# Patient Record
Sex: Female | Born: 1960
Health system: Southern US, Community
[De-identification: ages and names within clinical notes are randomized; demographics above are authoritative.]

## PROBLEM LIST (undated history)

## (undated) DIAGNOSIS — IMO0001 Reserved for inherently not codable concepts without codable children: Secondary | ICD-10-CM

## (undated) DIAGNOSIS — J019 Acute sinusitis, unspecified: Secondary | ICD-10-CM

## (undated) DIAGNOSIS — J301 Allergic rhinitis due to pollen: Secondary | ICD-10-CM

## (undated) DIAGNOSIS — Z0000 Encounter for general adult medical examination without abnormal findings: Secondary | ICD-10-CM

## (undated) DIAGNOSIS — E559 Vitamin D deficiency, unspecified: Secondary | ICD-10-CM

## (undated) DIAGNOSIS — R1013 Epigastric pain: Secondary | ICD-10-CM

## (undated) DIAGNOSIS — R5383 Other fatigue: Secondary | ICD-10-CM

## (undated) DIAGNOSIS — M2550 Pain in unspecified joint: Secondary | ICD-10-CM

## (undated) DIAGNOSIS — Z86010 Personal history of colonic polyps: Secondary | ICD-10-CM

## (undated) DIAGNOSIS — E669 Obesity, unspecified: Secondary | ICD-10-CM

## (undated) DIAGNOSIS — T7840XA Allergy, unspecified, initial encounter: Secondary | ICD-10-CM

## (undated) DIAGNOSIS — F32A Depression, unspecified: Secondary | ICD-10-CM

## (undated) DIAGNOSIS — J4530 Mild persistent asthma, uncomplicated: Secondary | ICD-10-CM

## (undated) DIAGNOSIS — N951 Menopausal and female climacteric states: Secondary | ICD-10-CM

## (undated) DIAGNOSIS — E28319 Asymptomatic premature menopause: Secondary | ICD-10-CM

## (undated) DIAGNOSIS — M79609 Pain in unspecified limb: Secondary | ICD-10-CM

## (undated) DIAGNOSIS — F329 Major depressive disorder, single episode, unspecified: Secondary | ICD-10-CM

## (undated) DIAGNOSIS — J069 Acute upper respiratory infection, unspecified: Secondary | ICD-10-CM

## (undated) DIAGNOSIS — J309 Allergic rhinitis, unspecified: Secondary | ICD-10-CM

## (undated) DIAGNOSIS — T781XXD Other adverse food reactions, not elsewhere classified, subsequent encounter: Secondary | ICD-10-CM

## (undated) DIAGNOSIS — E785 Hyperlipidemia, unspecified: Secondary | ICD-10-CM

## (undated) DIAGNOSIS — F331 Major depressive disorder, recurrent, moderate: Secondary | ICD-10-CM

## (undated) DIAGNOSIS — H1010 Acute atopic conjunctivitis, unspecified eye: Secondary | ICD-10-CM

## (undated) DIAGNOSIS — J45909 Unspecified asthma, uncomplicated: Secondary | ICD-10-CM

## (undated) DIAGNOSIS — M1990 Unspecified osteoarthritis, unspecified site: Secondary | ICD-10-CM

## (undated) DIAGNOSIS — M722 Plantar fascial fibromatosis: Secondary | ICD-10-CM

## (undated) DIAGNOSIS — J310 Chronic rhinitis: Secondary | ICD-10-CM

## (undated) DIAGNOSIS — H9311 Tinnitus, right ear: Secondary | ICD-10-CM

## (undated) DIAGNOSIS — R011 Cardiac murmur, unspecified: Secondary | ICD-10-CM

## (undated) DIAGNOSIS — M810 Age-related osteoporosis without current pathological fracture: Secondary | ICD-10-CM

## (undated) DIAGNOSIS — J988 Other specified respiratory disorders: Secondary | ICD-10-CM

## (undated) DIAGNOSIS — R42 Dizziness and giddiness: Secondary | ICD-10-CM

## (undated) HISTORY — DX: Menopausal and female climacteric states: N95.1

## (undated) HISTORY — DX: Mild persistent asthma, uncomplicated: J45.30

## (undated) HISTORY — DX: Encounter for general adult medical examination without abnormal findings: Z00.00

## (undated) HISTORY — DX: Tinnitus, right ear: H93.11

## (undated) HISTORY — DX: Allergic rhinitis, unspecified: J30.9

## (undated) HISTORY — PX: KNEE ARTHROPLASTY: SHX992

## (undated) HISTORY — DX: Chronic rhinitis: J31.0

## (undated) HISTORY — DX: Unspecified osteoarthritis, unspecified site: M19.90

## (undated) HISTORY — DX: Obesity, unspecified: E66.9

## (undated) HISTORY — DX: Plantar fascial fibromatosis: M72.2

## (undated) HISTORY — DX: Epigastric pain: R10.13

## (undated) HISTORY — DX: Other adverse food reactions, not elsewhere classified, subsequent encounter: T78.1XXD

## (undated) HISTORY — DX: Asymptomatic premature menopause: E28.319

## (undated) HISTORY — DX: Pain in unspecified joint: M25.50

## (undated) HISTORY — DX: Other specified respiratory disorders: J98.8

## (undated) HISTORY — DX: Other fatigue: R53.83

## (undated) HISTORY — DX: Acute upper respiratory infection, unspecified: J06.9

## (undated) HISTORY — DX: Cardiac murmur, unspecified: R01.1

## (undated) HISTORY — DX: Unspecified asthma, uncomplicated: J45.909

## (undated) HISTORY — DX: Major depressive disorder, single episode, unspecified: F32.9

## (undated) HISTORY — DX: Allergy, unspecified, initial encounter: T78.40XA

## (undated) HISTORY — DX: Acute atopic conjunctivitis, unspecified eye: H10.10

## (undated) HISTORY — DX: Major depressive disorder, recurrent, moderate: F33.1

## (undated) HISTORY — DX: Pain in unspecified limb: M79.609

## (undated) HISTORY — DX: Dizziness and giddiness: R42

## (undated) HISTORY — DX: Personal history of colonic polyps: Z86.010

## (undated) HISTORY — DX: Acute sinusitis, unspecified: J01.90

## (undated) HISTORY — DX: Reserved for inherently not codable concepts without codable children: IMO0001

## (undated) HISTORY — DX: Hyperlipidemia, unspecified: E78.5

## (undated) HISTORY — DX: Depression, unspecified: F32.A

## (undated) HISTORY — DX: Vitamin D deficiency, unspecified: E55.9

## (undated) HISTORY — DX: Age-related osteoporosis without current pathological fracture: M81.0

## (undated) HISTORY — DX: Allergic rhinitis due to pollen: J30.1

---

## 1982-06-27 HISTORY — PX: WISDOM TOOTH EXTRACTION: SHX21

## 1996-06-27 HISTORY — PX: KNEE SURGERY: SHX244

## 2003-08-01 ENCOUNTER — Other Ambulatory Visit: Admission: RE | Admit: 2003-08-01 | Discharge: 2003-08-01 | Payer: Self-pay | Admitting: *Deleted

## 2004-08-31 ENCOUNTER — Other Ambulatory Visit: Admission: RE | Admit: 2004-08-31 | Discharge: 2004-08-31 | Payer: Self-pay | Admitting: Obstetrics and Gynecology

## 2004-10-18 ENCOUNTER — Ambulatory Visit (HOSPITAL_COMMUNITY): Admission: RE | Admit: 2004-10-18 | Discharge: 2004-10-18 | Payer: Self-pay | Admitting: Obstetrics and Gynecology

## 2004-11-23 ENCOUNTER — Encounter: Admission: RE | Admit: 2004-11-23 | Discharge: 2004-11-23 | Payer: Self-pay | Admitting: Obstetrics and Gynecology

## 2008-01-09 ENCOUNTER — Encounter: Payer: Self-pay | Admitting: Family Medicine

## 2008-01-25 ENCOUNTER — Encounter: Payer: Self-pay | Admitting: Family Medicine

## 2008-01-25 ENCOUNTER — Encounter (INDEPENDENT_AMBULATORY_CARE_PROVIDER_SITE_OTHER): Payer: Self-pay | Admitting: *Deleted

## 2008-01-25 LAB — CONVERTED CEMR LAB
Free T4: 7.8 ng/dL
Glucose, Bld: 83 mg/dL
LDL Cholesterol: 24 mg/dL
TSH: 1.623 microintl units/mL
Triglycerides: 120 mg/dL

## 2008-07-09 ENCOUNTER — Ambulatory Visit: Payer: Self-pay | Admitting: Family Medicine

## 2008-07-09 DIAGNOSIS — N951 Menopausal and female climacteric states: Secondary | ICD-10-CM | POA: Insufficient documentation

## 2008-07-09 DIAGNOSIS — E669 Obesity, unspecified: Secondary | ICD-10-CM

## 2008-07-09 HISTORY — DX: Menopausal and female climacteric states: N95.1

## 2008-07-09 HISTORY — DX: Obesity, unspecified: E66.9

## 2008-07-16 ENCOUNTER — Encounter (INDEPENDENT_AMBULATORY_CARE_PROVIDER_SITE_OTHER): Payer: Self-pay | Admitting: *Deleted

## 2008-07-18 ENCOUNTER — Ambulatory Visit: Payer: Self-pay | Admitting: Family Medicine

## 2008-08-15 ENCOUNTER — Ambulatory Visit: Payer: Self-pay | Admitting: Family Medicine

## 2008-08-15 DIAGNOSIS — R109 Unspecified abdominal pain: Secondary | ICD-10-CM

## 2008-10-09 ENCOUNTER — Ambulatory Visit: Payer: Self-pay | Admitting: Family Medicine

## 2008-10-09 DIAGNOSIS — J309 Allergic rhinitis, unspecified: Secondary | ICD-10-CM | POA: Insufficient documentation

## 2008-10-09 DIAGNOSIS — J019 Acute sinusitis, unspecified: Secondary | ICD-10-CM

## 2008-10-09 DIAGNOSIS — J45909 Unspecified asthma, uncomplicated: Secondary | ICD-10-CM | POA: Insufficient documentation

## 2008-10-09 HISTORY — DX: Unspecified asthma, uncomplicated: J45.909

## 2008-10-09 HISTORY — DX: Allergic rhinitis, unspecified: J30.9

## 2008-10-27 ENCOUNTER — Ambulatory Visit: Payer: Self-pay | Admitting: Family Medicine

## 2008-10-31 ENCOUNTER — Ambulatory Visit: Payer: Self-pay | Admitting: Internal Medicine

## 2008-11-26 ENCOUNTER — Ambulatory Visit: Payer: Self-pay | Admitting: Family Medicine

## 2009-01-23 ENCOUNTER — Ambulatory Visit: Payer: Self-pay | Admitting: Family Medicine

## 2009-02-11 ENCOUNTER — Ambulatory Visit: Payer: Self-pay | Admitting: Family Medicine

## 2009-10-28 ENCOUNTER — Ambulatory Visit: Payer: Self-pay | Admitting: Family Medicine

## 2009-10-28 DIAGNOSIS — M79609 Pain in unspecified limb: Secondary | ICD-10-CM

## 2009-10-28 HISTORY — DX: Pain in unspecified limb: M79.609

## 2009-10-29 ENCOUNTER — Telehealth (INDEPENDENT_AMBULATORY_CARE_PROVIDER_SITE_OTHER): Payer: Self-pay | Admitting: *Deleted

## 2009-10-29 ENCOUNTER — Encounter (INDEPENDENT_AMBULATORY_CARE_PROVIDER_SITE_OTHER): Payer: Self-pay | Admitting: *Deleted

## 2009-10-29 LAB — CONVERTED CEMR LAB
Alkaline Phosphatase: 87 units/L (ref 39–117)
Bilirubin, Direct: 0.1 mg/dL (ref 0.0–0.3)
CO2: 29 meq/L (ref 19–32)
Chloride: 107 meq/L (ref 96–112)
Cholesterol: 221 mg/dL — ABNORMAL HIGH (ref 0–200)
Eosinophils Relative: 4.8 % (ref 0.0–5.0)
GFR calc non Af Amer: 94.57 mL/min (ref >60)
Glucose, Bld: 78 mg/dL (ref 70–99)
HCT: 38.8 % (ref 36.0–46.0)
MCV: 82.4 fL (ref 78.0–100.0)
Monocytes Absolute: 0.4 10*3/uL (ref 0.1–1.0)
Platelets: 389 10*3/uL (ref 150.0–400.0)
Potassium: 4.6 meq/L (ref 3.5–5.1)
RBC: 4.7 M/uL (ref 3.87–5.11)
RDW: 15.7 % — ABNORMAL HIGH (ref 11.5–14.6)
Sodium: 142 meq/L (ref 135–145)
Total Protein: 6.8 g/dL (ref 6.0–8.3)
VLDL: 20.6 mg/dL (ref 0.0–40.0)
WBC: 6.1 10*3/uL (ref 4.5–10.5)

## 2009-11-10 ENCOUNTER — Encounter (INDEPENDENT_AMBULATORY_CARE_PROVIDER_SITE_OTHER): Payer: Self-pay | Admitting: *Deleted

## 2009-11-12 ENCOUNTER — Ambulatory Visit: Payer: Self-pay | Admitting: Internal Medicine

## 2009-11-24 ENCOUNTER — Ambulatory Visit: Payer: Self-pay | Admitting: Family Medicine

## 2009-11-24 DIAGNOSIS — IMO0001 Reserved for inherently not codable concepts without codable children: Secondary | ICD-10-CM | POA: Insufficient documentation

## 2009-11-24 HISTORY — DX: Reserved for inherently not codable concepts without codable children: IMO0001

## 2009-11-26 ENCOUNTER — Ambulatory Visit: Payer: Self-pay | Admitting: Internal Medicine

## 2009-11-26 DIAGNOSIS — Z860100 Personal history of colon polyps, unspecified: Secondary | ICD-10-CM | POA: Insufficient documentation

## 2009-11-26 HISTORY — DX: Personal history of colonic polyps: Z86.010

## 2009-11-26 HISTORY — DX: Personal history of colon polyps, unspecified: Z86.0100

## 2009-11-28 ENCOUNTER — Encounter: Payer: Self-pay | Admitting: Internal Medicine

## 2009-12-14 ENCOUNTER — Telehealth (INDEPENDENT_AMBULATORY_CARE_PROVIDER_SITE_OTHER): Payer: Self-pay | Admitting: *Deleted

## 2009-12-17 ENCOUNTER — Ambulatory Visit: Payer: Self-pay | Admitting: Sports Medicine

## 2009-12-20 ENCOUNTER — Encounter: Payer: Self-pay | Admitting: Family Medicine

## 2010-07-27 NOTE — Letter (Signed)
Summary: [facility] Instructions  [facility]  [address] [place], [place] [address]   Phone: [phone]  Fax: [phone]       Tina Williams    Jul 26, 1960    MRN: [id number]        Procedure Day Tina Williams:  Tina Williams  11/26/09     Arrival Time:  9:00aam     Procedure Time:  10:00am     Location of Procedure:                    _X _  [facility] (4th Floor)                        PREPARATION FOR COLONOSCOPY WITH MOVIPREP   Starting 5 days prior to your procedure  SATURDAY 05/28  do not eat nuts, seeds, popcorn, corn, beans, peas,  salads, or any raw vegetables.  Do not take any fiber supplements (e.g. Metamucil, Citrucel, and Benefiber).  THE DAY BEFORE YOUR PROCEDURE         DATE: [facility] 06/01  1.  Drink clear liquids the entire day-NO SOLID FOOD  2.  Do not drink anything colored red or purple.  Avoid juices with pulp.  No orange juice.  3.  Drink at least 64 oz. (8 glasses) of fluid/clear liquids during the day to prevent dehydration and help the prep work efficiently.  CLEAR LIQUIDS INCLUDE: Water Jello Ice Popsicles Tea (sugar ok, no milk/cream) Powdered fruit flavored drinks Coffee (sugar ok, no milk/cream) Gatorade Juice: apple, white grape, white cranberry  Lemonade Clear bullion, consomm, broth Carbonated beverages (any kind) Strained chicken noodle soup Hard Candy                             4.  In the morning, mix first dose of MoviPrep solution:    Empty 1 Pouch A and 1 Pouch B into the disposable container    Add lukewarm drinking water to the top line of the container. Mix to dissolve    Refrigerate (mixed solution should be used within 24 hrs)  5.  Begin drinking the prep at 5:00 p.m. The MoviPrep container is divided by 4 marks.   Every 15 minutes drink the solution down to the next mark (approximately 8 oz) until the full liter is complete.   6.  Follow completed prep with 16 oz of clear liquid of your choice  (Nothing red or purple).  Continue to drink clear liquids until bedtime.  7.  Before going to bed, mix second dose of MoviPrep solution:    Empty 1 Pouch A and 1 Pouch B into the disposable container    Add lukewarm drinking water to the top line of the container. Mix to dissolve    Refrigerate  THE DAY OF YOUR PROCEDURE      DATE: THURSDAY  06/02  Beginning at 5:00 a.m. (5 hours before procedure):         1. Every 15 minutes, drink the solution down to the next mark (approx 8 oz) until the full liter is complete.  2. Follow completed prep with 16 oz. of clear liquid of your choice.    3. You may drink clear liquids until 8:00am (2 HOURS BEFORE PROCEDURE).   MEDICATION INSTRUCTIONS  Unless otherwise instructed, you should take regular prescription medications with a small sip of water   as early as possible the  morning of your procedure.           OTHER INSTRUCTIONS  You will need a responsible adult at least 50 years of age to accompany you and drive you home.   This person must remain in the waiting room during your procedure.  Wear loose fitting clothing that is easily removed.  Leave jewelry and other valuables at home.  However, you may wish to bring a book to read or  an iPod/MP3 player to listen to music as you wait for your procedure to start.  Remove all body piercing jewelry and leave at home.  Total time from sign-in until discharge is approximately 2-3 hours.  You should go home directly after your procedure and rest.  You can resume normal activities the  day after your procedure.  The day of your procedure you should not:   Drive   Make legal decisions   Operate machinery   Drink alcohol   Return to work  You will receive specific instructions about eating, activities and medications before you leave.    The above instructions have been reviewed and explained to me by Tina Bales RN  Nov 12, 2009 8:45 AM      I fully understand  and can verbalize these instructions _____________________________ Date _________

## 2010-07-27 NOTE — Progress Notes (Signed)
Summary: labs-lmomx2  Phone Note Outgoing Call   Call placed by: Doristine Devoid,  Oct 29, 2009 11:02 AM Call placed to: Patient Summary of Call: total cholesterol and LDL are both elevated.  please have pt start Simvastatin 20mg  at bedtime and recheck LFTs in 6-8 weeks  remainder of labs look great!  Follow-up for Phone Call        left message on machine .........Marland KitchenDoristine Devoid  Oct 29, 2009 11:02 AM   patient returned call left message on machine ........Marland KitchenDoristine Devoid  Oct 29, 2009 2:27 PM   Additional Follow-up for Phone Call Additional follow up Details #1::        Spoke with pt informed of labs and [facility] [place], [place]. lab appt scheduled .Kandice Hams  Oct 30, 2009 8:50 AM  Additional Follow-up by: Kandice Hams,  Oct 30, 2009 8:50 AM    New/Updated Medications: SIMVASTATIN 20 MG TABS (SIMVASTATIN) 1 by mouth at bedtime Prescriptions: SIMVASTATIN 20 MG TABS (SIMVASTATIN) 1 by mouth at bedtime  #30 x 2   Entered by:   Kandice Hams   Authorized by:   Neena Rhymes MD   Signed by:   Kandice Hams on 10/30/2009   Method used:   [facility] to ...       [facility]  [address] [phone][facility]ail[facility]       [address]       [place], [place]  [address]       Ph: [phone] or [phone]       Fax: [phone]   RxID:   [id number]

## 2010-07-27 NOTE — Progress Notes (Signed)
Summary: HA and nausea from med  Phone Note Call from Patient Call back at Home Phone [phone]   Caller: Patient-voicemail Summary of Call: patient left msg stating that since started cholesterol med has been having HA and nausea wanted to know if this is usually a side effect if so how long should she expect.  Initial call taken by: Doristine Devoid,  December 14, 2009 11:18 AM  Follow-up for Phone Call        spoke w/ patient says she has been having some intermittent and nausea w/ medication since starting today she feels worse has taken medication w/ food but still no improvement w/ symptoms should she stop or will symptoms resolve....Marland KitchenMarland KitchenDoristine Devoid  December 14, 2009 1:16 PM   Additional Follow-up for Phone Call Additional follow up Details #1::        pt can hold meds for 1 week and see if sxs improve.  if so, we will come up w/ alternative tx.  if sxs continue there is something else going on and pt will need appt. Additional Follow-up by: Neena Rhymes MD,  December 14, 2009 1:25 PM    Additional Follow-up for Phone Call Additional follow up Details #2::    spoke w/ patient aware of recommendations and will call back to let us know how symptoms are improving .....Marland KitchenMarland KitchenDoristine Devoid  December 14, 2009 4:23 PM

## 2010-07-27 NOTE — Assessment & Plan Note (Signed)
Summary: CPX AND FASTING LABS///SPH   Vital Signs:  Patient profile:   50 year old female Height:      60 inches Weight:      160 pounds BMI:     31.36 Pulse rate:   58 / minute BP sitting:   112 / 68  (left arm)  Vitals Entered By: Doristine Devoid (Oct 28, 2009 8:28 AM) CC: CPX AND LAB   History of Present Illness: 50 yo woman here today for CPE.  GYN- [place].  concerns-  1) family hx of colon cancer- younger sister recently dx'd w/ colon cancer.  2) pain on bottom of feet- pt reports 'knots' on the bottom of her feet.  most noticeable after wearing flip-flops.  Preventive Screening-Counseling & Management  Alcohol-Tobacco     Alcohol drinks/day: 0     Smoking Status: quit  Caffeine-Diet-Exercise     Does Patient Exercise: yes     Type of exercise: walking      Sexual History:  currently monogamous.        Drug Use:  never.    Problems Prior to Update: 1)  Physical Examination  (ICD-V70.0) 2)  Foot Pain, Bilateral  (ICD-729.5) 3)  Neoplasm, Malignant, Colon, Family Hx, Sibling  (ICD-V16.0) 4)  Rhinitis  (ICD-477.9) 5)  Sinusitis - Acute-nos  (ICD-461.9) 6)  Asthma  (ICD-493.90) 7)  Abdominal Pain Other Specified Site  (ICD-789.09) 8)  Uri  (ICD-465.9) 9)  Obesity  (ICD-278.00) 10)  Menopause, Early  (ICD-627.2)  Current Medications (verified): 1)  Fluticasone Propionate 50 Mcg/act  Susp (Fluticasone Propionate) .... 2 Sprays Each Nostril Once Daily 2)  Ventolin Hfa 108 (90 Base) Mcg/act Aers (Albuterol Sulfate) .... Use 4 Times Daily As Needed 3)  Symbicort 160-4.5 Mcg/act  Aero (Budesonide-Formoterol Fumarate) .... 2 Puffs First Thing  in Am and 2 Puffs Again in Pm About 12 Hours Later 4)  Zyrtec Allergy 10 Mg  Tabs (Cetirizine Hcl) .Marland Kitchen.. 1 Once Daily Prn  Allergies (verified): 1)  ! Pcn 2)  ! * Aleve 3)  ! Biaxin  Past History:  Past Medical History: Last updated: 10/31/2008 RHINITIS (ICD-477.9) SINUSITIS - ACUTE-NOS (ICD-461.9) ASTHMA (ICD-493.90)    - Spirometry 10/31/2008 FEV1 2.08 (89%),   ratio 71 ABDOMINAL PAIN OTHER SPECIFIED SITE (ICD-789.09) URI (ICD-465.9) OBESITY (ICD-278.00) MENOPAUSE, EARLY (ICD-627.2)  Past Surgical History: Last updated: 07/09/2008 Caesarean section right knee repair   Family History: Last updated: 10/31/2008 CAD-no HTN-mother DM-mother,2 brothers', sister STROKE-no COLON CA-no BREAST CA-no SPINAL CA-father deseased LUNG CA-sister deceased-smoker Emphysema- Father (smoker)  Social History: Last updated: 10/31/2008 married, mother of 3 boys Former smoker.  Quit in 2001.  Smoked 1/2 ppd x 18 years. [employer]  Social History: Smoking Status:  quit Does Patient Exercise:  yes Sexual History:  currently monogamous Drug Use:  never  Review of Systems  The patient denies anorexia, fever, weight loss, weight gain, vision loss, decreased hearing, hoarseness, chest pain, syncope, dyspnea on exertion, peripheral edema, prolonged cough, headaches, abdominal pain, melena, hematochezia, severe indigestion/heartburn, hematuria, suspicious skin lesions, depression, abnormal bleeding, enlarged lymph nodes, and breast masses.    Physical Exam  General:  Well-developed,well-nourished,in no acute distress; alert,appropriate and cooperative throughout examination Head:  Normocephalic and atraumatic without obvious abnormalities. No apparent alopecia or balding. Eyes:  No corneal or conjunctival inflammation noted. EOMI. Perrla. Funduscopic exam benign, without hemorrhages, exudates or papilledema. Vision grossly normal. Ears:  External ear exam shows no significant lesions or deformities.  Otoscopic examination reveals  clear canals, tympanic membranes are intact bilaterally without bulging, retraction, inflammation or discharge. Hearing is grossly normal bilaterally. Nose:  External nasal examination shows no deformity or inflammation. Nasal mucosa are pink and moist without lesions or exudates. Mouth:   Oral mucosa and oropharynx without lesions or exudates.  Teeth in good repair. Neck:  No deformities, masses, or tenderness noted. Breasts:  deferred to gyn Lungs:  Normal respiratory effort, chest expands symmetrically. Lungs are clear to auscultation, no crackles or wheezes. Heart:  normal rate, regular rhythm, and no murmur.   Abdomen:  Bowel sounds positive,abdomen soft and non-tender without masses, organomegaly or hernias noted. Genitalia:  deferred to gyn Msk:  No deformity or scoliosis noted of thoracic or lumbar spine.   Pulses:  +2 carotid, radial, DP Extremities:  No clubbing, cyanosis, edema, or deformity noted with normal full range of motion of all joints.  + TTP over plantar fascia bilaterally, L>R, w/ ? spurring vs tendon sheath enlargement Neurologic:  No cranial nerve deficits noted. Station and gait are normal. Plantar reflexes are down-going bilaterally. DTRs are symmetrical throughout. Sensory, motor and coordinative functions appear intact. Skin:  Intact without suspicious lesions or rashes Cervical Nodes:  No lymphadenopathy noted Psych:  Cognition and judgment appear intact. Alert and cooperative with normal attention span and concentration. No apparent delusions, illusions, hallucinations   Impression & Recommendations:  Problem # 1:  PHYSICAL EXAMINATION (ICD-V70.0) Assessment New pt's PE WNL.  check labs as below.  needs referral to GI given sister's recent CA dx.  anticipatory guidance provided. Orders: Venipuncture (22979) T-Vitamin D (25-Hydroxy) (89211-94174) TLB-Lipid Panel (80061-LIPID) TLB-BMP (Basic Metabolic Panel-BMET) (80048-METABOL) TLB-CBC Platelet - w/Differential (85025-CBCD) TLB-Hepatic/Liver Function Pnl (80076-HEPATIC) TLB-TSH (Thyroid Stimulating Hormone) (84443-TSH)  Problem # 2:  NEOPLASM, MALIGNANT, COLON, FAMILY HX, SIBLING (ICD-V16.0) Assessment: New younger sister recently dx'd.  refer for colonoscopy. Orders: Gastroenterology  Referral (GI)  Problem # 3:  FOOT PAIN, BILATERAL (ICD-729.5) Assessment: New pt w/ pain along plantar fascia bilaterally w/ either spurring or swelling.  painful for pt to walk in anything other than sneakers.  pt reports she is able to take ibuprofen but not Aleve.  encouraged NSAID use and ice.  showed pt how to stretch.  will refer to Sports Med for possible orthotics.  Pt expresses understanding and is in agreement w/ this plan. Orders: Orthopedic Referral (Ortho)  Complete Medication List: 1)  Fluticasone Propionate 50 Mcg/act Susp (Fluticasone propionate) .... 2 sprays each nostril once daily 2)  Ventolin Hfa 108 (90 Base) Mcg/act Aers (Albuterol sulfate) .... Use 4 times daily as needed 3)  Symbicort 160-4.5 Mcg/act Aero (Budesonide-formoterol fumarate) .... 2 puffs first thing  in am and 2 puffs again in pm about 12 hours later 4)  Zyrtec Allergy 10 Mg Tabs (Cetirizine hcl) .Marland Kitchen.. 1 once daily prn  Patient Instructions: 1)  Please schedule a follow-up appointment in 1 year or as needed. 2)  We'll notify you of your lab results 3)  Call with any questions or concerns 4)  Keep up the good work on diet and exercise 5)  Someone will call you with your Sports Med Referral and Colonoscopy referral 6)  HAPPY MOTHER'S DAY!!!

## 2010-07-27 NOTE — Assessment & Plan Note (Signed)
Summary: sinus infection//lch   Vital Signs:  Patient profile:   50 year old female Weight:      163 pounds Temp:     97.9 degrees F oral BP sitting:   114 / 60  (left arm)  Vitals Entered By: Doristine Devoid (Nov 24, 2009 11:40 AM) CC: sinus infection having some pressure and HA   History of Present Illness: 50 yo woman here today for ? sinus infxn.  sxs started as a cold last week, + low grade temp.  now w/ lots of PND, required albuterol over the weekend.  + body aches, HAs.  + facial pain/pressure, R>L maxillary.  + nausea from drainage.  no known sick contacts  myalgias- started last week.  stopped taking statin on Friday night.  muscle aches improved after stopping med.    Allergies (verified): 1)  ! Pcn 2)  ! * Aleve 3)  ! Biaxin  Review of Systems      See HPI  Physical Exam  General:  Well-developed,well-nourished,in no acute distress; alert,appropriate and cooperative throughout examination Head:  Normocephalic and atraumatic without obvious abnormalities. No apparent alopecia or balding.  + TTP over R maxillary sinus Eyes:  no injxn or inflammation Ears:  External ear exam shows no significant lesions or deformities.  Otoscopic examination reveals clear canals, tympanic membranes are intact bilaterally without bulging, retraction, inflammation or discharge. Hearing is grossly normal bilaterally. Nose:  External nasal examination shows no deformity or inflammation. Nasal mucosa are pink and moist without lesions or exudates. Mouth:  Oral mucosa and oropharynx without lesions or exudates.  Teeth in good repair.  + PND Lungs:  Normal respiratory effort, chest expands symmetrically. Lungs are clear to auscultation, no crackles or wheezes. Heart:  normal rate, regular rhythm, and no murmur.     Impression & Recommendations:  Problem # 1:  SINUSITIS - ACUTE-NOS (ICD-461.9) Assessment Unchanged pt w/ R maxillary sinusitis.  start Avelox.  reviewed supportive care and  red flags that should prompt return.  Pt expresses understanding and is in agreement w/ this plan. Her updated medication list for this problem includes:    Fluticasone Propionate 50 Mcg/act Susp (Fluticasone propionate) .Marland Kitchen... 2 sprays each nostril once daily    Avelox 400 Mg Tabs (Moxifloxacin hcl) .Marland Kitchen... 1 tablet by mouth daily  Problem # 2:  MYALGIA (ICD-729.1) Assessment: New ? statin related.  pt to start samples of Crestor 5mg  after completing abx.  if she again develops sxs she is to call for lab appt to check CK.  will follow.  Complete Medication List: 1)  Fluticasone Propionate 50 Mcg/act Susp (Fluticasone propionate) .... 2 sprays each nostril once daily 2)  Ventolin Hfa 108 (90 Base) Mcg/act Aers (Albuterol sulfate) .... Use 4 times daily as needed 3)  Symbicort 160-4.5 Mcg/act Aero (Budesonide-formoterol fumarate) .... 2 puffs first thing  in am and 2 puffs again in pm about 12 hours later 4)  Zyrtec Allergy 10 Mg Tabs (Cetirizine hcl) .Marland Kitchen.. 1 once daily prn 5)  Simvastatin 20 Mg Tabs (Simvastatin) .Marland Kitchen.. 1 by mouth at bedtime 6)  Moviprep 100 Gm Solr (Peg-kcl-nacl-nasulf-na asc-c) .... As per prep instructions. 7)  Avelox 400 Mg Tabs (Moxifloxacin hcl) .Marland Kitchen.. 1 tablet by mouth daily  Patient Instructions: 1)  STOP the simvastatin 2)  Take the Avelox daily as directed 3)  Tyleno/Ibuprofen for pain or fever 4)  Mucinex as needed to thin your congestion (the plain not the D form) 5)  Once you finish your  antibiotics start the Crestor samples- 1 daily 6)  If you develop muscle aches on the Crestor- call and get a lab appt 7)  Hang in there!! Prescriptions: AVELOX 400 MG  TABS (MOXIFLOXACIN HCL) 1 tablet by mouth daily  #10 x 0   Entered and Authorized by:   Neena Rhymes MD   Signed by:   Neena Rhymes MD on 11/24/2009   Method used:   [facility]  [address] [phone]       [address]       [place], [place]  [address]       Ph:  [phone] or [phone]       Fax: [phone]   RxID:   [id number]

## 2010-07-27 NOTE — Letter (Signed)
Summary: Previsit letter  [facility]  [address] [place], [place] [address]   Phone: [phone]  Fax: [phone]       10/29/2009 MRN: [id number]  [facility] [place] [address] [place], [place]  [address]  Dear Ms. Bettenhausen,  Welcome to the Gastroenterology Division at [facility].    You are scheduled to see a nurse for your pre-procedure visit on 11-12-09 at 8:30a.m. on the 3rd floor at [facility], [address]  We ask that you try to arrive at our office 15 minutes prior to your appointment time to allow for check-in.  Your nurse visit will consist of discussing your medical and surgical history, your immediate family medical history, and your medications.    Please bring a complete list of all your medications or, if you prefer, bring the medication bottles and we will list them.  We will need to be aware of both prescribed and over the counter drugs.  We will need to know exact dosage information as well.  If you are on blood thinners (Coumadin, Plavix, Aggrenox, Ticlid, etc.) please call our office today/prior to your appointment, as we need to consult with your physician about holding your medication.   Please be prepared to read and sign documents such as consent forms, a financial agreement, and acknowledgement forms.  If necessary, and with your consent, a friend or relative is welcome to sit-in on the nurse visit with you.  Please bring your insurance card so that we may make a copy of it.  If your insurance requires a referral to see a specialist, please bring your referral form from your primary care physician.  No co-pay is required for this nurse visit.     If you cannot keep your appointment, please call [phone] to cancel or reschedule prior to your appointment date.  This allows [place] the opportunity to schedule an appointment for another patient in need of care.    Thank you for choosing [facility] for your medical needs.   We appreciate the opportunity to care for you.  Please visit [place] at our website  to learn more about our practice.                     Sincerely.                                                                                                                   The Gastroenterology Division

## 2010-07-27 NOTE — Procedures (Signed)
Summary: Colonoscopy  Patient: Tina Williams Note: All result statuses are Final unless otherwise noted.  Tests: (1) Colonoscopy (COL)   COL Colonoscopy           DONE     [facility]     [address]     [place], [place]  [address]           COLONOSCOPY PROCEDURE REPORT           PATIENT:  Tina Williams, Tina Williams  MR#:  [id number]     BIRTHDATE:  05-16-1961, 48 yrs. old  GENDER:  female     ENDOSCOPIST:  Iva Boop, MD, [facility]     REF. BY:  Helane Rima. Beverely Low, M.D.     PROCEDURE DATE:  11/26/2009     PROCEDURE:  Colonoscopy with snare polypectomy     ASA CLASS:  Class II     INDICATIONS:  Elevated Risk Screening, family history of colon     cancer sister diagnosed (recently) at 49 years     MEDICATIONS:   Fentanyl 50 mcg IV, Versed 7 mg IV           DESCRIPTION OF PROCEDURE:   After the risks benefits and     alternatives of the procedure were thoroughly explained, informed     consent was obtained.  Digital rectal exam was performed and     revealed no abnormalities.   The LB [id number]-[id number] endoscope     was introduced through the anus and advanced to the cecum, which     was identified by both the appendix and ileocecal valve, without     limitations.  The quality of the prep was excellent, using     MoviPrep.  The instrument was then slowly withdrawn as the colon     was fully examined. Insertion: 3:32 minutes Withdrawal: 9:21     minutes     <<PROCEDUREIMAGES>>           FINDINGS:  A sessile polyp was found in the descending colon. It     was 8 mm in size. Polyp was snared without cautery. Retrieval was     successful. snare polyp  Melanosis coli was found throughout the     colon.  This was otherwise a normal examination of the colon.     including right colon retroflexion   Retroflexed views in the     rectum revealed internal hemorrhoids.    The scope was then     withdrawn from the patient and the procedure completed.           COMPLICATIONS:  None  ENDOSCOPIC IMPRESSION:     1) 8 mm sessile polyp in the descending colon     2) Melanosis throughout the colon     3) Otherwise normal examination, excellent prep     4) Internal hemorrhoids in the rectum     RECOMMENDATIONS:     Siblings 32 and older should have a colonoscopy     Parents also if that is applicable           REPEAT EXAM:  In for Colonoscopy, pending biopsy results. consider     closer follow-up given family history           Iva Boop, MD, [facility]           CC:  Sheliah Hatch, MD and The Patient           n.  eSIGNED:   Iva Boop at 11/26/2009 11:04 AM           Josph Macho, [id number]  Note: An exclamation mark (!) indicates a result that was not dispersed into the flowsheet. Document Creation Date: 11/26/2009 11:05 AM _______________________________________________________________________  (1) Order result status: Final Collection or observation date-time: 11/26/2009 10:50 Requested date-time:  Receipt date-time:  Reported date-time:  Referring Physician:   Ordering Physician: Stan Head [phone]) Specimen Source:  Source: Launa Grill Order Number: [id number] Lab site:

## 2010-07-27 NOTE — Letter (Signed)
Summary: *Consult Note  [facility]  [address]   [place], [place] [address]   Phone: [phone]  Fax: [phone]    Re:    Tina Williams DOB:    [birth year]   Dear Dr. Beverely Low:    Thank you for requesting that we see the above patient for consultation.  A copy of the detailed office note will be sent under separate cover, for your review.  Evaluation today is consistent with:  Plantar fibromatosis/fasciitis  Our recommendation is for:  Exercises/stretches Icing Comforthotics sanded down on plantar surface to allow fibroma in left arch to drop down into the area - felt comfortable/less painful in these. Tylenol as needed Follow up in 6 weeks - discussed possible injection of fibroma if not improving.   After today's visit, the patients current medications include: 1)  FLUTICASONE PROPIONATE 50 MCG/ACT  SUSP (FLUTICASONE PROPIONATE) 2 sprays each nostril once daily 2)  VENTOLIN HFA 108 (90 BASE) MCG/ACT AERS (ALBUTEROL SULFATE) use 4 times daily as needed 3)  SYMBICORT 160-4.5 MCG/ACT  AERO (BUDESONIDE-FORMOTEROL FUMARATE) 2 puffs first thing  in am and 2 puffs again in pm about 12 hours later 4)  ZYRTEC ALLERGY 10 MG  TABS (CETIRIZINE HCL) 1 once daily prn 5)  SIMVASTATIN 20 MG TABS (SIMVASTATIN) 1 by mouth at bedtime   Thank you for this consultation.  If you have any further questions regarding the care of this patient, please do not hesitate to contact me @ [phone].  Thank you for this opportunity to look after your patient.  Sincerely,   Norton Blizzard MD

## 2010-07-27 NOTE — Letter (Signed)
Summary: Patient Notice- Polyp Results  [facility]  [address] [place], [place] [address]   Phone: [phone]  Fax: [phone]        November 28, 2009 MRN: [id number]    [facility] [place] [address] [place], [place]  [address]    Dear Tina Williams,  The polyp removed from your colon was adenomatous. This means that it was pre-cancerous or that  it had the potential to change into cancer over time.   I recommend that you have a repeat colonoscopy in 3 years to determine if you have developed any new polyps over time. If you develop any new rectal bleeding, abdominal pain or significant bowel habit changes, please contact us before then.   if you are able, please tell your siblings that they should have a colonoscopy at age 75 or older, based upon your sister's history of colon cancer (found at age 18). If I can help with that please let me know.  It would also make sense for your sister (and possibly you, your siblings) to have genetic analysis of her colon cancer and blood testing to see if there are abnormal genes present. This could provide important information about her, you and your family's future testing to prevent colon cancer. The best method to accomplish this would be for her to discuss with her oncologist and to see a [place].  Please call us if you are having persistent problems or have questions about your condition that have not been fully answered at this time.   Sincerely,  Iva Boop MD, [facility]  This letter has been electronically signed by your physician.  Appended Document: Patient Notice- Polyp Results letter mailed.

## 2010-07-27 NOTE — Miscellaneous (Signed)
Summary: [facility] previsit  Clinical Lists Changes  Medications: Added new medication of MOVIPREP 100 GM  SOLR (PEG-KCL-NACL-NASULF-NA ASC-C) As per prep instructions. - Signed Rx of MOVIPREP 100 GM  SOLR (PEG-KCL-NACL-NASULF-NA ASC-C) As per prep instructions.;  #1 x 0;  Signed;  Entered by: [name];  Authorized by: Iva Boop MD, FACG;  Method used: Electronically to [facility]  [address] [phone]*, [address] [place], [place], [place]  [address], Ph: [phone] or [phone], Fax: [phone]    Prescriptions: MOVIPREP 100 GM  SOLR (PEG-KCL-NACL-NASULF-NA ASC-C) As per prep instructions.  #1 x 0   Entered by:   [name]   Authorized by:   Iva Boop MD, [facility]   Signed by:   [name] on 11/12/2009   Method used:   Electronically to        [facility]  [address] [phone]* (retail)       [address]       [place], [place]  [address]       Ph: [phone] or [phone]       Fax: [phone]   RxID:   [id number]

## 2010-07-27 NOTE — Assessment & Plan Note (Signed)
Summary: NP,BILATERAL FOOT PAIN,MC   Vital Signs:  Patient profile:   50 year old female BP sitting:   120 / 86  Vitals Entered By: Lillia Pauls CMA (December 17, 2009 10:23 AM)  Primary Care Provider:  Dr. Beverely Low   History of Present Illness: 50 yo F here for bilateral arch pain  Patient reports about 8 months of bilateral L > R plantar foot pain No known injury Back in the wintertime she noticed a painful nodule within arch of left foot. Hurts more with flat shoes, sandals, walking barefoot. Has tried frozen water bottle and feels better when doing this Also better in her tennis shoes Has not tried any inserts to help. No other prior treatment, exercises, stretches.  Allergies (verified): 1)  ! Pcn 2)  ! * Aleve 3)  ! Biaxin  Physical Exam  General:  Well-developed,well-nourished,in no acute distress; alert,appropriate and cooperative throughout examination Msk:  Normal inspection with no visible or palpable fat pad atrophy and no visible swelling/erythema. Patient is tender at mid-portion of plantar fascia.  Noted to have a palpable painful nodule here on left but not on right. Great toe motion: full Arch shape: well preserved Other foot breakdown: No significant callus/transverse arch breakdown. Heel cords tight.    Impression & Recommendations:  Problem # 1:  FOOT PAIN, BILATERAL (ICD-729.5) Assessment Deteriorated  2/2 plantar fibromatosis and fasciitis.  Discussed options: conservative treatment with standard plantar fasciitis protocol vs injection of nodule on left.  Patient would like to try conservative route first - will trial for 6 weeks.  Icing, eccentrics/stretches, comforthotics with heel lifts, tylenol (allergic to aleve) as needed.  Avoid arch straps given fibromatosis and nodule center of arch esp on left.  F/u 6 weeks for recheck.  Orders: [facility] [phone])  Complete Medication List: 1)  Fluticasone Propionate 50 Mcg/act Susp (Fluticasone  propionate) .... 2 sprays each nostril once daily 2)  Ventolin Hfa 108 (90 Base) Mcg/act Aers (Albuterol sulfate) .... Use 4 times daily as needed 3)  Symbicort 160-4.5 Mcg/act Aero (Budesonide-formoterol fumarate) .... 2 puffs first thing  in am and 2 puffs again in pm about 12 hours later 4)  Zyrtec Allergy 10 Mg Tabs (Cetirizine hcl) .Marland Kitchen.. 1 once daily prn 5)  Simvastatin 20 Mg Tabs (Simvastatin) .Marland Kitchen.. 1 by mouth at bedtime  Patient Instructions: 1)  You have plantar fasciitis with plantar fibromatosis (nodule in middle of left foot) 2)  Tylenol as needed for pain 3)  Plantar fascia stretch for 20-30 seconds in morning 4)  Lowering/raise on a step exercises 3 x 15 once or twice a day 5)  Can add heel walks, toe walks forward and backward as well 6)  Ice bucket 10-15 minutes at end of day 7)  Avoid flat shoes/barefoot walking as much as possible. 8)  Arch straps will probably push on these areas more for you so I wouldn't recommend them at this time. 9)  Heel lifts will help - I added some lift to your inserts 10)  Follow up with [place] in 6 weeks.  If you are not improving, I would strongly consider injecting the nodule in your left foot.  Appended Document: NP,BILATERAL FOOT PAIN,MC Also note that patient's comforthotics were altered - marked area of fibroma on left side and ground down on shoe side of comforthotic in this area only to allow fibroma to fall into divot - felt comfortable in the office.

## 2010-07-27 NOTE — Letter (Signed)
Summary: Primary Care Consult Scheduled Letter  [facility]  [address] [place], [place] [address]   Phone: [phone]  Fax: [phone]      10/29/2009 MRN: [id number]  [facility] [address] [place], [place]  [address]    Dear Ms. Heidler,    We have scheduled an appointment for you.  At the recommendation of Dr. Neena Rhymes, we have scheduled you for a Screening Colonoscopy with Dr. Stan Head of [facility].  Your initial Pre-Visit with a Nurse is on 11-12-2009 at 8:30am.  Your Colonoscopy is on 11-26-2009 arrive by 9:00am.  Their address is [address], [place] [address]. The office phone number is [phone].  If this appointment day and time is not convenient for you, please feel free to call the office of the doctor you are being referred to at the number listed above and reschedule the appointment.    It is important for you to keep your scheduled appointments. We are here to make sure you are given good patient care.   Thank you,    Renee, Patient Care Coordinator [place] [facility]

## 2010-09-24 ENCOUNTER — Encounter: Payer: Self-pay | Admitting: Internal Medicine

## 2010-09-27 ENCOUNTER — Encounter: Payer: Self-pay | Admitting: Internal Medicine

## 2010-09-27 ENCOUNTER — Ambulatory Visit (INDEPENDENT_AMBULATORY_CARE_PROVIDER_SITE_OTHER): Payer: BC Managed Care – PPO | Admitting: Internal Medicine

## 2010-09-27 DIAGNOSIS — J45909 Unspecified asthma, uncomplicated: Secondary | ICD-10-CM

## 2010-09-27 MED ORDER — BUDESONIDE-FORMOTEROL FUMARATE 160-4.5 MCG/ACT IN AERO: 2.0000 | INHALATION_SPRAY | Freq: Two times a day (BID) | RESPIRATORY_TRACT | Status: DC

## 2010-09-27 NOTE — Patient Instructions (Signed)
Work on inhaler technique:  relax and gently blow all the way out then take a nice smooth deep breath back in, triggering the inhaler at same time you start breathing in.  Hold for up to 5 seconds if you can.  Rinse and gargle with water when done   If your mouth or throat starts to bother you,   I suggest you time the inhaler to your dental care and after using the inhaler(s) brush teeth and tongue with a baking soda containing toothpaste and when you rinse this out, gargle with it first to see if this helps your mouth and throat.    Ok to use symbicort 80  2 puffs every 12 hours as needed   If your breathing worsens or you need to use your rescue inhaler more than twice weekly or wake up more than twice a month with any respiratory symptoms or require more than two rescue inhalers per year, we need to see you right away because this means we're not controlling the underlying problem (inflammation) adequately.  Rescue inhalers do not control inflammation and overuse can lead to unnecessary and costly consequences.  They can make you feel better temporarily but eventually they will quit working effectively much as sleep aids lead to more insomnia if used regularly.

## 2010-09-27 NOTE — Progress Notes (Signed)
  Subjective:    Patient ID: Tina Williams, female    DOB: [birth year], 50 y.o.   MRN: [id number]  HPI  Primary Provider/Referring Provider: Dr. Beverely Low  CC: Asthma.   89 yowm with asthma since age 52 worse with smoking stopped in 2001 and some better since, she describes as seasonal only.   Oct 31, 2008 ov cc: worst spring ever. when better all she takes is occ zyrtec, but for last month restarted singular and pulmicort with prn saba and now feels she has it back under control.   Previously rx by [employer] with advair and tried shots > no help.   09/27/2010  Ov much better as long as on symbicort, worse since ran out and now needing saba again. No purulent sputum, no noct events.   Pt denies any significant sore throat, dysphagia, itching, sneezing,  nasal congestion or excess/ purulent secretions,  fever, chills, sweats, unintended wt loss, pleuritic or exertional cp, hempoptysis, orthopnea pnd or leg swelling.    Also denies any obvious fluctuation of symptoms with weather or environmental changes or other aggravating or alleviating factors.     Allergies:  1) ! Pcn  2) ! * Aleve  3) ! Biaxin   Past Medical History:  RHINITIS (ICD-477.9)  SINUSITIS - ACUTE-NOS (ICD-461.9)  ASTHMA (ICD-493.90)  - Spirometry 10/31/2008 FEV1 2.08 (89%), ratio 71  - HFA 75%  09/27/10 ABDOMINAL PAIN OTHER SPECIFIED SITE (ICD-789.09)  OBESITY (ICD-278.00)  MENOPAUSE, EARLY (ICD-627.2)    Family History:  CAD-no  HTN-mother  DM-mother,2 brothers', sister  STROKE-no  COLON CA-no  BREAST CA-no  SPINAL CA-father deseased  LUNG CA-sister deceased-smoker  Emphysema- Father (smoker)   Social History:  married, mother of 3 boys  Former smoker. Quit in 2001. Smoked 1/2 ppd x 18 years.  [employer]      2) Ventolin Hfa 108 (90 Base) Mcg/act Aers (Albuterol sulfate) .... Use 4 times daily as needed  3) Singulair 10 Mg Tabs (Montelukast sodium) .Marland Kitchen.. 1 by mouth daily  4) Pulmicort Flexhaler 90 Mcg/act  Aepb (Budesonide) .... 2 puffs two times a day  5) Symbicort 160-4.5 Mcg/act Aero (Budesonide-formoterol fumarate) .... 2 puffs first thing in am and 2 puffs again in pm about 12 hours later  Other Orders:     Review of Systems     Objective:   Physical Exam   wt 167 > 168 Nov 01, 2008 >   174 09/27/10 amb wf nad  HEENT: eyes slt injected conjunctiva, mod nonspecific swelling of turbinates, and nl dentition and orophanx. Nl external ear canals without cough reflex  NECK : without JVD/Nodes/TM/ nl carotid upstrokes bilaterally  LUNGS: no acc muscle use, clear to A and P bilaterally without cough on insp or exp maneuvers  CV: RRR no s3 or murmur or increase in P2, no edema  ABD: soft and nontender with nl excursion in the supine position. No bruits or organomegaly, bowel sounds nl  MS: warm without deformities, calf tenderness, cyanosis or clubbing  SKIN: warm and dry without lesions     Assessment & Plan:

## 2010-09-28 ENCOUNTER — Encounter: Payer: Self-pay | Admitting: Internal Medicine

## 2010-09-28 NOTE — Assessment & Plan Note (Signed)
All goals of chronic asthma control met including optimal function and elimination of symptoms with minimal need for rescue therapy as long as has access to symbicort  Contingencies discussed in full including contacting this office immediately if not controlling the symptoms using the rule of two's.   The proper method of use, as well as anticipated side effects, of this metered-dose inhaler are discussed and demonstrated to the patient.  She approached 75% effectiveness p extensive coaching

## 2011-03-23 ENCOUNTER — Ambulatory Visit (INDEPENDENT_AMBULATORY_CARE_PROVIDER_SITE_OTHER): Payer: BC Managed Care – PPO | Admitting: Family Medicine

## 2011-03-23 ENCOUNTER — Encounter: Payer: Self-pay | Admitting: Family Medicine

## 2011-03-23 DIAGNOSIS — E785 Hyperlipidemia, unspecified: Secondary | ICD-10-CM | POA: Insufficient documentation

## 2011-03-23 DIAGNOSIS — Z0000 Encounter for general adult medical examination without abnormal findings: Secondary | ICD-10-CM

## 2011-03-23 HISTORY — DX: Encounter for general adult medical examination without abnormal findings: Z00.00

## 2011-03-23 NOTE — Assessment & Plan Note (Signed)
Pt's PE WNL.  UTD on GYN screening, colonoscopy.  Check labs.  Anticipatory guidance provided.

## 2011-03-23 NOTE — Assessment & Plan Note (Signed)
Pt was unable to tolerate Simvastatin or Crestor.  Trying to work on diet and exercise.  Check labs and start meds prn.

## 2011-03-23 NOTE — Progress Notes (Signed)
  Subjective:    Patient ID: Tina Williams, female    DOB: September 11, 1960, 50 y.o.   MRN: [id number]  HPI CPE- GYN- [place] (UTD- on pap and mammo).  UTD on colonoscopy.   Review of Systems Patient reports no vision/ hearing  changes, adenopathy,fever, weight change,  persistant / recurrent hoarseness , swallowing issues, chest pain,palpitations,edema,persistant /recurrent cough, hemoptysis, dyspnea( rest/ exertional/paroxysmal nocturnal), gastrointestinal bleeding(melena, rectal bleeding), abdominal pain, significant heartburn bowel changes,GU symptoms(dysuria, hematuria,pyuria, incontinence) ), Gyn symptoms(abnormal  bleeding , pain),  syncope, focal weakness, memory loss,numbness & tingling, skin/hair /nail changes,abnormal bruising or bleeding, anxiety,or depression.     Objective:   Physical Exam  General Appearance:    Alert, cooperative, no distress, appears stated age  Head:    Normocephalic, without obvious abnormality, atraumatic  Eyes:    PERRL, conjunctiva/corneas clear, EOM's intact, fundi    benign, both eyes  Ears:    Normal TM's and external ear canals, both ears  Nose:   Nares normal, septum midline, mucosa normal, no drainage    or sinus tenderness  Throat:   Lips, mucosa, and tongue normal; teeth and gums normal  Neck:   Supple, symmetrical, trachea midline, no adenopathy;    Thyroid: no enlargement/tenderness/nodules  Back:     Symmetric, no curvature, ROM normal, no CVA tenderness  Lungs:     Clear to auscultation bilaterally, respirations unlabored  Chest Wall:    No tenderness or deformity   Heart:    Regular rate and rhythm, S1 and S2 normal, no murmur, rub   or gallop  Breast Exam:    Deferred to GYN  Abdomen:     Soft, non-tender, bowel sounds active all four quadrants,    no masses, no organomegaly  Genitalia:    Deferred to GYN  Rectal:    Extremities:   Extremities normal, atraumatic, no cyanosis or edema  Pulses:   2+ and symmetric all extremities  Skin:    Skin color, texture, turgor normal, no rashes or lesions  Lymph nodes:   Cervical, supraclavicular, and axillary nodes normal  Neurologic:   CNII-XII intact, normal strength, sensation and reflexes    throughout          Assessment & Plan:

## 2011-03-23 NOTE — Patient Instructions (Signed)
We'll notify you of your lab results Keep up the good work on diet and exercise! Call with any questions or concerns Happy Fall!!!

## 2011-03-24 LAB — CBC WITH DIFFERENTIAL/PLATELET
Basophils Absolute: 0 10*3/uL (ref 0.0–0.1)
Eosinophils Relative: 4.1 % (ref 0.0–5.0)
Lymphocytes Relative: 32.5 % (ref 12.0–46.0)
Lymphs Abs: 2 10*3/uL (ref 0.7–4.0)
MCHC: 31.6 g/dL (ref 30.0–36.0)
MCV: 84.6 fl (ref 78.0–100.0)
Monocytes Relative: 9.5 % (ref 3.0–12.0)
Neutro Abs: 3.3 10*3/uL (ref 1.4–7.7)
Neutrophils Relative %: 53.6 % (ref 43.0–77.0)
Platelets: 353 10*3/uL (ref 150.0–400.0)
RDW: 15.5 % — ABNORMAL HIGH (ref 11.5–14.6)

## 2011-03-25 LAB — LDL CHOLESTEROL, DIRECT: Direct LDL: 161.6 mg/dL

## 2011-03-25 LAB — HEPATIC FUNCTION PANEL
ALT: 18 U/L (ref 0–35)
Albumin: 4.5 g/dL (ref 3.5–5.2)
Alkaline Phosphatase: 79 U/L (ref 39–117)
Bilirubin, Direct: 0.1 mg/dL (ref 0.0–0.3)
Total Protein: 7.4 g/dL (ref 6.0–8.3)

## 2011-03-25 LAB — BASIC METABOLIC PANEL
Calcium: 9.1 mg/dL (ref 8.4–10.5)
GFR: 100.64 mL/min (ref >60.00)
Potassium: 4.8 mEq/L (ref 3.5–5.1)
Sodium: 142 mEq/L (ref 135–145)

## 2011-03-25 LAB — TSH: TSH: 1.01 u[IU]/mL (ref 0.35–5.50)

## 2011-03-26 LAB — VITAMIN D 1,25 DIHYDROXY: Vitamin D 1, 25 (OH)2 Total: 55 pg/mL (ref 18–72)

## 2011-03-28 NOTE — Progress Notes (Signed)
Quick Note:  Pt aware of lab results. ______ 

## 2011-03-28 NOTE — Progress Notes (Signed)
Quick Note:  Pt aware ______ 

## 2011-11-17 ENCOUNTER — Other Ambulatory Visit: Payer: Self-pay | Admitting: Internal Medicine

## 2011-11-24 ENCOUNTER — Ambulatory Visit (INDEPENDENT_AMBULATORY_CARE_PROVIDER_SITE_OTHER): Payer: BC Managed Care – PPO | Admitting: Family Medicine

## 2011-11-24 ENCOUNTER — Encounter: Payer: Self-pay | Admitting: Family Medicine

## 2011-11-24 VITALS — BP 126/78 | HR 67 | Temp 97.8°F | Wt 173.0 lb

## 2011-11-24 DIAGNOSIS — J019 Acute sinusitis, unspecified: Secondary | ICD-10-CM

## 2011-11-24 MED ORDER — DOXYCYCLINE HYCLATE 100 MG PO TABS: 100.0000 mg | ORAL_TABLET | Freq: Two times a day (BID) | ORAL | Status: DC

## 2011-11-24 NOTE — Assessment & Plan Note (Signed)
Pt's sxs and PE consistent w/ infxn.  Start doxy due to multiple allergies.  Reviewed supportive care and red flags that should prompt return.  Pt expressed understanding and is in agreement w/ plan.

## 2011-11-24 NOTE — Progress Notes (Signed)
  Subjective:    Patient ID: Tina Williams, female    DOB: [birth year], 51 y.o.   MRN: [id number]  HPI URI- sxs started 6 weeks ago w/ allergy sxs but no relief w/ OTC antihistamine.  Developed facial pain in the last 2-3 days.  No fevers.  + R ear pain.  + tooth pain.  + PND, nasal congestion, sore throat.  Mild cough.  Mild dizziness, some ringing in the ears.  No known sick contacts.  Multiple drug allergies.   Review of Systems For ROS see HPI     Objective:   Physical Exam  Vitals reviewed. Constitutional: She appears well-developed and well-nourished. No distress.  HENT:  Head: Normocephalic and atraumatic.  Right Ear: Tympanic membrane normal.  Left Ear: Tympanic membrane normal.  Nose: Mucosal edema and rhinorrhea present. Right sinus exhibits maxillary sinus tenderness and frontal sinus tenderness. Left sinus exhibits maxillary sinus tenderness and frontal sinus tenderness.  Mouth/Throat: Uvula is midline and mucous membranes are normal. Posterior oropharyngeal erythema present. No oropharyngeal exudate.  Eyes: Conjunctivae and EOM are normal. Pupils are equal, round, and reactive to light.  Neck: Normal range of motion. Neck supple.  Cardiovascular: Normal rate, regular rhythm and normal heart sounds.   Pulmonary/Chest: Effort normal and breath sounds normal. No respiratory distress. She has no wheezes.  Lymphadenopathy:    She has no cervical adenopathy.          Assessment & Plan:

## 2011-11-24 NOTE — Patient Instructions (Signed)
This appears to be a sinus infection Start the Doxy twice daily- take w/ food Drink plenty of fluids REST! Call with any questions or concerns Happy Early Birthday!!!

## 2011-12-02 ENCOUNTER — Other Ambulatory Visit: Payer: Self-pay | Admitting: *Deleted

## 2011-12-02 MED ORDER — BUDESONIDE-FORMOTEROL FUMARATE 160-4.5 MCG/ACT IN AERO: 2.0000 | INHALATION_SPRAY | Freq: Two times a day (BID) | RESPIRATORY_TRACT | Status: DC

## 2011-12-02 NOTE — Telephone Encounter (Signed)
Pt left message advising that she needs refill on symbicort and pulmonologist no longer taking care of her at this point thus needs MD Tabori to fill inhaler rx, sent RX via escribe

## 2012-10-25 ENCOUNTER — Encounter: Payer: Self-pay | Admitting: *Deleted

## 2012-10-25 ENCOUNTER — Encounter: Payer: Self-pay | Admitting: Family Medicine

## 2012-10-25 ENCOUNTER — Ambulatory Visit (INDEPENDENT_AMBULATORY_CARE_PROVIDER_SITE_OTHER): Payer: BC Managed Care – PPO | Admitting: Family Medicine

## 2012-10-25 VITALS — BP 100/80 | HR 74 | Temp 98.9°F | Ht 61.0 in | Wt 168.8 lb

## 2012-10-25 DIAGNOSIS — M2550 Pain in unspecified joint: Secondary | ICD-10-CM

## 2012-10-25 DIAGNOSIS — M25539 Pain in unspecified wrist: Secondary | ICD-10-CM

## 2012-10-25 DIAGNOSIS — Z0000 Encounter for general adult medical examination without abnormal findings: Secondary | ICD-10-CM

## 2012-10-25 DIAGNOSIS — M25531 Pain in right wrist: Secondary | ICD-10-CM

## 2012-10-25 DIAGNOSIS — J019 Acute sinusitis, unspecified: Secondary | ICD-10-CM

## 2012-10-25 HISTORY — DX: Pain in unspecified joint: M25.50

## 2012-10-25 LAB — CBC WITH DIFFERENTIAL/PLATELET
Basophils Absolute: 0 10*3/uL (ref 0.0–0.1)
Basophils Relative: 0.4 % (ref 0.0–3.0)
Eosinophils Relative: 3.2 % (ref 0.0–5.0)
HCT: 39.9 % (ref 36.0–46.0)
MCHC: 33.3 g/dL (ref 30.0–36.0)
Monocytes Absolute: 0.5 10*3/uL (ref 0.1–1.0)
Neutrophils Relative %: 54.4 % (ref 43.0–77.0)
Platelets: 407 10*3/uL — ABNORMAL HIGH (ref 150.0–400.0)
RDW: 15.6 % — ABNORMAL HIGH (ref 11.5–14.6)
WBC: 5.6 10*3/uL (ref 4.5–10.5)

## 2012-10-25 LAB — BASIC METABOLIC PANEL
Calcium: 9 mg/dL (ref 8.4–10.5)
Chloride: 105 mEq/L (ref 96–112)
Creatinine, Ser: 0.7 mg/dL (ref 0.4–1.2)
GFR: 96.62 mL/min (ref >60.00)

## 2012-10-25 LAB — HEPATIC FUNCTION PANEL
AST: 19 U/L (ref 0–37)
Albumin: 4.2 g/dL (ref 3.5–5.2)
Alkaline Phosphatase: 75 U/L (ref 39–117)
Bilirubin, Direct: 0 mg/dL (ref 0.0–0.3)

## 2012-10-25 LAB — LIPID PANEL
HDL: 44.1 mg/dL (ref >39.00)
LDL Cholesterol: 109 mg/dL — ABNORMAL HIGH (ref 0–99)
Triglycerides: 123 mg/dL (ref 0.0–149.0)

## 2012-10-25 MED ORDER — BUDESONIDE-FORMOTEROL FUMARATE 160-4.5 MCG/ACT IN AERO: 2.0000 | INHALATION_SPRAY | Freq: Two times a day (BID) | RESPIRATORY_TRACT | Status: DC

## 2012-10-25 MED ORDER — FLUTICASONE PROPIONATE 50 MCG/ACT NA SUSP: 2.0000 | Freq: Every day | NASAL | Status: DC

## 2012-10-25 MED ORDER — MELOXICAM 15 MG PO TABS: 15.0000 mg | ORAL_TABLET | Freq: Every day | ORAL | Status: DC

## 2012-10-25 MED ORDER — DOXYCYCLINE HYCLATE 100 MG PO TABS: 100.0000 mg | ORAL_TABLET | Freq: Two times a day (BID) | ORAL | Status: AC

## 2012-10-25 NOTE — Patient Instructions (Addendum)
Follow up in 1 year or as needed We'll notify you of your lab results and make any changes if needed Start the Mobic daily for inflammation Add Tylenol Arthritis as needed Start the Doxy twice daily w/ food for the sinus infection Call with any questions or concerns Happy Spring!

## 2012-10-25 NOTE — Assessment & Plan Note (Signed)
Pt UTD on pap, mammo, colonoscopy.  PE WNL w/ exception of maxillary sinus pain.  Check labs.  Anticipatory guidance provided.

## 2012-10-25 NOTE — Assessment & Plan Note (Signed)
Pt's sxs and PE consistent w/ infxn.  Start abx.  Reviewed supportive care and red flags that should prompt return.  Pt expressed understanding and is in agreement w/ plan. r

## 2012-10-25 NOTE — Progress Notes (Signed)
  Subjective:    Patient ID: Tina Williams, female    DOB: 11/23/1960, 52 y.o.   MRN: [id number]  HPI CPE- UTD on GYN (pap and mammo), UTD on colonoscopy.  Sinus pressure- + maxillary pain.  sxs started when she developed 'bad cold' 2 weeks ago.  Cold sxs improved but sinus pain remains.  No ear pain.  Taking Zyrtec, flonase daily.  No fevers.  Joint pain- broke L wrist in 2012 and has since developed arthritis.  In last few weeks has developed R wrist pain.  L hand dominant but does frequently use R hand.  No swelling.  No particularly motion is more painful than another.  No known injury.  Has tried both tylenol and ibuprofen in the evening w/out relief.  Admits weather has made it more painful.   Review of Systems Patient reports no vision/ hearing changes, adenopathy,fever, weight change,  persistant/recurrent hoarseness , swallowing issues, chest pain, palpitations, edema, persistant/recurrent cough, hemoptysis, dyspnea (rest/exertional/paroxysmal nocturnal), gastrointestinal bleeding (melena, rectal bleeding), abdominal pain, significant heartburn, GU symptoms (dysuria, hematuria, incontinence), Gyn symptoms (abnormal  bleeding, pain), syncope, focal weakness, memory loss, numbness & tingling, skin/hair/nail changes, abnormal bruising or bleeding, anxiety, or depression.   + constipation    Objective:   Physical Exam General Appearance:    Alert, cooperative, no distress, appears stated age  Head:    Normocephalic, without obvious abnormality, atraumatic  Eyes:    PERRL, conjunctiva/corneas clear, EOM's intact, fundi    benign, both eyes  Ears:    Normal TM's and external ear canals, both ears  Nose:   Nares normal, septum midline, mucosa normal, no drainage.  No TTP over frontal sinuses, + TTP over maxillary sinuses  Throat:   Lips, mucosa, and tongue normal; teeth and gums normal  Neck:   Supple, symmetrical, trachea midline, no adenopathy;    Thyroid: no  enlargement/tenderness/nodules  Back:     Symmetric, no curvature, ROM normal, no CVA tenderness  Lungs:     Clear to auscultation bilaterally, respirations unlabored  Chest Wall:    No tenderness or deformity   Heart:    Regular rate and rhythm, S1 and S2 normal, no murmur, rub   or gallop  Breast Exam:    Deferred to GYN  Abdomen:     Soft, non-tender, bowel sounds active all four quadrants,    no masses, no organomegaly  Genitalia:    Deferred to GYN  Rectal:    Extremities:   Extremities normal, atraumatic, no cyanosis or edema  Pulses:   2+ and symmetric all extremities  Skin:   Skin color, texture, turgor normal, no rashes or lesions  Lymph nodes:   Cervical, supraclavicular, and axillary nodes normal  Neurologic:   CNII-XII intact, normal strength, sensation and reflexes    throughout          Assessment & Plan:

## 2012-10-25 NOTE — Assessment & Plan Note (Signed)
New.  Suspect low grade arthritic change since pt's pain worsens w/ activity and weather change.  Start daily NSAID, add tylenol arthritis prn.  Will follow.

## 2012-12-19 ENCOUNTER — Encounter: Payer: Self-pay | Admitting: Internal Medicine

## 2012-12-20 ENCOUNTER — Encounter: Payer: Self-pay | Admitting: Internal Medicine

## 2013-01-29 LAB — HM MAMMOGRAPHY: HM Mammogram: NORMAL

## 2013-03-19 ENCOUNTER — Encounter: Payer: Self-pay | Admitting: *Deleted

## 2013-04-23 ENCOUNTER — Ambulatory Visit (INDEPENDENT_AMBULATORY_CARE_PROVIDER_SITE_OTHER): Payer: BC Managed Care – PPO | Admitting: General Practice

## 2013-04-23 DIAGNOSIS — Z23 Encounter for immunization: Secondary | ICD-10-CM

## 2013-07-24 ENCOUNTER — Encounter: Payer: Self-pay | Admitting: Internal Medicine

## 2013-08-14 ENCOUNTER — Ambulatory Visit (INDEPENDENT_AMBULATORY_CARE_PROVIDER_SITE_OTHER): Payer: BC Managed Care – PPO | Admitting: Nurse Practitioner

## 2013-08-14 ENCOUNTER — Encounter: Payer: Self-pay | Admitting: Nurse Practitioner

## 2013-08-14 ENCOUNTER — Encounter: Payer: Self-pay | Admitting: *Deleted

## 2013-08-14 VITALS — BP 116/76 | HR 107 | Temp 98.4°F | Ht 61.0 in | Wt 173.8 lb

## 2013-08-14 DIAGNOSIS — J45901 Unspecified asthma with (acute) exacerbation: Secondary | ICD-10-CM

## 2013-08-14 DIAGNOSIS — J189 Pneumonia, unspecified organism: Secondary | ICD-10-CM

## 2013-08-14 MED ORDER — BUDESONIDE-FORMOTEROL FUMARATE 160-4.5 MCG/ACT IN AERO: 2.0000 | INHALATION_SPRAY | Freq: Two times a day (BID) | RESPIRATORY_TRACT | Status: DC

## 2013-08-14 MED ORDER — DOXYCYCLINE HYCLATE 100 MG PO TABS: 100.0000 mg | ORAL_TABLET | Freq: Two times a day (BID) | ORAL | Status: DC

## 2013-08-14 MED ORDER — ALBUTEROL SULFATE (2.5 MG/3ML) 0.083% IN NEBU
2.5000 mg | INHALATION_SOLUTION | Freq: Once | RESPIRATORY_TRACT | Status: AC
  Administered 2013-08-14: 2.5 mg via RESPIRATORY_TRACT

## 2013-08-14 MED ORDER — GUAIFENESIN 400 MG PO TABS
ORAL_TABLET | ORAL | Status: DC
Start: 2013-08-14 — End: 2014-07-03

## 2013-08-14 NOTE — Patient Instructions (Addendum)
Without a chest xray, I am not certain you have pneumonia, but given you feel worse while on antibiotics and have developed chest pain, I will treat for pneumonia. Please start antibiotic. Eat yogurt daily, 2 hours after or before you take antibiotic, to help prevent diarrhea. Take humibid daily to help break up congestion. Continue to use inhalers as prescribed. Perform breathing/cough exercises 3-4 times daily-blow up balloon, then cough. Sip fluids every hour. Rest. Let us know if you have not improved within 72h.   Pneumonia, Adult Pneumonia is an infection of the lungs.  CAUSES Pneumonia may be caused by bacteria or a virus. Usually, these infections are caused by breathing infectious particles into the lungs (respiratory tract). SYMPTOMS   Cough.  Fever.  Chest pain.  Increased rate of breathing.  Wheezing.  Mucus production. DIAGNOSIS  If you have the common symptoms of pneumonia, your caregiver will typically confirm the diagnosis with a chest X-ray. The X-ray will show an abnormality in the lung (pulmonary infiltrate) if you have pneumonia. Other tests of your blood, urine, or sputum may be done to find the specific cause of your pneumonia. Your caregiver may also do tests (blood gases or pulse oximetry) to see how well your lungs are working. TREATMENT  Some forms of pneumonia may be spread to other people when you cough or sneeze. You may be asked to wear a mask before and during your exam. Pneumonia that is caused by bacteria is treated with antibiotic medicine. Pneumonia that is caused by the influenza virus may be treated with an antiviral medicine. Most other viral infections must run their course. These infections will not respond to antibiotics.  PREVENTION A pneumococcal shot (vaccine) is available to prevent a common bacterial cause of pneumonia. This is usually suggested for:  People over 79 years old.  Patients on chemotherapy.  People with chronic lung problems,  such as bronchitis or emphysema.  People with immune system problems. If you are over 65 or have a high risk condition, you may receive the pneumococcal vaccine if you have not received it before. In some countries, a routine influenza vaccine is also recommended. This vaccine can help prevent some cases of pneumonia.You may be offered the influenza vaccine as part of your care. If you smoke, it is time to quit. You may receive instructions on how to stop smoking. Your caregiver can provide medicines and counseling to help you quit. HOME CARE INSTRUCTIONS   Cough suppressants may be used if you are losing too much rest. However, coughing protects you by clearing your lungs. You should avoid using cough suppressants if you can.  Your caregiver may have prescribed medicine if he or she thinks your pneumonia is caused by a bacteria or influenza. Finish your medicine even if you start to feel better.  Your caregiver may also prescribe an expectorant. This loosens the mucus to be coughed up.  Only take over-the-counter or prescription medicines for pain, discomfort, or fever as directed by your caregiver.  Do not smoke. Smoking is a common cause of bronchitis and can contribute to pneumonia. If you are a smoker and continue to smoke, your cough may last several weeks after your pneumonia has cleared.  A cold steam vaporizer or humidifier in your room or home may help loosen mucus.  Coughing is often worse at night. Sleeping in a semi-upright position in a recliner or using a couple pillows under your head will help with this.  Get rest as you feel  it is needed. Your body will usually let you know when you need to rest. SEEK IMMEDIATE MEDICAL CARE IF:   Your illness becomes worse. This is especially true if you are elderly or weakened from any other disease.  You cannot control your cough with suppressants and are losing sleep.  You begin coughing up blood.  You develop pain which is getting  worse or is uncontrolled with medicines.  You have a fever.  Any of the symptoms which initially brought you in for treatment are getting worse rather than better.  You develop shortness of breath or chest pain. MAKE SURE YOU:   Understand these instructions.  Will watch your condition.  Will get help right away if you are not doing well or get worse. Document Released: 06/13/2005 Document Revised: 09/05/2011 Document Reviewed: 09/02/2010 [facility] Patient Information 2014 [place], [place].

## 2013-08-14 NOTE — Progress Notes (Signed)
Pre-visit discussion using our clinic review tool. No additional management support is needed unless otherwise documented below in the visit note.  

## 2013-08-14 NOTE — Progress Notes (Signed)
   Subjective:    Patient ID: Tina Williams, female    DOB: 24-May-1961, 53 y.o.   MRN: [id number]  Cough This is a chronic problem. The current episode started 1 to 4 weeks ago (3w). The problem has been gradually worsening. The problem occurs hourly. The cough is productive of sputum. Associated symptoms include chest pain (sternal & posterior pain when coughs), chills, headaches, nasal congestion and wheezing. Pertinent negatives include no ear pain, fever or shortness of breath. She has tried a beta-agonist inhaler (started azithromycin 5da, no improvement) for the symptoms. Her past medical history is significant for asthma.      Review of Systems  Constitutional: Positive for chills. Negative for fever.  HENT: Positive for congestion. Negative for ear pain.   Respiratory: Positive for cough, chest tightness and wheezing. Negative for shortness of breath.   Cardiovascular: Positive for chest pain (sternal & posterior pain when coughs).  Gastrointestinal: Negative for abdominal pain and diarrhea.  Musculoskeletal: Positive for back pain.  Neurological: Positive for headaches.       Objective:   Physical Exam  Vitals reviewed. Constitutional: She is oriented to person, place, and time. She appears well-nourished. No distress (looks tired).  HENT:  Head: Normocephalic and atraumatic.  Right Ear: External ear normal.  Left Ear: External ear normal.  Mouth/Throat: Oropharynx is clear and moist. No oropharyngeal exudate.  Eyes: Conjunctivae are normal. Right eye exhibits no discharge. Left eye exhibits no discharge.  Neck: Normal range of motion. Neck supple. No thyromegaly present.  Cardiovascular: Normal rate and regular rhythm.   No murmur heard. Pulmonary/Chest: Effort normal. No respiratory distress. She has wheezes. She has no rales. She exhibits no tenderness.  Wheezes LUL, coarse breath sounds.  Lymphadenopathy:    She has no cervical adenopathy.  Neurological: She is alert  and oriented to person, place, and time.  Skin: Skin is warm and dry.  Psychiatric: She has a normal mood and affect. Her behavior is normal. Thought content normal.          Assessment & Plan:  1. CAP (community acquired pneumonia) Wheeze LUL, coarse BS bilat. No improvement on azithromycin. - doxycycline (VIBRA-TABS) 100 MG tablet; Take 1 tablet (100 mg total) by mouth 2 (two) times daily.  Dispense: 20 tablet; Refill: 0 - guaifenesin (HUMIBID E) 400 MG TABS tablet; Take 1t po q8h for 5 days then q8h PRN chest congestion  Dispense: 56 tablet; Refill: 0 - albuterol (PROVENTIL) (2.5 MG/3ML) 0.083% nebulizer solution 2.5 mg; Take 3 mLs (2.5 mg total) by nebulization once.  2. Asthma with acute exacerbation  - budesonide-formoterol (SYMBICORT) 160-4.5 MCG/ACT inhaler; Inhale 2 puffs into the lungs 2 (two) times daily.  Dispense: 1 Inhaler; Refill: 11 - albuterol (PROVENTIL) (2.5 MG/3ML) 0.083% nebulizer solution 2.5 mg; Take 3 mLs (2.5 mg total) by nebulization once.

## 2013-10-22 LAB — HM PAP SMEAR: HM PAP: NORMAL

## 2014-01-06 ENCOUNTER — Encounter: Payer: Self-pay | Admitting: Family Medicine

## 2014-01-06 ENCOUNTER — Ambulatory Visit (INDEPENDENT_AMBULATORY_CARE_PROVIDER_SITE_OTHER): Payer: BC Managed Care – PPO | Admitting: Family Medicine

## 2014-01-06 VITALS — BP 128/78 | HR 79 | Temp 98.2°F | Resp 16 | Wt 174.2 lb

## 2014-01-06 DIAGNOSIS — F331 Major depressive disorder, recurrent, moderate: Secondary | ICD-10-CM

## 2014-01-06 DIAGNOSIS — J4521 Mild intermittent asthma with (acute) exacerbation: Secondary | ICD-10-CM

## 2014-01-06 DIAGNOSIS — J45901 Unspecified asthma with (acute) exacerbation: Secondary | ICD-10-CM

## 2014-01-06 HISTORY — DX: Major depressive disorder, recurrent, moderate: F33.1

## 2014-01-06 MED ORDER — BUDESONIDE-FORMOTEROL FUMARATE 160-4.5 MCG/ACT IN AERO
2.0000 | INHALATION_SPRAY | Freq: Two times a day (BID) | RESPIRATORY_TRACT | Status: DC
Start: 2014-01-06 — End: 2015-10-23

## 2014-01-06 MED ORDER — DULOXETINE HCL 30 MG PO CPEP: 30.0000 mg | ORAL_CAPSULE | Freq: Every day | ORAL | Status: DC

## 2014-01-06 NOTE — Patient Instructions (Signed)
Follow up in 4-6 weeks to recheck mood Start the Cymbalta daily for both mood and body pain Call with any questions or concerns I'm proud of you for taking this step!

## 2014-01-06 NOTE — Progress Notes (Signed)
   Subjective:    Patient ID: Tina Williams, female    DOB: Sep 08, 1960, 53 y.o.   MRN: [id number]  HPI Depression- 'i'm depressed.  i just can't get it together.  i've been like this before'.  sxs started 9-10 months ago.  'i just can't snap out of it'.  Now impacting concentration at work.  Increased fatigue.  Eating more than previous.  Pt admits to increased sadness and tearfulness.  Decreased motivation.  Increased aches and pain, physically not feeling well.  No SI/HI.  Has been on meds previously- Dr Matthew Saras had her on Cymbalta for 1 yr, 'it worked'.  Son recently dx'd w/ Bipolar and is getting ready to go to college.   Review of Systems For ROS see HPI     Objective:   Physical Exam  Vitals reviewed. Constitutional: She is oriented to person, place, and time. She appears well-developed and well-nourished. No distress.  Neurological: She is alert and oriented to person, place, and time.  Skin: Skin is warm and dry.  Psychiatric: Her behavior is normal. Judgment and thought content normal.  Flat          Assessment & Plan:

## 2014-01-06 NOTE — Progress Notes (Signed)
Pre visit review using our clinic review tool, if applicable. No additional management support is needed unless otherwise documented below in the visit note. 

## 2014-01-06 NOTE — Assessment & Plan Note (Signed)
New to pt, ongoing issue for pt.  Has been on meds previously but weaned herself off when she felt better.  Has recently had stressful few years w/ son, culminating in him being dx'd as Bipolar.  Due to pt's lack of energy, motivation, and body aches will start Cymbalta to hopefully address all of these issues.  Reviewed supportive care and red flags that should prompt return.  Pt expressed understanding and is in agreement w/ plan.

## 2014-02-13 ENCOUNTER — Ambulatory Visit (INDEPENDENT_AMBULATORY_CARE_PROVIDER_SITE_OTHER): Payer: BC Managed Care – PPO | Admitting: Family Medicine

## 2014-02-13 ENCOUNTER — Encounter: Payer: Self-pay | Admitting: Family Medicine

## 2014-02-13 VITALS — BP 124/84 | HR 76 | Temp 98.5°F | Resp 16 | Wt 170.4 lb

## 2014-02-13 DIAGNOSIS — F331 Major depressive disorder, recurrent, moderate: Secondary | ICD-10-CM

## 2014-02-13 MED ORDER — DULOXETINE HCL 60 MG PO CPEP: 60.0000 mg | ORAL_CAPSULE | Freq: Every day | ORAL | Status: DC

## 2014-02-13 NOTE — Assessment & Plan Note (Signed)
Improved since starting Cymbalta.  Will increase dose to 60mg  and monitor for improvement.  Pt expressed understanding and is in agreement w/ plan.

## 2014-02-13 NOTE — Progress Notes (Signed)
Pre visit review using our clinic review tool, if applicable. No additional management support is needed unless otherwise documented below in the visit note. 

## 2014-02-13 NOTE — Patient Instructions (Signed)
Schedule your complete physical in 6 months Increase the Cymbalta to 60 mg daily- 2 of what you have at home and 1 of the new prescription Call with any questions or concerns I'm so glad we're on the right track! You can do this!!!

## 2014-02-13 NOTE — Progress Notes (Signed)
   Subjective:    Patient ID: Tina Williams, female    DOB: 1961-03-31, 53 y.o.   MRN: [id number]  HPI Depression- chronic problem, was started on Cymbalta at last visit.  Concentration has improved, body aches have improved but still occur on some days.  Motivation has improved.  Pt is interested in increasing dose for added benefit.   Review of Systems For ROS see HPI     Objective:   Physical Exam  Vitals reviewed. Constitutional: She is oriented to person, place, and time. She appears well-developed and well-nourished. No distress.  Neurological: She is alert and oriented to person, place, and time. No cranial nerve deficit.  Psychiatric: She has a normal mood and affect. Her behavior is normal. Thought content normal.          Assessment & Plan:

## 2014-03-14 ENCOUNTER — Encounter: Payer: Self-pay | Admitting: Internal Medicine

## 2014-05-13 ENCOUNTER — Ambulatory Visit (INDEPENDENT_AMBULATORY_CARE_PROVIDER_SITE_OTHER): Payer: BC Managed Care – PPO | Admitting: Family Medicine

## 2014-05-13 ENCOUNTER — Encounter: Payer: Self-pay | Admitting: Family Medicine

## 2014-05-13 VITALS — BP 130/80 | HR 75 | Temp 98.4°F | Resp 16 | Wt 171.2 lb

## 2014-05-13 DIAGNOSIS — R1013 Epigastric pain: Secondary | ICD-10-CM | POA: Insufficient documentation

## 2014-05-13 DIAGNOSIS — J0100 Acute maxillary sinusitis, unspecified: Secondary | ICD-10-CM

## 2014-05-13 HISTORY — DX: Epigastric pain: R10.13

## 2014-05-13 LAB — CBC WITH DIFFERENTIAL/PLATELET
Basophils Absolute: 0 10*3/uL (ref 0.0–0.1)
Basophils Relative: 0.4 % (ref 0.0–3.0)
Eosinophils Absolute: 0.1 10*3/uL (ref 0.0–0.7)
Eosinophils Relative: 1.5 % (ref 0.0–5.0)
HCT: 40.2 % (ref 36.0–46.0)
HEMOGLOBIN: 13 g/dL (ref 12.0–15.0)
Lymphocytes Relative: 25.5 % (ref 12.0–46.0)
Lymphs Abs: 2.1 10*3/uL (ref 0.7–4.0)
MCHC: 32.3 g/dL (ref 30.0–36.0)
MCV: 82.3 fl (ref 78.0–100.0)
MONO ABS: 0.6 10*3/uL (ref 0.1–1.0)
Monocytes Relative: 7 % (ref 3.0–12.0)
NEUTROS ABS: 5.4 10*3/uL (ref 1.4–7.7)
Neutrophils Relative %: 65.6 % (ref 43.0–77.0)
Platelets: 431 10*3/uL — ABNORMAL HIGH (ref 150.0–400.0)
RBC: 4.89 Mil/uL (ref 3.87–5.11)
RDW: 14.9 % (ref 11.5–15.5)
WBC: 8.2 10*3/uL (ref 4.0–10.5)

## 2014-05-13 LAB — BASIC METABOLIC PANEL
BUN: 16 mg/dL (ref 6–23)
CO2: 29 mEq/L (ref 19–32)
CREATININE: 0.7 mg/dL (ref 0.4–1.2)
Calcium: 9.1 mg/dL (ref 8.4–10.5)
Chloride: 104 mEq/L (ref 96–112)
GFR: 94.44 mL/min (ref >60.00)
Glucose, Bld: 87 mg/dL (ref 70–99)
Potassium: 3.8 mEq/L (ref 3.5–5.1)
Sodium: 137 mEq/L (ref 135–145)

## 2014-05-13 LAB — HEPATIC FUNCTION PANEL
ALT: 12 U/L (ref 0–35)
AST: 17 U/L (ref 0–37)
Albumin: 4.4 g/dL (ref 3.5–5.2)
Alkaline Phosphatase: 78 U/L (ref 39–117)
BILIRUBIN TOTAL: 0.8 mg/dL (ref 0.2–1.2)
Bilirubin, Direct: 0 mg/dL (ref 0.0–0.3)
Total Protein: 7.3 g/dL (ref 6.0–8.3)

## 2014-05-13 LAB — LIPASE: LIPASE: 33 U/L (ref 11.0–59.0)

## 2014-05-13 LAB — H. PYLORI ANTIBODY, IGG: H PYLORI IGG: NEGATIVE

## 2014-05-13 MED ORDER — OMEPRAZOLE 40 MG PO CPDR: 40.0000 mg | DELAYED_RELEASE_CAPSULE | Freq: Every day | ORAL | Status: DC

## 2014-05-13 MED ORDER — DOXYCYCLINE HYCLATE 100 MG PO TABS: 100.0000 mg | ORAL_TABLET | Freq: Two times a day (BID) | ORAL | Status: DC

## 2014-05-13 NOTE — Assessment & Plan Note (Signed)
Pt's sxs and PE consistent w/ infxn.  Start abx.  Reviewed supportive care and red flags that should prompt return.  Pt expressed understanding and is in agreement w/ plan.  

## 2014-05-13 NOTE — Assessment & Plan Note (Signed)
New.  Suspect this is GERD induced gastritis.  Start PPI.  Get labs to r/o underlying biliary issue, H pylori, infxn.  Treat abnormalities if they arise.  Reviewed supportive care and red flags that should prompt return.  Pt expressed understanding and is in agreement w/ plan.

## 2014-05-13 NOTE — Progress Notes (Signed)
   Subjective:    Patient ID: Tina Williams, female    DOB: 1960/12/14, 53 y.o.   MRN: [id number]  HPI URI- 'i believe i got a sinus infxn'.  sxs started 5-6 weeks ago w/ chest cold.  Went to UC and was treated w/ Zpack, nasal spray w/ improvement in sxs.  2-3 weeks ago, sxs returned.  + maxillary pressure, L>R.  + tooth pain.  Bilateral ear fullness.  Intermittent cough.  + PND- 'it's real thick'.  + sick contacts.  'my stomach is burning'- intermittent diaphragm pain.  + GERD, increased belching.  Mild nausea.  No vomiting or diarrhea.  + constipation.  No fevers.     Review of Systems For ROS see HPI     Objective:   Physical Exam  Constitutional: She appears well-developed and well-nourished. No distress.  HENT:  Head: Normocephalic and atraumatic.  Right Ear: Tympanic membrane normal.  Left Ear: Tympanic membrane normal.  Nose: Mucosal edema and rhinorrhea present. Right sinus exhibits maxillary sinus tenderness and frontal sinus tenderness. Left sinus exhibits maxillary sinus tenderness and frontal sinus tenderness.  Mouth/Throat: Uvula is midline and mucous membranes are normal. Posterior oropharyngeal erythema present. No oropharyngeal exudate.  Eyes: Conjunctivae and EOM are normal. Pupils are equal, round, and reactive to light.  Neck: Normal range of motion. Neck supple.  Cardiovascular: Normal rate, regular rhythm and normal heart sounds.   Pulmonary/Chest: Effort normal and breath sounds normal. No respiratory distress. She has no wheezes.  Abdominal: Soft. Bowel sounds are normal. She exhibits no distension. There is no tenderness. There is no rebound and no guarding.  Lymphadenopathy:    She has no cervical adenopathy.  Vitals reviewed.         Assessment & Plan:

## 2014-05-13 NOTE — Patient Instructions (Addendum)
Follow up as needed Start the Doxycycline twice daily (w/ food) for the sinus infection Start the Omeprazole daily to decrease acid production We'll notify you of your lab results and make any changes if needed Call with any questions or concerns Hang in there!!!

## 2014-05-13 NOTE — Progress Notes (Signed)
Pre visit review using our clinic review tool, if applicable. No additional management support is needed unless otherwise documented below in the visit note. 

## 2014-06-06 ENCOUNTER — Other Ambulatory Visit: Payer: Self-pay | Admitting: General Practice

## 2014-06-06 MED ORDER — DULOXETINE HCL 60 MG PO CPEP: 60.0000 mg | ORAL_CAPSULE | Freq: Every day | ORAL | Status: DC

## 2014-07-03 ENCOUNTER — Ambulatory Visit (INDEPENDENT_AMBULATORY_CARE_PROVIDER_SITE_OTHER): Payer: BLUE CROSS/BLUE SHIELD | Admitting: Family Medicine

## 2014-07-03 ENCOUNTER — Encounter: Payer: Self-pay | Admitting: Family Medicine

## 2014-07-03 VITALS — BP 110/78 | HR 79 | Temp 98.1°F | Resp 16 | Wt 174.5 lb

## 2014-07-03 DIAGNOSIS — J0101 Acute recurrent maxillary sinusitis: Secondary | ICD-10-CM

## 2014-07-03 MED ORDER — DOXYCYCLINE HYCLATE 100 MG PO TABS: 100.0000 mg | ORAL_TABLET | Freq: Two times a day (BID) | ORAL | Status: DC

## 2014-07-03 NOTE — Progress Notes (Signed)
Pre visit review using our clinic review tool, if applicable. No additional management support is needed unless otherwise documented below in the visit note. 

## 2014-07-03 NOTE — Progress Notes (Signed)
   Subjective:    Patient ID: Tina Williams, female    DOB: [birth year], 54 y.o.   MRN: [id number]  HPI URI- pt reports sxs returned 'a few weeks' ago.  Hx of similar.  Last tx'd 11/17 w/ Doxy.  + facial pain/pressure, 'lungs aching'.  Minimal cough.  Bilateral ear fullness.  + tooth pain.  No fevers.  + sick contacts.  Some SOB.  + PND.   Review of Systems For ROS see HPI     Objective:   Physical Exam  Constitutional: She appears well-developed and well-nourished. No distress.  HENT:  Head: Normocephalic and atraumatic.  Right Ear: Tympanic membrane normal.  Left Ear: Tympanic membrane normal.  Nose: Mucosal edema and rhinorrhea present. Right sinus exhibits maxillary sinus tenderness. Right sinus exhibits no frontal sinus tenderness. Left sinus exhibits maxillary sinus tenderness. Left sinus exhibits no frontal sinus tenderness.  Mouth/Throat: Uvula is midline and mucous membranes are normal. Posterior oropharyngeal erythema present. No oropharyngeal exudate.  Eyes: Conjunctivae and EOM are normal. Pupils are equal, round, and reactive to light.  Neck: Normal range of motion. Neck supple.  Cardiovascular: Normal rate, regular rhythm and normal heart sounds.   Pulmonary/Chest: Effort normal and breath sounds normal. No respiratory distress. She has no wheezes.  Lymphadenopathy:    She has no cervical adenopathy.  Vitals reviewed.         Assessment & Plan:

## 2014-07-03 NOTE — Assessment & Plan Note (Signed)
Pt's sxs and PE again consistent w/ infxn.  This is recurrent problem for pt.  Will start Doxy due to multiple medication allergies.  Reviewed supportive care and red flags that should prompt return.  Pt expressed understanding and is in agreement w/ plan.

## 2014-07-03 NOTE — Patient Instructions (Signed)
Follow up as needed Start the Doxycycline twice daily- take w/ food Drink plenty of fluids Mucinex to thin your congestion/drainage REST! Call with any questions or concerns Hang in there! Happy New Year!

## 2014-08-18 ENCOUNTER — Telehealth: Payer: Self-pay

## 2014-08-18 NOTE — Telephone Encounter (Signed)
Unable to reach/LVM   90 day supply/mail order:  Local pharmacy:    Immunizations due:  Tdap  A/P:   No changes to FH, PSH or Personal Hx Flu vaccine--due Tdap--due PNA--n/a Shingles--n/a Pap--?? [facility]--03/2013--neg bi rads Bone Density--none on file [facility]--01/29/2013--[facility]--[facility]--q 3 years--polyps  To Discuss with Provider:

## 2014-08-19 ENCOUNTER — Ambulatory Visit (INDEPENDENT_AMBULATORY_CARE_PROVIDER_SITE_OTHER): Payer: BLUE CROSS/BLUE SHIELD | Admitting: Family Medicine

## 2014-08-19 ENCOUNTER — Encounter: Payer: Self-pay | Admitting: Family Medicine

## 2014-08-19 VITALS — BP 112/80 | HR 65 | Temp 97.9°F | Resp 16 | Ht 61.75 in | Wt 167.4 lb

## 2014-08-19 DIAGNOSIS — Z0000 Encounter for general adult medical examination without abnormal findings: Secondary | ICD-10-CM

## 2014-08-19 DIAGNOSIS — F331 Major depressive disorder, recurrent, moderate: Secondary | ICD-10-CM

## 2014-08-19 LAB — CBC WITH DIFFERENTIAL/PLATELET
BASOS ABS: 0 10*3/uL (ref 0.0–0.1)
BASOS PCT: 0.7 % (ref 0.0–3.0)
Eosinophils Absolute: 0.2 10*3/uL (ref 0.0–0.7)
Eosinophils Relative: 3.6 % (ref 0.0–5.0)
HEMATOCRIT: 40.2 % (ref 36.0–46.0)
Hemoglobin: 13.4 g/dL (ref 12.0–15.0)
LYMPHS ABS: 1.4 10*3/uL (ref 0.7–4.0)
Lymphocytes Relative: 29 % (ref 12.0–46.0)
MCHC: 33.2 g/dL (ref 30.0–36.0)
MCV: 81.1 fl (ref 78.0–100.0)
Monocytes Absolute: 0.4 10*3/uL (ref 0.1–1.0)
Monocytes Relative: 8.1 % (ref 3.0–12.0)
NEUTROS ABS: 2.8 10*3/uL (ref 1.4–7.7)
Neutrophils Relative %: 58.6 % (ref 43.0–77.0)
Platelets: 393 10*3/uL (ref 150.0–400.0)
RBC: 4.96 Mil/uL (ref 3.87–5.11)
RDW: 14.7 % (ref 11.5–15.5)
WBC: 4.9 10*3/uL (ref 4.0–10.5)

## 2014-08-19 LAB — BASIC METABOLIC PANEL
BUN: 12 mg/dL (ref 6–23)
CHLORIDE: 106 meq/L (ref 96–112)
CO2: 31 mEq/L (ref 19–32)
CREATININE: 0.72 mg/dL (ref 0.40–1.20)
Calcium: 9.4 mg/dL (ref 8.4–10.5)
GFR: 89.82 mL/min (ref >60.00)
Glucose, Bld: 100 mg/dL — ABNORMAL HIGH (ref 70–99)
POTASSIUM: 4.2 meq/L (ref 3.5–5.1)
SODIUM: 140 meq/L (ref 135–145)

## 2014-08-19 LAB — VITAMIN D 25 HYDROXY (VIT D DEFICIENCY, FRACTURES): VITD: 33.53 ng/mL (ref 30.00–100.00)

## 2014-08-19 LAB — LIPID PANEL
Cholesterol: 204 mg/dL — ABNORMAL HIGH (ref 0–200)
HDL: 51.1 mg/dL (ref >39.00)
LDL Cholesterol: 136 mg/dL — ABNORMAL HIGH (ref 0–99)
NONHDL: 152.9
Total CHOL/HDL Ratio: 4
Triglycerides: 87 mg/dL (ref 0.0–149.0)
VLDL: 17.4 mg/dL (ref 0.0–40.0)

## 2014-08-19 LAB — TSH: TSH: 0.95 u[IU]/mL (ref 0.35–4.50)

## 2014-08-19 LAB — HEPATIC FUNCTION PANEL
ALT: 12 U/L (ref 0–35)
AST: 15 U/L (ref 0–37)
Albumin: 4.2 g/dL (ref 3.5–5.2)
Alkaline Phosphatase: 86 U/L (ref 39–117)
Bilirubin, Direct: 0.1 mg/dL (ref 0.0–0.3)
TOTAL PROTEIN: 6.8 g/dL (ref 6.0–8.3)
Total Bilirubin: 0.7 mg/dL (ref 0.2–1.2)

## 2014-08-19 MED ORDER — DULOXETINE HCL 30 MG PO CPEP
30.0000 mg | ORAL_CAPSULE | Freq: Every day | ORAL | Status: DC
Start: 2014-08-19 — End: 2014-09-29

## 2014-08-19 NOTE — Assessment & Plan Note (Signed)
Pt's PE WNL.  UTD on colonoscopy but due for GYN.  Pt to schedule w/ Dr Matthew Saras for pap and mammo.  Check labs.  Anticipatory guidance provided.

## 2014-08-19 NOTE — Patient Instructions (Signed)
Follow up in 1 year or as needed. We'll notify you of your lab results and make any changes if needed Keep up the good work on health diet and regular exercise Increase the Cymbalta to 90mg - 60mg  + 30mg  daily Call and schedule your GYN exam w/ Dr Matthew Saras Call with any questions or concerns Happy Spring!!!

## 2014-08-19 NOTE — Assessment & Plan Note (Signed)
Improved since starting Cymbalta but pt feels there is room for improvement.  Will increase to 90mg  daily (30+60mg ) and monitor for improvement.  Pt expressed understanding and is in agreement w/ plan.

## 2014-08-19 NOTE — Progress Notes (Signed)
   Subjective:    Patient ID: Tina Williams, female    DOB: [birth year], 54 y.o.   MRN: [id number]  HPI CPE- Pt has GYN Matthew Saras), overdue on mammo.  UTD on colonoscopy.   Review of Systems Patient reports no vision/ hearing changes, adenopathy,fever, weight change,  persistant/recurrent hoarseness , swallowing issues, chest pain, palpitations, edema, persistant/recurrent cough, hemoptysis, dyspnea (rest/exertional/paroxysmal nocturnal), gastrointestinal bleeding (melena, rectal bleeding), abdominal pain, significant heartburn, bowel changes, GU symptoms (dysuria, hematuria, incontinence), Gyn symptoms (abnormal  bleeding, pain),  syncope, focal weakness, memory loss, numbness & tingling, skin/hair/nail changes, abnormal bruising or bleeding.  + anxiety/depression- chronic problem, doing better on Cymbalta but would like to go up on dose   Reviewed meds, allergies, problem list, and PMH in chart     Objective:   Physical Exam General Appearance:    Alert, cooperative, no distress, appears stated age  Head:    Normocephalic, without obvious abnormality, atraumatic  Eyes:    PERRL, conjunctiva/corneas clear, EOM's intact, fundi    benign, both eyes  Ears:    Normal TM's and external ear canals, both ears  Nose:   Nares normal, septum midline, mucosa normal, no drainage    or sinus tenderness  Throat:   Lips, mucosa, and tongue normal; teeth and gums normal  Neck:   Supple, symmetrical, trachea midline, no adenopathy;    Thyroid: no enlargement/tenderness/nodules  Back:     Symmetric, no curvature, ROM normal, no CVA tenderness  Lungs:     Clear to auscultation bilaterally, respirations unlabored  Chest Wall:    No tenderness or deformity   Heart:    Regular rate and rhythm, S1 and S2 normal, no murmur, rub   or gallop  Breast Exam:    Deferred to GYN  Abdomen:     Soft, non-tender, bowel sounds active all four quadrants,    no masses, no organomegaly  Genitalia:    Deferred to GYN    Rectal:    Extremities:   Extremities normal, atraumatic, no cyanosis or edema  Pulses:   2+ and symmetric all extremities  Skin:   Skin color, texture, turgor normal, no rashes or lesions  Lymph nodes:   Cervical, supraclavicular, and axillary nodes normal  Neurologic:   CNII-XII intact, normal strength, sensation and reflexes    throughout          Assessment & Plan:

## 2014-08-19 NOTE — Progress Notes (Signed)
Pre visit review using our clinic review tool, if applicable. No additional management support is needed unless otherwise documented below in the visit note. 

## 2014-09-10 ENCOUNTER — Other Ambulatory Visit: Payer: Self-pay | Admitting: Family Medicine

## 2014-09-27 ENCOUNTER — Other Ambulatory Visit: Payer: Self-pay | Admitting: Family Medicine

## 2014-09-29 NOTE — Telephone Encounter (Signed)
Med filled.  

## 2014-12-12 ENCOUNTER — Other Ambulatory Visit: Payer: Self-pay | Admitting: Family Medicine

## 2014-12-12 NOTE — Telephone Encounter (Signed)
Med filled.  

## 2015-01-30 ENCOUNTER — Telehealth: Payer: Self-pay | Admitting: Family Medicine

## 2015-02-06 MED ORDER — DULOXETINE HCL 60 MG PO CPEP: 60.0000 mg | ORAL_CAPSULE | Freq: Every day | ORAL | Status: DC

## 2015-02-06 NOTE — Addendum Note (Signed)
Addended by: Kris Hartmann on: 02/06/2015 02:44 PM   Modules accepted: Orders

## 2015-02-06 NOTE — Telephone Encounter (Signed)
Pt states that pharmacy told her we declined refill on 60mg  Cymbalta telling her last week that she had plenty of refills. Pt is only 60mg  Cymbalta & 30mg  Cymbalta. There are refills left on the 30mg  but she is out of refills on the 60mg  (she is to take a total of 90mg ). Please send in the RX for 60mg  Cymbalta to [facility] in [place].

## 2015-02-06 NOTE — Telephone Encounter (Signed)
Not sure who told pt that? I have not received a refill request on this medication since April. Medication filled to pharmacy as requested.

## 2015-03-04 DIAGNOSIS — J988 Other specified respiratory disorders: Secondary | ICD-10-CM | POA: Insufficient documentation

## 2015-03-04 DIAGNOSIS — T781XXD Other adverse food reactions, not elsewhere classified, subsequent encounter: Secondary | ICD-10-CM

## 2015-03-04 DIAGNOSIS — J301 Allergic rhinitis due to pollen: Secondary | ICD-10-CM | POA: Insufficient documentation

## 2015-03-04 DIAGNOSIS — Z91018 Allergy to other foods: Secondary | ICD-10-CM

## 2015-03-04 HISTORY — DX: Allergic rhinitis due to pollen: J30.1

## 2015-03-04 HISTORY — DX: Other adverse food reactions, not elsewhere classified, subsequent encounter: T78.1XXD

## 2015-03-04 HISTORY — DX: Other specified respiratory disorders: J98.8

## 2015-03-27 ENCOUNTER — Ambulatory Visit (INDEPENDENT_AMBULATORY_CARE_PROVIDER_SITE_OTHER): Payer: BLUE CROSS/BLUE SHIELD

## 2015-03-27 DIAGNOSIS — J3089 Other allergic rhinitis: Secondary | ICD-10-CM | POA: Diagnosis not present

## 2015-03-30 ENCOUNTER — Ambulatory Visit (INDEPENDENT_AMBULATORY_CARE_PROVIDER_SITE_OTHER): Payer: BLUE CROSS/BLUE SHIELD | Admitting: *Deleted

## 2015-03-30 DIAGNOSIS — J309 Allergic rhinitis, unspecified: Secondary | ICD-10-CM

## 2015-04-02 ENCOUNTER — Ambulatory Visit (INDEPENDENT_AMBULATORY_CARE_PROVIDER_SITE_OTHER): Payer: BLUE CROSS/BLUE SHIELD | Admitting: *Deleted

## 2015-04-02 DIAGNOSIS — J309 Allergic rhinitis, unspecified: Secondary | ICD-10-CM

## 2015-04-06 ENCOUNTER — Ambulatory Visit (INDEPENDENT_AMBULATORY_CARE_PROVIDER_SITE_OTHER): Payer: BLUE CROSS/BLUE SHIELD | Admitting: *Deleted

## 2015-04-06 DIAGNOSIS — J309 Allergic rhinitis, unspecified: Secondary | ICD-10-CM

## 2015-04-09 ENCOUNTER — Ambulatory Visit (INDEPENDENT_AMBULATORY_CARE_PROVIDER_SITE_OTHER): Payer: BLUE CROSS/BLUE SHIELD | Admitting: *Deleted

## 2015-04-09 DIAGNOSIS — J309 Allergic rhinitis, unspecified: Secondary | ICD-10-CM | POA: Diagnosis not present

## 2015-04-13 ENCOUNTER — Other Ambulatory Visit: Payer: Self-pay | Admitting: Family Medicine

## 2015-04-13 NOTE — Telephone Encounter (Signed)
Medication filled to pharmacy as requested.   

## 2015-04-17 ENCOUNTER — Ambulatory Visit (INDEPENDENT_AMBULATORY_CARE_PROVIDER_SITE_OTHER): Payer: BLUE CROSS/BLUE SHIELD

## 2015-04-17 DIAGNOSIS — J309 Allergic rhinitis, unspecified: Secondary | ICD-10-CM | POA: Diagnosis not present

## 2015-04-20 ENCOUNTER — Ambulatory Visit (INDEPENDENT_AMBULATORY_CARE_PROVIDER_SITE_OTHER): Payer: BLUE CROSS/BLUE SHIELD | Admitting: *Deleted

## 2015-04-20 DIAGNOSIS — J309 Allergic rhinitis, unspecified: Secondary | ICD-10-CM | POA: Diagnosis not present

## 2015-04-22 ENCOUNTER — Ambulatory Visit (INDEPENDENT_AMBULATORY_CARE_PROVIDER_SITE_OTHER): Payer: BLUE CROSS/BLUE SHIELD | Admitting: Allergy and Immunology

## 2015-04-22 ENCOUNTER — Encounter: Payer: Self-pay | Admitting: Allergy and Immunology

## 2015-04-22 VITALS — BP 132/84 | HR 80 | Resp 16 | Ht 59.45 in | Wt 167.5 lb

## 2015-04-22 DIAGNOSIS — J4541 Moderate persistent asthma with (acute) exacerbation: Secondary | ICD-10-CM

## 2015-04-22 MED ORDER — ALBUTEROL SULFATE (2.5 MG/3ML) 0.083% IN NEBU
2.5000 mg | INHALATION_SOLUTION | Freq: Once | RESPIRATORY_TRACT | Status: AC
  Administered 2015-04-22: 2.5 mg via RESPIRATORY_TRACT

## 2015-04-22 MED ORDER — METHYLPREDNISOLONE ACETATE 80 MG/ML IJ SUSP
80.0000 mg | Freq: Once | INTRAMUSCULAR | Status: AC
Start: 2015-04-22 — End: 2015-04-22
  Administered 2015-04-22: 80 mg via INTRAMUSCULAR

## 2015-04-22 MED ORDER — IPRATROPIUM BROMIDE 0.02 % IN SOLN
0.5000 mg | Freq: Once | RESPIRATORY_TRACT | Status: AC
  Administered 2015-04-22: 0.5 mg via RESPIRATORY_TRACT

## 2015-04-22 NOTE — Patient Instructions (Signed)
  1. Nebulizer with Duoneb now  2. Depomedrol 80 IM now  3. Continue Asmanex 200 HFA two inhalations two times per day.   4. Continue flonase, claritin, ProairHFA  5. When better, get a flu vaccine  6. Return in 4 months or earlier if problem

## 2015-04-22 NOTE — Progress Notes (Signed)
[facility]  Follow-up Note  Refering Provider: Midge Minium, MD Primary Provider: Annye Asa, MD  Subjective:   Tina Williams is a 54 y.o. female who returns to the [facility] in re-evaluation of the following:  HPI Comments:  Tina Williams develop problems with wheezing and some coughing over the course of the past 3 days. She had to use her bronchodilator multiple times per day. This may been triggered off by fumes from hand lotion although there is really no obvious trigger. She's not had any new environmental exposures. She does not have any new nasal symptoms or reflux issues. When she uses her bronchodilator and last for about 5 hours.   Outpatient Prescriptions Prior to Visit  Medication Sig  . albuterol (VENTOLIN HFA) 108 (90 BASE) MCG/ACT inhaler Inhale 2 puffs into the lungs every 6 (six) hours as needed.    . DULoxetine (CYMBALTA) 30 MG capsule TAKE 1 CAPSULE (30 MG TOTAL) BY MOUTH DAILY IN ADDITION TO 60MG  FOR TOTAL OF 90MG   . Ibuprofen (ADVIL PO) Take by mouth.  . loratadine (CLARITIN) 10 MG tablet Take 10 mg by mouth daily as needed for allergies.  . Mometasone Furoate (ASMANEX HFA) 200 MCG/ACT AERO Inhale 2 puffs into the lungs 2 (two) times daily. WITH SPACER  . omeprazole (PRILOSEC) 40 MG capsule TAKE 1 CAPSULE (40 MG TOTAL) BY MOUTH DAILY.  Marland Kitchen azithromycin (ZITHROMAX) 500 MG tablet Take 500 mg by mouth daily. FOR 3 DAYS  . budesonide-formoterol (SYMBICORT) 160-4.5 MCG/ACT inhaler Inhale 2 puffs into the lungs 2 (two) times daily. (Patient not taking: Reported on 04/22/2015)  . dextromethorphan-guaiFENesin (MUCINEX DM) 30-600 MG per 12 hr tablet Take 1 tablet by mouth 2 (two) times daily as needed for cough.  . Multiple Vitamins-Minerals (MULTIVITAMIN PO) Take 1 tablet by mouth daily.  . predniSONE (DELTASONE) 10 MG tablet Take 20 mg by mouth daily with breakfast. FOR 3 DAYS  . Triamcinolone  Acetonide (NASACORT ALLERGY 24HR NA) Place into the nose.   No facility-administered medications prior to visit.    Meds ordered this encounter  Medications  . albuterol (PROVENTIL) (2.5 MG/3ML) 0.083% nebulizer solution 2.5 mg    Sig:   . ipratropium (ATROVENT) nebulizer solution 0.5 mg    Sig:   . methylPREDNISolone acetate (DEPO-MEDROL) injection 80 mg    Sig:     Past Medical History  Diagnosis Date  . Non-allergic rhinitis   . Sinusitis acute   . Asthma   . URI (upper respiratory infection)   . Obesity   . Early menopause   . Hyperlipidemia   . Personal history of colonic adenoma  11/26/2009    Past Surgical History  Procedure Laterality Date  . Cesarean section    . Knee surgery      right    Allergies  Allergen Reactions  . Amoxicillin Hives  . Clarithromycin     REACTION: hives  . Naproxen Sodium     REACTION: HIVES  . Penicillins     REACTION: hives  . Sulfa Antibiotics     Review of Systems  Constitutional: Negative for fever, chills and malaise/fatigue.  HENT: Negative.   Eyes: Negative.   Respiratory: Positive for cough and wheezing.   Cardiovascular: Negative.   Gastrointestinal: Negative.   Musculoskeletal: Negative for myalgias and joint pain.  Skin: Negative.      Objective:   Filed Vitals:   04/22/15 1628  BP: 132/84  Pulse: 80  Resp: 16   Height: 4' 11.45" (151 cm)  Weight: 167 lb 8.8 oz (76 kg)   Physical Exam  Constitutional: She is well-developed, well-nourished, and in no distress. No distress.  HENT:  Head: Normocephalic and atraumatic. Head is without right periorbital erythema and without left periorbital erythema.  Right Ear: Tympanic membrane, external ear and ear canal normal. No drainage. No foreign bodies. Tympanic membrane is not injected, not scarred, not perforated, not erythematous, not retracted and not bulging. No middle ear effusion.  Left Ear: Tympanic membrane, external ear and ear canal normal. No  drainage. No foreign bodies. Tympanic membrane is not injected, not scarred, not perforated, not erythematous, not retracted and not bulging.  No middle ear effusion.  Nose: Nose normal. No mucosal edema, rhinorrhea, nose lacerations, sinus tenderness, nasal deformity, septal deviation or nasal septal hematoma. No epistaxis.  Mouth/Throat: Oropharynx is clear and moist and mucous membranes are normal. No oropharyngeal exudate, posterior oropharyngeal edema, posterior oropharyngeal erythema or tonsillar abscesses.  Eyes: Conjunctivae and lids are normal. Pupils are equal, round, and reactive to light. Right eye exhibits no discharge and no exudate. No foreign body present in the right eye. Left eye exhibits no discharge and no exudate. No foreign body present in the left eye. Right conjunctiva is not injected. Right conjunctiva has no hemorrhage. Left conjunctiva is not injected. Left conjunctiva has no hemorrhage. No scleral icterus.  Neck: No tracheal tenderness present. No tracheal deviation present. No thyromegaly present.  Cardiovascular: Normal rate, regular rhythm, S1 normal, S2 normal and normal heart sounds.  Exam reveals no gallop and no friction rub.   No murmur heard. Pulmonary/Chest: Effort normal. No stridor. No respiratory distress. She has wheezes. She has no rhonchi. She has no rales. She exhibits no tenderness.  And expiratory wheezes heard bilaterally in all lung fields on forced expiration  Musculoskeletal: She exhibits no edema or tenderness.  Lymphadenopathy:    She has no cervical adenopathy.  Skin: No purpura and no rash noted. Rash is not macular, not maculopapular, not nodular, not pustular, not vesicular and not urticarial. She is not diaphoretic. No cyanosis or erythema. No pallor. Nails show no clubbing.  Psychiatric: Mood, affect and judgment normal.    Diagnostics:    Spirometry was performed and demonstrated an FEV1 of 2.04 at 89 % of predicted.  The patient had an  Asthma Control Test with the following results: ACT Total Score: 13.    Assessment and Plan:   1. Moderate persistent asthma, with acute exacerbation      1. Nebulizer with Duoneb now  2. Depomedrol 80 IM now  3. Continue Asmanex 200 HFA two inhalations two times per day.   4. Continue flonase, claritin, ProairHFA  5. When better, get a flu vaccine  6. Return in 4 months or earlier if problem  Hopefully Tina Williams will do well with a forementioned therapy and she will contact me should she not resolve her most recent flareup.      Allena Katz, MD [place]

## 2015-04-24 ENCOUNTER — Ambulatory Visit (INDEPENDENT_AMBULATORY_CARE_PROVIDER_SITE_OTHER): Payer: BLUE CROSS/BLUE SHIELD | Admitting: *Deleted

## 2015-04-24 DIAGNOSIS — J309 Allergic rhinitis, unspecified: Secondary | ICD-10-CM | POA: Diagnosis not present

## 2015-04-27 ENCOUNTER — Ambulatory Visit (INDEPENDENT_AMBULATORY_CARE_PROVIDER_SITE_OTHER): Payer: BLUE CROSS/BLUE SHIELD

## 2015-04-27 DIAGNOSIS — J309 Allergic rhinitis, unspecified: Secondary | ICD-10-CM

## 2015-05-01 ENCOUNTER — Ambulatory Visit (INDEPENDENT_AMBULATORY_CARE_PROVIDER_SITE_OTHER): Payer: BLUE CROSS/BLUE SHIELD | Admitting: *Deleted

## 2015-05-01 DIAGNOSIS — J309 Allergic rhinitis, unspecified: Secondary | ICD-10-CM

## 2015-05-04 ENCOUNTER — Ambulatory Visit (INDEPENDENT_AMBULATORY_CARE_PROVIDER_SITE_OTHER): Payer: BLUE CROSS/BLUE SHIELD

## 2015-05-04 DIAGNOSIS — J309 Allergic rhinitis, unspecified: Secondary | ICD-10-CM

## 2015-05-07 ENCOUNTER — Ambulatory Visit (INDEPENDENT_AMBULATORY_CARE_PROVIDER_SITE_OTHER): Payer: BLUE CROSS/BLUE SHIELD

## 2015-05-07 DIAGNOSIS — J309 Allergic rhinitis, unspecified: Secondary | ICD-10-CM | POA: Diagnosis not present

## 2015-05-11 ENCOUNTER — Ambulatory Visit (INDEPENDENT_AMBULATORY_CARE_PROVIDER_SITE_OTHER): Payer: BLUE CROSS/BLUE SHIELD

## 2015-05-11 DIAGNOSIS — J309 Allergic rhinitis, unspecified: Secondary | ICD-10-CM | POA: Diagnosis not present

## 2015-05-14 ENCOUNTER — Ambulatory Visit (INDEPENDENT_AMBULATORY_CARE_PROVIDER_SITE_OTHER): Payer: BLUE CROSS/BLUE SHIELD | Admitting: *Deleted

## 2015-05-14 DIAGNOSIS — J309 Allergic rhinitis, unspecified: Secondary | ICD-10-CM

## 2015-05-18 ENCOUNTER — Ambulatory Visit (INDEPENDENT_AMBULATORY_CARE_PROVIDER_SITE_OTHER): Payer: BLUE CROSS/BLUE SHIELD | Admitting: *Deleted

## 2015-05-18 DIAGNOSIS — J309 Allergic rhinitis, unspecified: Secondary | ICD-10-CM

## 2015-05-25 ENCOUNTER — Ambulatory Visit (INDEPENDENT_AMBULATORY_CARE_PROVIDER_SITE_OTHER): Payer: BLUE CROSS/BLUE SHIELD

## 2015-05-25 DIAGNOSIS — J309 Allergic rhinitis, unspecified: Secondary | ICD-10-CM

## 2015-05-28 ENCOUNTER — Ambulatory Visit (INDEPENDENT_AMBULATORY_CARE_PROVIDER_SITE_OTHER): Payer: BLUE CROSS/BLUE SHIELD

## 2015-05-28 DIAGNOSIS — J309 Allergic rhinitis, unspecified: Secondary | ICD-10-CM

## 2015-05-30 ENCOUNTER — Other Ambulatory Visit: Payer: Self-pay | Admitting: Family Medicine

## 2015-05-30 NOTE — Telephone Encounter (Signed)
Medication filled to pharmacy as requested.   

## 2015-06-01 ENCOUNTER — Ambulatory Visit (INDEPENDENT_AMBULATORY_CARE_PROVIDER_SITE_OTHER): Payer: BLUE CROSS/BLUE SHIELD | Admitting: *Deleted

## 2015-06-01 DIAGNOSIS — J309 Allergic rhinitis, unspecified: Secondary | ICD-10-CM | POA: Diagnosis not present

## 2015-06-05 ENCOUNTER — Ambulatory Visit (INDEPENDENT_AMBULATORY_CARE_PROVIDER_SITE_OTHER): Payer: BLUE CROSS/BLUE SHIELD | Admitting: *Deleted

## 2015-06-05 DIAGNOSIS — J309 Allergic rhinitis, unspecified: Secondary | ICD-10-CM

## 2015-06-08 ENCOUNTER — Ambulatory Visit (INDEPENDENT_AMBULATORY_CARE_PROVIDER_SITE_OTHER): Payer: BLUE CROSS/BLUE SHIELD | Admitting: *Deleted

## 2015-06-08 DIAGNOSIS — J309 Allergic rhinitis, unspecified: Secondary | ICD-10-CM | POA: Diagnosis not present

## 2015-06-25 ENCOUNTER — Ambulatory Visit (INDEPENDENT_AMBULATORY_CARE_PROVIDER_SITE_OTHER): Payer: BLUE CROSS/BLUE SHIELD

## 2015-06-25 DIAGNOSIS — J309 Allergic rhinitis, unspecified: Secondary | ICD-10-CM

## 2015-06-28 HISTORY — PX: COLONOSCOPY: SHX174

## 2015-07-02 ENCOUNTER — Ambulatory Visit (INDEPENDENT_AMBULATORY_CARE_PROVIDER_SITE_OTHER): Payer: BLUE CROSS/BLUE SHIELD

## 2015-07-02 DIAGNOSIS — J309 Allergic rhinitis, unspecified: Secondary | ICD-10-CM

## 2015-07-09 ENCOUNTER — Ambulatory Visit (INDEPENDENT_AMBULATORY_CARE_PROVIDER_SITE_OTHER): Payer: BLUE CROSS/BLUE SHIELD | Admitting: *Deleted

## 2015-07-09 DIAGNOSIS — J309 Allergic rhinitis, unspecified: Secondary | ICD-10-CM | POA: Diagnosis not present

## 2015-07-17 ENCOUNTER — Ambulatory Visit (INDEPENDENT_AMBULATORY_CARE_PROVIDER_SITE_OTHER): Payer: BLUE CROSS/BLUE SHIELD | Admitting: *Deleted

## 2015-07-17 DIAGNOSIS — J309 Allergic rhinitis, unspecified: Secondary | ICD-10-CM

## 2015-07-23 ENCOUNTER — Ambulatory Visit (INDEPENDENT_AMBULATORY_CARE_PROVIDER_SITE_OTHER): Payer: BLUE CROSS/BLUE SHIELD

## 2015-07-23 DIAGNOSIS — J309 Allergic rhinitis, unspecified: Secondary | ICD-10-CM

## 2015-07-30 ENCOUNTER — Ambulatory Visit (INDEPENDENT_AMBULATORY_CARE_PROVIDER_SITE_OTHER): Payer: BLUE CROSS/BLUE SHIELD

## 2015-07-30 DIAGNOSIS — J309 Allergic rhinitis, unspecified: Secondary | ICD-10-CM | POA: Diagnosis not present

## 2015-08-07 ENCOUNTER — Ambulatory Visit (INDEPENDENT_AMBULATORY_CARE_PROVIDER_SITE_OTHER): Payer: BLUE CROSS/BLUE SHIELD

## 2015-08-07 DIAGNOSIS — J309 Allergic rhinitis, unspecified: Secondary | ICD-10-CM | POA: Diagnosis not present

## 2015-08-10 ENCOUNTER — Other Ambulatory Visit: Payer: Self-pay | Admitting: Family Medicine

## 2015-08-10 NOTE — Telephone Encounter (Signed)
Medication filled to pharmacy as requested.   

## 2015-08-13 ENCOUNTER — Ambulatory Visit (INDEPENDENT_AMBULATORY_CARE_PROVIDER_SITE_OTHER): Payer: BLUE CROSS/BLUE SHIELD

## 2015-08-13 DIAGNOSIS — J309 Allergic rhinitis, unspecified: Secondary | ICD-10-CM

## 2015-08-14 ENCOUNTER — Telehealth: Payer: Self-pay | Admitting: Allergy and Immunology

## 2015-08-14 NOTE — Telephone Encounter (Signed)
Went over bill with pt - she understands

## 2015-08-14 NOTE — Telephone Encounter (Signed)
Patient received a bill for a payment she may have already made. Please call her back before close today. She has to work Monday and will be unavailable during business hours.

## 2015-08-21 ENCOUNTER — Ambulatory Visit (INDEPENDENT_AMBULATORY_CARE_PROVIDER_SITE_OTHER): Payer: BLUE CROSS/BLUE SHIELD | Admitting: Allergy and Immunology

## 2015-08-21 ENCOUNTER — Encounter: Payer: Self-pay | Admitting: Allergy and Immunology

## 2015-08-21 ENCOUNTER — Ambulatory Visit (INDEPENDENT_AMBULATORY_CARE_PROVIDER_SITE_OTHER): Payer: BLUE CROSS/BLUE SHIELD | Admitting: *Deleted

## 2015-08-21 VITALS — BP 130/92 | HR 72 | Resp 16 | Ht 61.0 in

## 2015-08-21 DIAGNOSIS — H1010 Acute atopic conjunctivitis, unspecified eye: Secondary | ICD-10-CM

## 2015-08-21 DIAGNOSIS — J309 Allergic rhinitis, unspecified: Secondary | ICD-10-CM | POA: Diagnosis not present

## 2015-08-21 DIAGNOSIS — J4540 Moderate persistent asthma, uncomplicated: Secondary | ICD-10-CM

## 2015-08-21 NOTE — Progress Notes (Signed)
Follow-up Note  Referring Provider: Midge Minium, MD Primary Provider: Annye Asa, MD Date of Office Visit: 08/21/2015  Subjective:   Tina Williams (DOB: [birth year]) is a 55 y.o. female who returns to the [facility] on 08/21/2015 in re-evaluation of the following:  HPI Comments: Tina Williams returns to this clinic in reevaluation of her asthma and allergic rhinoconjunctivitis treated with immunotherapy. She started immunotherapy approximately 6 weeks after last saw her in his clinic for her acute exacerbation. Since that point in time, while consistently using Flonase and Asmanex she is done relatively well without any exacerbations of her asthma requiring a systemic steroid and no need to use a short acting bronchodilator greater than twice a week. However, since the pollen has arrived she's noticed a little bit more problem with her head being congested and her years being a little full and feeling a little dizzy without any obvious vertigo which is her usual symptoms when the springtime comes. She wonders if she can have another systemic steroid shot.   Outpatient Prescriptions Prior to Visit  Medication Sig Dispense Refill  . albuterol (VENTOLIN HFA) 108 (90 BASE) MCG/ACT inhaler Inhale 2 puffs into the lungs every 6 (six) hours as needed.      . DULoxetine (CYMBALTA) 30 MG capsule TAKE 1 CAPSULE (30 MG TOTAL) BY MOUTH DAILY IN ADDITION TO 60MG  FOR TOTAL OF 90MG  30 capsule 3  . DULoxetine (CYMBALTA) 60 MG capsule TAKE 1 CAPSULE (60 MG TOTAL) BY MOUTH DAILY. 30 capsule 3  . fluticasone (FLONASE) 50 MCG/ACT nasal spray Place 2 sprays into both nostrils daily.    . Ibuprofen (ADVIL PO) Take by mouth.    . loratadine (CLARITIN) 10 MG tablet Take 10 mg by mouth daily as needed for allergies.    . Mometasone Furoate (ASMANEX HFA) 200 MCG/ACT AERO Inhale 2 puffs into the lungs 2 (two) times daily. WITH SPACER    . omeprazole (PRILOSEC) 40 MG capsule TAKE 1 CAPSULE (40  MG TOTAL) BY MOUTH DAILY. 30 capsule 6  . azithromycin (ZITHROMAX) 500 MG tablet Take 500 mg by mouth daily. Reported on 08/21/2015    . budesonide-formoterol (SYMBICORT) 160-4.5 MCG/ACT inhaler Inhale 2 puffs into the lungs 2 (two) times daily. (Patient not taking: Reported on 08/21/2015) 1 Inhaler 11  . dextromethorphan-guaiFENesin (MUCINEX DM) 30-600 MG per 12 hr tablet Take 1 tablet by mouth 2 (two) times daily as needed for cough. Reported on 08/21/2015    . Multiple Vitamins-Minerals (MULTIVITAMIN PO) Take 1 tablet by mouth daily. Reported on 08/21/2015    . predniSONE (DELTASONE) 10 MG tablet Take 20 mg by mouth daily with breakfast. Reported on 08/21/2015    . Triamcinolone Acetonide (NASACORT ALLERGY 24HR NA) Place into the nose. Reported on 08/21/2015     No facility-administered medications prior to visit.    Past Medical History  Diagnosis Date  . Non-allergic rhinitis   . Sinusitis acute   . Asthma   . URI (upper respiratory infection)   . Obesity   . Early menopause   . Hyperlipidemia   . Personal history of colonic adenoma  11/26/2009    Past Surgical History  Procedure Laterality Date  . Cesarean section    . Knee surgery      right    Allergies  Allergen Reactions  . Amoxicillin Hives  . Clarithromycin     REACTION: hives  . Naproxen Sodium     REACTION: HIVES  . Penicillins  REACTION: hives  . Sulfa Antibiotics     Review of systems negative except as noted in HPI / PMHx or noted below:  Review of Systems  Constitutional: Negative.   HENT: Negative.   Eyes: Negative.   Respiratory: Negative.   Cardiovascular: Negative.   Gastrointestinal: Negative.   Genitourinary: Negative.   Musculoskeletal: Negative.   Skin: Negative.   Neurological: Negative.   Endo/Heme/Allergies: Negative.   Psychiatric/Behavioral: Negative.      Objective:   Filed Vitals:   08/21/15 1044  BP: 130/92  Pulse: 72  Resp: 16   Height: 5\' 1"  (154.9 cm)      Physical  Exam  Constitutional: She is well-developed, well-nourished, and in no distress.  HENT:  Head: Normocephalic.  Right Ear: Tympanic membrane, external ear and ear canal normal.  Left Ear: Tympanic membrane, external ear and ear canal normal.  Nose: Nose normal. No mucosal edema or rhinorrhea.  Mouth/Throat: Uvula is midline, oropharynx is clear and moist and mucous membranes are normal. No oropharyngeal exudate.  Eyes: Conjunctivae are normal.  Neck: Trachea normal. No tracheal tenderness present. No tracheal deviation present. No thyromegaly present.  Cardiovascular: Normal rate, regular rhythm, S1 normal, S2 normal and normal heart sounds.   No murmur heard. Pulmonary/Chest: Breath sounds normal. No stridor. No respiratory distress. She has no wheezes. She has no rales.  Musculoskeletal: She exhibits no edema.  Lymphadenopathy:       Head (right side): No tonsillar adenopathy present.       Head (left side): No tonsillar adenopathy present.    She has no cervical adenopathy.    She has no axillary adenopathy.  Neurological: She is alert. Gait normal.  Skin: No rash noted. She is not diaphoretic. No erythema. Nails show no clubbing.  Psychiatric: Mood and affect normal.    Diagnostics:    Spirometry was performed and demonstrated an FEV1 of 2.12 at 87 % of predicted.  The patient had an Asthma Control Test with the following results: ACT Total Score: 20.    Assessment and Plan:   1. Asthma, moderate persistent, well-controlled   2. Allergic rhinoconjunctivitis     1. Continue Immunotherapy and Epi-Pen  2. Continue Flonase 1 spray each nostril two times per day   3. Continue Asmanex 200 HFA two inhalations two times per day.   4. Continue claritin / zyrtec / allegra if needed  5. Continue ProairHFA if needed  6. Return in 4 months or earlier if problem - taper?  7. Contact clinic if spring gets too active  [name]'s doing relatively well and hopefully she will continue to  do this well as we go through this upcoming springtime season.I had a talk with her today about the use of systemic steroids and I counseled her against using these medications very commonly and only reserve them for when she develop significant problems with her allergic disease in the face of using  Her medical therapy and immunotherapy. I'm not sure she's going to have much protection this spring with her immunotherapy but certainly she should do much better with this form of therapy. I will see her back in this clinic after the springtime resolves and if she is doing okay we will see if we can consolidate both her Asmanex and Flonase.  Allena Katz, MD [place]

## 2015-08-21 NOTE — Patient Instructions (Signed)
  1. Continue Immunotherapy and Epi-Pen  2. Continue Flonase 1 spray each nostril two times per day   3. Continue Asmanex 200 HFA two inhalations two times per day.   4. Continue claritin / zyrtec / allegra if needed  5. Continue ProairHFA if needed  6. Return in 4 months or earlier if problem - taper?  7. Contact clinic if spring gets too active

## 2015-08-24 ENCOUNTER — Ambulatory Visit: Payer: BLUE CROSS/BLUE SHIELD | Admitting: Allergy and Immunology

## 2015-09-04 ENCOUNTER — Ambulatory Visit (INDEPENDENT_AMBULATORY_CARE_PROVIDER_SITE_OTHER): Payer: BLUE CROSS/BLUE SHIELD | Admitting: *Deleted

## 2015-09-04 ENCOUNTER — Other Ambulatory Visit: Payer: Self-pay | Admitting: *Deleted

## 2015-09-04 DIAGNOSIS — J309 Allergic rhinitis, unspecified: Secondary | ICD-10-CM

## 2015-09-04 MED ORDER — FLUTICASONE PROPIONATE 50 MCG/ACT NA SUSP: NASAL | Status: DC

## 2015-09-04 MED ORDER — CETIRIZINE HCL 10 MG PO TABS: ORAL_TABLET | ORAL | Status: DC

## 2015-09-11 ENCOUNTER — Ambulatory Visit (INDEPENDENT_AMBULATORY_CARE_PROVIDER_SITE_OTHER): Payer: BLUE CROSS/BLUE SHIELD | Admitting: *Deleted

## 2015-09-11 DIAGNOSIS — J309 Allergic rhinitis, unspecified: Secondary | ICD-10-CM

## 2015-09-24 ENCOUNTER — Ambulatory Visit (INDEPENDENT_AMBULATORY_CARE_PROVIDER_SITE_OTHER): Payer: BLUE CROSS/BLUE SHIELD | Admitting: *Deleted

## 2015-09-24 DIAGNOSIS — J309 Allergic rhinitis, unspecified: Secondary | ICD-10-CM | POA: Diagnosis not present

## 2015-09-28 ENCOUNTER — Ambulatory Visit (INDEPENDENT_AMBULATORY_CARE_PROVIDER_SITE_OTHER): Payer: BLUE CROSS/BLUE SHIELD | Admitting: *Deleted

## 2015-09-28 DIAGNOSIS — J309 Allergic rhinitis, unspecified: Secondary | ICD-10-CM | POA: Diagnosis not present

## 2015-10-05 ENCOUNTER — Ambulatory Visit (INDEPENDENT_AMBULATORY_CARE_PROVIDER_SITE_OTHER): Payer: BLUE CROSS/BLUE SHIELD | Admitting: *Deleted

## 2015-10-05 DIAGNOSIS — J309 Allergic rhinitis, unspecified: Secondary | ICD-10-CM | POA: Diagnosis not present

## 2015-10-14 ENCOUNTER — Other Ambulatory Visit: Payer: Self-pay | Admitting: General Practice

## 2015-10-14 MED ORDER — DULOXETINE HCL 30 MG PO CPEP: ORAL_CAPSULE | ORAL | Status: DC

## 2015-10-15 ENCOUNTER — Telehealth: Payer: Self-pay | Admitting: Allergy and Immunology

## 2015-10-15 NOTE — Telephone Encounter (Signed)
She spoke with someone previously about this. She had received a large bill but was told not to worry about it because of her HRA. She has recently received a bill for $400 on Saturday. She says that there is still money on her HRA and doesn't understand why she is getting a bill. If possible please call her today 10/15/15. She is only available this afternoon as she works till 5 and got off early today.

## 2015-10-15 NOTE — Telephone Encounter (Signed)
SAYS HRA HAS PAID - I TOLD HER I WOULD NEED TO LOOK FOR PAYMENTS

## 2015-10-16 ENCOUNTER — Ambulatory Visit (INDEPENDENT_AMBULATORY_CARE_PROVIDER_SITE_OTHER): Payer: BLUE CROSS/BLUE SHIELD | Admitting: *Deleted

## 2015-10-16 DIAGNOSIS — J309 Allergic rhinitis, unspecified: Secondary | ICD-10-CM | POA: Diagnosis not present

## 2015-10-21 ENCOUNTER — Other Ambulatory Visit: Payer: Self-pay | Admitting: Allergy and Immunology

## 2015-10-23 ENCOUNTER — Encounter: Payer: Self-pay | Admitting: Family Medicine

## 2015-10-23 ENCOUNTER — Ambulatory Visit (INDEPENDENT_AMBULATORY_CARE_PROVIDER_SITE_OTHER): Payer: BLUE CROSS/BLUE SHIELD | Admitting: Family Medicine

## 2015-10-23 ENCOUNTER — Ambulatory Visit (INDEPENDENT_AMBULATORY_CARE_PROVIDER_SITE_OTHER): Payer: BLUE CROSS/BLUE SHIELD | Admitting: *Deleted

## 2015-10-23 VITALS — BP 122/80 | HR 78 | Temp 98.0°F | Resp 16 | Ht 61.0 in | Wt 175.0 lb

## 2015-10-23 DIAGNOSIS — Z23 Encounter for immunization: Secondary | ICD-10-CM | POA: Diagnosis not present

## 2015-10-23 DIAGNOSIS — J309 Allergic rhinitis, unspecified: Secondary | ICD-10-CM | POA: Diagnosis not present

## 2015-10-23 DIAGNOSIS — Z0000 Encounter for general adult medical examination without abnormal findings: Secondary | ICD-10-CM | POA: Diagnosis not present

## 2015-10-23 DIAGNOSIS — Z1211 Encounter for screening for malignant neoplasm of colon: Secondary | ICD-10-CM | POA: Diagnosis not present

## 2015-10-23 LAB — BASIC METABOLIC PANEL
BUN: 11 mg/dL (ref 6–23)
CO2: 29 mEq/L (ref 19–32)
Calcium: 9.2 mg/dL (ref 8.4–10.5)
Chloride: 104 mEq/L (ref 96–112)
Creatinine, Ser: 0.58 mg/dL (ref 0.40–1.20)
GFR: 114.77 mL/min (ref >60.00)
GLUCOSE: 92 mg/dL (ref 70–99)
POTASSIUM: 3.6 meq/L (ref 3.5–5.1)
SODIUM: 139 meq/L (ref 135–145)

## 2015-10-23 LAB — LIPID PANEL
CHOL/HDL RATIO: 4
CHOLESTEROL: 252 mg/dL — AB (ref 0–200)
HDL: 64.4 mg/dL (ref >39.00)
LDL CALC: 174 mg/dL — AB (ref 0–99)
NONHDL: 187.84
Triglycerides: 68 mg/dL (ref 0.0–149.0)
VLDL: 13.6 mg/dL (ref 0.0–40.0)

## 2015-10-23 LAB — CBC WITH DIFFERENTIAL/PLATELET
BASOS PCT: 0.5 % (ref 0.0–3.0)
Basophils Absolute: 0 10*3/uL (ref 0.0–0.1)
EOS PCT: 1.9 % (ref 0.0–5.0)
Eosinophils Absolute: 0.1 10*3/uL (ref 0.0–0.7)
HEMATOCRIT: 40.2 % (ref 36.0–46.0)
Hemoglobin: 13.3 g/dL (ref 12.0–15.0)
LYMPHS ABS: 1.8 10*3/uL (ref 0.7–4.0)
LYMPHS PCT: 24.1 % (ref 12.0–46.0)
MCHC: 33 g/dL (ref 30.0–36.0)
MCV: 83.5 fl (ref 78.0–100.0)
MONOS PCT: 8.8 % (ref 3.0–12.0)
Monocytes Absolute: 0.7 10*3/uL (ref 0.1–1.0)
NEUTROS ABS: 4.9 10*3/uL (ref 1.4–7.7)
NEUTROS PCT: 64.7 % (ref 43.0–77.0)
PLATELETS: 414 10*3/uL — AB (ref 150.0–400.0)
RBC: 4.81 Mil/uL (ref 3.87–5.11)
RDW: 15.3 % (ref 11.5–15.5)
WBC: 7.5 10*3/uL (ref 4.0–10.5)

## 2015-10-23 LAB — HEPATIC FUNCTION PANEL
ALK PHOS: 70 U/L (ref 39–117)
ALT: 13 U/L (ref 0–35)
AST: 14 U/L (ref 0–37)
Albumin: 4.3 g/dL (ref 3.5–5.2)
BILIRUBIN DIRECT: 0.1 mg/dL (ref 0.0–0.3)
BILIRUBIN TOTAL: 0.7 mg/dL (ref 0.2–1.2)
Total Protein: 7.1 g/dL (ref 6.0–8.3)

## 2015-10-23 LAB — TSH: TSH: 0.93 u[IU]/mL (ref 0.35–4.50)

## 2015-10-23 LAB — VITAMIN D 25 HYDROXY (VIT D DEFICIENCY, FRACTURES): VITD: 27.6 ng/mL — ABNORMAL LOW (ref 30.00–100.00)

## 2015-10-23 NOTE — Addendum Note (Signed)
Addended by: Davis Gourd on: 10/23/2015 02:19 PM   Modules accepted: Orders

## 2015-10-23 NOTE — Patient Instructions (Signed)
Follow up in 1 year or as needed We'll notify you of your lab results and make any changes if needed Continue to work on healthy diet and regular exercise- you can do it! We'll call you with your GI appt for the colonoscopy Call with any questions or concerns Happy Spring!!!

## 2015-10-23 NOTE — Progress Notes (Signed)
Pre visit review using our clinic review tool, if applicable. No additional management support is needed unless otherwise documented below in the visit note. 

## 2015-10-23 NOTE — Progress Notes (Signed)
   Subjective:    Patient ID: Tina Williams, female    DOB: 06-18-1961, 55 y.o.   MRN: [id number]  HPI CPE- overdue for recall colonoscopy.  Has appt for pap and mammo next month.  Due for Tdap.   Review of Systems Patient reports no vision/ hearing changes, adenopathy,fever, weight change,  persistant/recurrent hoarseness , swallowing issues, chest pain, palpitations, edema, persistant/recurrent cough, hemoptysis, dyspnea (rest/exertional/paroxysmal nocturnal), gastrointestinal bleeding (melena, rectal bleeding), abdominal pain, significant heartburn, bowel changes, GU symptoms (dysuria, hematuria, incontinence), Gyn symptoms (abnormal  bleeding, pain),  syncope, focal weakness, memory loss, numbness & tingling, skin/hair/nail changes, abnormal bruising or bleeding, anxiety, or depression.     Objective:   Physical Exam General Appearance:    Alert, cooperative, no distress, appears stated age, obese  Head:    Normocephalic, without obvious abnormality, atraumatic  Eyes:    PERRL, conjunctiva/corneas clear, EOM's intact, fundi    benign, both eyes  Ears:    Normal TM's and external ear canals, both ears  Nose:   Nares normal, septum midline, mucosa normal, no drainage    or sinus tenderness  Throat:   Lips, mucosa, and tongue normal; teeth and gums normal  Neck:   Supple, symmetrical, trachea midline, no adenopathy;    Thyroid: no enlargement/tenderness/nodules  Back:     Symmetric, no curvature, ROM normal, no CVA tenderness  Lungs:     Clear to auscultation bilaterally, respirations unlabored  Chest Wall:    No tenderness or deformity   Heart:    Regular rate and rhythm, S1 and S2 normal, no murmur, rub   or gallop  Breast Exam:    Deferred to GYN  Abdomen:     Soft, non-tender, bowel sounds active all four quadrants,    no masses, no organomegaly  Genitalia:    Deferred to GYN  Rectal:    Extremities:   Extremities normal, atraumatic, no cyanosis or edema  Pulses:   2+ and  symmetric all extremities  Skin:   Skin color, texture, turgor normal, no rashes or lesions  Lymph nodes:   Cervical, supraclavicular, and axillary nodes normal  Neurologic:   CNII-XII intact, normal strength, sensation and reflexes    throughout          Assessment & Plan:

## 2015-10-23 NOTE — Assessment & Plan Note (Addendum)
Pt's PE WNL w/ exception of obesity.  UTD on GYN (appt next month).  Due for repeat colonoscopy- order entered.  Tdap updated.  Check labs.  Anticipatory guidance provided.

## 2015-10-26 ENCOUNTER — Ambulatory Visit (INDEPENDENT_AMBULATORY_CARE_PROVIDER_SITE_OTHER): Payer: BLUE CROSS/BLUE SHIELD | Admitting: Allergy and Immunology

## 2015-10-26 ENCOUNTER — Encounter: Payer: Self-pay | Admitting: Internal Medicine

## 2015-10-26 ENCOUNTER — Encounter: Payer: Self-pay | Admitting: Allergy and Immunology

## 2015-10-26 ENCOUNTER — Telehealth: Payer: Self-pay | Admitting: Family Medicine

## 2015-10-26 VITALS — BP 120/78 | HR 100 | Resp 18

## 2015-10-26 DIAGNOSIS — H1010 Acute atopic conjunctivitis, unspecified eye: Secondary | ICD-10-CM

## 2015-10-26 DIAGNOSIS — J4541 Moderate persistent asthma with (acute) exacerbation: Secondary | ICD-10-CM | POA: Diagnosis not present

## 2015-10-26 DIAGNOSIS — J309 Allergic rhinitis, unspecified: Secondary | ICD-10-CM | POA: Diagnosis not present

## 2015-10-26 MED ORDER — DULOXETINE HCL 30 MG PO CPEP: ORAL_CAPSULE | ORAL | Status: DC

## 2015-10-26 MED ORDER — DULOXETINE HCL 60 MG PO CPEP: 60.0000 mg | ORAL_CAPSULE | Freq: Every day | ORAL | Status: DC

## 2015-10-26 MED ORDER — METHYLPREDNISOLONE ACETATE 80 MG/ML IJ SUSP
80.0000 mg | Freq: Once | INTRAMUSCULAR | Status: AC
  Administered 2015-10-26: 80 mg via INTRAMUSCULAR

## 2015-10-26 NOTE — Telephone Encounter (Signed)
Pt needs refill on duloxetine 60mg  and 30mg , [facility] in [place]

## 2015-10-26 NOTE — Progress Notes (Signed)
Follow-up Note  Referring Provider: Midge Minium, MD Primary Provider: Annye Asa, MD Date of Office Visit: 10/26/2015  Subjective:   Tina Williams (DOB: 1961-01-13) is a 55 y.o. female who returns to the [facility] on 10/26/2015 in re-evaluation of the following:  HPI: Tina Williams presents to this clinic noting that over the course the past week or so she's developed problems with head fullness and frontal pain and coughing and chest tightness and increased use of her bronchodilator without any ugly nasal discharge or fever. She has had some slight sneezing. She was doing relatively well using a nasal steroid and Asmanex on a consistent basis up until this event.    Medication List           ADVIL PO  Take by mouth.     albuterol 108 (90 Base) MCG/ACT inhaler  Commonly known as:  PROVENTIL HFA;VENTOLIN HFA  Inhale 2 puffs into the lungs every 4 (four) hours as needed for wheezing or shortness of breath.     ASMANEX HFA 200 MCG/ACT Aero  Generic drug:  Mometasone Furoate  USE 2 PUFFS 2 TIMES A DAY WITH SPACER...RINSE, GARGLE, & SPIT AFTER USE     cetirizine 10 MG tablet  Commonly known as:  ZYRTEC  TAKE ONE TABLET ONCE DAILY IF NEEDED     DULoxetine 30 MG capsule  Commonly known as:  CYMBALTA  TAKE 1 CAPSULE (30 MG TOTAL) BY MOUTH DAILY IN ADDITION TO 60MG  FOR TOTAL OF 90MG      DULoxetine 60 MG capsule  Commonly known as:  CYMBALTA  Take 1 capsule (60 mg total) by mouth daily.     fluticasone 50 MCG/ACT nasal spray  Commonly known as:  FLONASE  USE ONE SPRAY IN EACH NOSTRIL TWICE DAILY     loratadine 10 MG tablet  Commonly known as:  CLARITIN  Take 10 mg by mouth daily as needed for allergies. Reported on 10/26/2015     MULTIVITAMIN PO  Take 1 tablet by mouth daily. Reported on 10/23/2015     omeprazole 40 MG capsule  Commonly known as:  PRILOSEC  TAKE 1 CAPSULE (40 MG TOTAL) BY MOUTH DAILY.        Past Medical History  Diagnosis  Date  . Non-allergic rhinitis   . Sinusitis acute   . Asthma   . URI (upper respiratory infection)   . Obesity   . Early menopause   . Hyperlipidemia   . Personal history of colonic adenoma  11/26/2009    Past Surgical History  Procedure Laterality Date  . Cesarean section    . Knee surgery      right    Allergies  Allergen Reactions  . Amoxicillin Hives  . Clarithromycin     REACTION: hives  . Naproxen Sodium     REACTION: HIVES  . Penicillins     REACTION: hives  . Sulfa Antibiotics Hives    Review of systems negative except as noted in HPI / PMHx or noted below:  Review of Systems  Constitutional: Negative.   HENT: Negative.   Eyes: Negative.   Respiratory: Negative.   Cardiovascular: Negative.   Gastrointestinal: Negative.   Genitourinary: Negative.   Musculoskeletal: Negative.   Skin: Negative.   Neurological: Negative.   Endo/Heme/Allergies: Negative.   Psychiatric/Behavioral: Negative.      Objective:   Filed Vitals:   10/26/15 1124  BP: 120/78  Pulse: 100  Resp: 18  Physical Exam  Constitutional: She is well-developed, well-nourished, and in no distress.  Nasal voice  HENT:  Head: Normocephalic.  Right Ear: Tympanic membrane, external ear and ear canal normal.  Left Ear: Tympanic membrane, external ear and ear canal normal.  Nose: Mucosal edema present. No rhinorrhea.  Mouth/Throat: Uvula is midline, oropharynx is clear and moist and mucous membranes are normal. No oropharyngeal exudate.  Eyes: Conjunctivae are normal.  Neck: Trachea normal. No tracheal tenderness present. No tracheal deviation present. No thyromegaly present.  Cardiovascular: Normal rate, regular rhythm, S1 normal, S2 normal and normal heart sounds.   No murmur heard. Pulmonary/Chest: Breath sounds normal. No stridor. No respiratory distress. She has no wheezes. She has no rales.  Musculoskeletal: She exhibits no edema.  Lymphadenopathy:       Head (right  side): No tonsillar adenopathy present.       Head (left side): No tonsillar adenopathy present.    She has no cervical adenopathy.  Neurological: She is alert. Gait normal.  Skin: No rash noted. She is not diaphoretic. No erythema. Nails show no clubbing.  Psychiatric: Mood and affect normal.    Diagnostics:    Spirometry was performed and demonstrated an FEV1 of 89 at 2.16 % of predicted.  The patient had an Asthma Control Test with the following results: ACT Total Score: 13.    Assessment and Plan:   1. Moderate persistent asthma, with acute exacerbation   2. Allergic rhinoconjunctivitis     1. Continue Immunotherapy and Epi-Pen  2. Continue Flonase 1 spray each nostril two times per day   3. Continue Asmanex 200 HFA two inhalations two times per day.   4. Continue claritin / zyrtec / allegra if needed  5. Continue ProairHFA if needed  6. Depo-Medrol 80 IM delivered in clinic today  7. Return in 6 months or earlier if problem  Even in the face of immunotherapy and preventative medications it does appear as though Tina Williams is developing an exacerbation of her allergic rhino conjunctivitis and her asthma for which I will give her systemic steroid. Hopefully this will be the last spring season that we'll need to use a systemic steroid as her immunotherapy will start to take hold. I will see her back in this clinic in 6 months or earlier if there is a problem.  Allena Katz, MD [place]

## 2015-10-26 NOTE — Patient Instructions (Addendum)
  1. Continue Immunotherapy and Epi-Pen  2. Continue Flonase 1 spray each nostril two times per day   3. Continue Asmanex 200 HFA two inhalations two times per day.   4. Continue claritin / zyrtec / allegra if needed  5. Continue ProairHFA if needed  6. Depo-Medrol 80 IM delivered in clinic today  7. Return in 6 months or earlier if problem

## 2015-10-26 NOTE — Telephone Encounter (Signed)
Medication filled to pharmacy as requested.   

## 2015-11-03 ENCOUNTER — Other Ambulatory Visit: Payer: Self-pay | Admitting: General Practice

## 2015-11-03 MED ORDER — OMEPRAZOLE 40 MG PO CPDR: DELAYED_RELEASE_CAPSULE | ORAL | Status: DC

## 2015-11-09 ENCOUNTER — Ambulatory Visit (INDEPENDENT_AMBULATORY_CARE_PROVIDER_SITE_OTHER): Payer: BLUE CROSS/BLUE SHIELD | Admitting: *Deleted

## 2015-11-09 DIAGNOSIS — J309 Allergic rhinitis, unspecified: Secondary | ICD-10-CM

## 2015-11-12 DIAGNOSIS — J302 Other seasonal allergic rhinitis: Secondary | ICD-10-CM | POA: Diagnosis not present

## 2015-11-13 DIAGNOSIS — J301 Allergic rhinitis due to pollen: Secondary | ICD-10-CM | POA: Diagnosis not present

## 2015-11-16 ENCOUNTER — Ambulatory Visit (INDEPENDENT_AMBULATORY_CARE_PROVIDER_SITE_OTHER): Payer: BLUE CROSS/BLUE SHIELD | Admitting: *Deleted

## 2015-11-16 DIAGNOSIS — J309 Allergic rhinitis, unspecified: Secondary | ICD-10-CM | POA: Diagnosis not present

## 2015-11-18 ENCOUNTER — Telehealth: Payer: Self-pay | Admitting: Allergy and Immunology

## 2015-11-18 NOTE — Telephone Encounter (Signed)
WENT OVER BILL WITH PT - SHE SAYS HER HSA HAS PAID - SHE WILL SEND ME COPIES

## 2015-11-18 NOTE — Telephone Encounter (Signed)
Please call back regarding statement and bill due. Pt says she keeps getting billed wrong.

## 2015-12-03 ENCOUNTER — Ambulatory Visit (INDEPENDENT_AMBULATORY_CARE_PROVIDER_SITE_OTHER): Payer: BLUE CROSS/BLUE SHIELD | Admitting: *Deleted

## 2015-12-03 DIAGNOSIS — J309 Allergic rhinitis, unspecified: Secondary | ICD-10-CM | POA: Diagnosis not present

## 2015-12-14 ENCOUNTER — Ambulatory Visit (INDEPENDENT_AMBULATORY_CARE_PROVIDER_SITE_OTHER): Payer: BLUE CROSS/BLUE SHIELD | Admitting: *Deleted

## 2015-12-14 ENCOUNTER — Telehealth: Payer: Self-pay | Admitting: Family Medicine

## 2015-12-14 DIAGNOSIS — J309 Allergic rhinitis, unspecified: Secondary | ICD-10-CM

## 2015-12-14 NOTE — Telephone Encounter (Signed)
Pt states that since she has been taking Vit D that she has noticed that she is bruising easy and has purple spots under the skin that concern her. Pt asking are the two related.

## 2015-12-16 ENCOUNTER — Other Ambulatory Visit: Payer: Self-pay | Admitting: General Practice

## 2015-12-16 MED ORDER — DULOXETINE HCL 30 MG PO CPEP: ORAL_CAPSULE | ORAL | Status: DC

## 2015-12-18 ENCOUNTER — Ambulatory Visit (AMBULATORY_SURGERY_CENTER): Payer: Self-pay | Admitting: *Deleted

## 2015-12-18 VITALS — Ht 61.0 in | Wt 176.2 lb

## 2015-12-18 DIAGNOSIS — Z86010 Personal history of colonic polyps: Secondary | ICD-10-CM

## 2015-12-18 NOTE — Telephone Encounter (Signed)
Patient notified of PCP recommendations and is agreement and expresses an understanding. Will call back if it does not get any better.

## 2015-12-18 NOTE — Telephone Encounter (Signed)
Actually, a deficiency in Vit d is linked to bruising and not the supplements.  Is she taking more aspirin or ibuprofen (which are the usual culprits)?

## 2015-12-18 NOTE — Telephone Encounter (Signed)
Called patient and LMOVM to return call.     

## 2015-12-18 NOTE — Progress Notes (Signed)
No allergies to eggs or soy. No problems with anesthesia.  Pt given Emmi instructions for colonoscopy  No oxygen use  No diet drug use  

## 2015-12-22 ENCOUNTER — Encounter: Payer: Self-pay | Admitting: Internal Medicine

## 2015-12-25 ENCOUNTER — Ambulatory Visit: Payer: BLUE CROSS/BLUE SHIELD | Admitting: Allergy and Immunology

## 2015-12-31 ENCOUNTER — Ambulatory Visit (INDEPENDENT_AMBULATORY_CARE_PROVIDER_SITE_OTHER): Payer: BLUE CROSS/BLUE SHIELD | Admitting: *Deleted

## 2015-12-31 DIAGNOSIS — J309 Allergic rhinitis, unspecified: Secondary | ICD-10-CM

## 2016-01-01 ENCOUNTER — Ambulatory Visit (AMBULATORY_SURGERY_CENTER): Payer: BLUE CROSS/BLUE SHIELD | Admitting: Internal Medicine

## 2016-01-01 ENCOUNTER — Encounter: Payer: Self-pay | Admitting: Internal Medicine

## 2016-01-01 VITALS — BP 123/60 | HR 71 | Temp 97.8°F | Resp 15 | Ht 61.0 in | Wt 176.0 lb

## 2016-01-01 DIAGNOSIS — D125 Benign neoplasm of sigmoid colon: Secondary | ICD-10-CM

## 2016-01-01 DIAGNOSIS — Z86010 Personal history of colonic polyps: Secondary | ICD-10-CM | POA: Diagnosis present

## 2016-01-01 MED ORDER — SODIUM CHLORIDE 0.9 % IV SOLN: 500.0000 mL | INTRAVENOUS | Status: DC

## 2016-01-01 NOTE — Op Note (Signed)
[facility] Patient Name: Tina Williams Procedure Date: 01/01/2016 9:17 AM MRN: [id number] Endoscopist: Gatha Mayer , MD Age: 55 Referring MD:  Date of Birth: 1960-10-02 Gender: Female Account #: [id number] Procedure:                Colonoscopy Indications:              Surveillance: Personal history of adenomatous                            polyps on last colonoscopy > 5 years ago Medicines:                Propofol per Anesthesia, Monitored Anesthesia Care Procedure:                Pre-Anesthesia Assessment:                           - Prior to the procedure, a History and Physical                            was performed, and patient medications and                            allergies were reviewed. The patient's tolerance of                            previous anesthesia was also reviewed. The risks                            and benefits of the procedure and the sedation                            options and risks were discussed with the patient.                            All questions were answered, and informed consent                            was obtained. Prior Anticoagulants: The patient has                            taken no previous anticoagulant or antiplatelet                            agents. ASA Grade Assessment: II - A patient with                            mild systemic disease. After reviewing the risks                            and benefits, the patient was deemed in                            satisfactory condition to undergo the procedure.  After obtaining informed consent, the colonoscope                            was passed under direct vision. Throughout the                            procedure, the patient's blood pressure, pulse, and                            oxygen saturations were monitored continuously. The                            Model PCF-H190DL [id number]) scope was introduced   through the anus and advanced to the the cecum,                            identified by appendiceal orifice and ileocecal                            valve. The colonoscopy was performed without                            difficulty. The patient tolerated the procedure                            well. The quality of the bowel preparation was                            good. The bowel preparation used was Miralax. The                            ileocecal valve, appendiceal orifice, and rectum                            were photographed. Scope In: 9:26:37 AM Scope Out: 9:37:10 AM Scope Withdrawal Time: 0 hours 8 minutes 4 seconds  Total Procedure Duration: 0 hours 10 minutes 33 seconds  Findings:                 The perianal and digital rectal examinations were                            normal.                           A 8 mm polyp was found in the sigmoid colon. The                            polyp was sessile. The polyp was removed with a                            cold snare. Resection and retrieval were complete.                            Verification of patient identification  for the                            specimen was done. Estimated blood loss was minimal.                           A diffuse area of moderate melanosis was found in                            the entire colon.                           The exam was otherwise without abnormality on                            direct and retroflexion views. Complications:            No immediate complications. Estimated Blood Loss:     Estimated blood loss was minimal. Impression:               - One 8 mm polyp in the sigmoid colon, removed with                            a cold snare. Resected and retrieved.                           - Melanosis in the colon.                           - The examination was otherwise normal on direct                            and retroflexion views.                           - Personal history  of colonic polyps. Recommendation:           - Patient has a contact number available for                            emergencies. The signs and symptoms of potential                            delayed complications were discussed with the                            patient. Return to normal activities tomorrow.                            Written discharge instructions were provided to the                            patient.                           - Resume previous diet.                           -  Continue present medications.                           - Repeat colonoscopy is recommended. The                            colonoscopy date will be determined after pathology                            results from today's exam become available for                            review. Gatha Mayer, MD 01/01/2016 9:43:45 AM This report has been signed electronically.

## 2016-01-01 NOTE — Progress Notes (Signed)
Report to PACU, RN, vss, BBS= Clear.  

## 2016-01-01 NOTE — Progress Notes (Signed)
Called to room to assist during endoscopic procedure.  Patient ID and intended procedure confirmed with present staff. Received instructions for my participation in the procedure from the performing physician.  

## 2016-01-01 NOTE — Patient Instructions (Addendum)
 I found and removed one small polyp that looks benign.  I will let you know pathology results and when to have another routine colonoscopy by mail.  I appreciate the opportunity to care for you. Lisanne Ponce E. Isabele Lollar, MD, FACG  Discharge instructions given. Handout on polyps. Resume previous medications. YOU HAD AN ENDOSCOPIC PROCEDURE TODAY AT THE [facility]:   Refer to the procedure report that was given to you for any specific questions about what was found during the examination.  If the procedure report does not answer your questions, please call your gastroenterologist to clarify.  If you requested that your care partner not be given the details of your procedure findings, then the procedure report has been included in a sealed envelope for you to review at your convenience later.  YOU SHOULD EXPECT: Some feelings of bloating in the abdomen. Passage of more gas than usual.  Walking can help get rid of the air that was put into your GI tract during the procedure and reduce the bloating. If you had a lower endoscopy (such as a colonoscopy or flexible sigmoidoscopy) you may notice spotting of blood in your stool or on the toilet paper. If you underwent a bowel prep for your procedure, you may not have a normal bowel movement for a few days.  Please Note:  You might notice some irritation and congestion in your nose or some drainage.  This is from the oxygen used during your procedure.  There is no need for concern and it should clear up in a day or so.  SYMPTOMS TO REPORT IMMEDIATELY:   Following lower endoscopy (colonoscopy or flexible sigmoidoscopy):  Excessive amounts of blood in the stool  Significant tenderness or worsening of abdominal pains  Swelling of the abdomen that is new, acute  Fever of 100F or higher  For urgent or emergent issues, a gastroenterologist can be reached at any hour by calling [phone].   DIET: Your first meal following the procedure  should be a small meal and then it is ok to progress to your normal diet. Heavy or fried foods are harder to digest and may make you feel nauseous or bloated.  Likewise, meals heavy in dairy and vegetables can increase bloating.  Drink plenty of fluids but you should avoid alcoholic beverages for 24 hours.  ACTIVITY:  You should plan to take it easy for the rest of today and you should NOT DRIVE or use heavy machinery until tomorrow (because of the sedation medicines used during the test).    FOLLOW UP: Our staff will call the number listed on your records the next business day following your procedure to check on you and address any questions or concerns that you may have regarding the information given to you following your procedure. If we do not reach you, we will leave a message.  However, if you are feeling well and you are not experiencing any problems, there is no need to return our call.  We will assume that you have returned to your regular daily activities without incident.  If any biopsies were taken you will be contacted by phone or by letter within the next 1-3 weeks.  Please call us at [phone] if you have not heard about the biopsies in 3 weeks.    SIGNATURES/CONFIDENTIALITY: You and/or your care partner have signed paperwork which will be entered into your electronic medical record.  These signatures attest to the fact that that the information above on   on your After Visit Summary has been reviewed and is understood.  Full responsibility of the confidentiality of this discharge information lies with you and/or your care-partner.

## 2016-01-04 ENCOUNTER — Telehealth: Payer: Self-pay

## 2016-01-04 ENCOUNTER — Ambulatory Visit (INDEPENDENT_AMBULATORY_CARE_PROVIDER_SITE_OTHER): Payer: BLUE CROSS/BLUE SHIELD

## 2016-01-04 DIAGNOSIS — J309 Allergic rhinitis, unspecified: Secondary | ICD-10-CM | POA: Diagnosis not present

## 2016-01-04 NOTE — Telephone Encounter (Signed)
  Follow up Call-  Call back number 01/01/2016 01/01/2016  Post procedure Call Back phone  # [phone]  Permission to leave phone message Yes -     Patient questions:  Do you have a fever, pain , or abdominal swelling? No. Pain Score  0 *  Have you tolerated food without any problems? Yes  Have you been able to return to your normal activities? Yes.    Do you have any questions about your discharge instructions: Diet   No. Medications  No. Follow up visit  No.  Do you have questions or concerns about your Care? No.  Actions: * If pain score is 4 or above: No action needed, pain <4.

## 2016-01-11 ENCOUNTER — Encounter: Payer: Self-pay | Admitting: Internal Medicine

## 2016-01-11 ENCOUNTER — Ambulatory Visit (INDEPENDENT_AMBULATORY_CARE_PROVIDER_SITE_OTHER): Payer: BLUE CROSS/BLUE SHIELD | Admitting: *Deleted

## 2016-01-11 DIAGNOSIS — J309 Allergic rhinitis, unspecified: Secondary | ICD-10-CM | POA: Diagnosis not present

## 2016-01-11 DIAGNOSIS — Z86010 Personal history of colonic polyps: Secondary | ICD-10-CM

## 2016-01-11 DIAGNOSIS — Z860101 Personal history of adenomatous and serrated colon polyps: Secondary | ICD-10-CM

## 2016-01-11 HISTORY — DX: Personal history of adenomatous and serrated colon polyps: Z86.0101

## 2016-01-11 HISTORY — DX: Personal history of colonic polyps: Z86.010

## 2016-01-11 NOTE — Progress Notes (Signed)
Quick Note:  8 mm adenoma - repeat colonoscopy 2022 ______

## 2016-01-25 ENCOUNTER — Ambulatory Visit (INDEPENDENT_AMBULATORY_CARE_PROVIDER_SITE_OTHER): Payer: BLUE CROSS/BLUE SHIELD | Admitting: *Deleted

## 2016-01-25 DIAGNOSIS — J309 Allergic rhinitis, unspecified: Secondary | ICD-10-CM

## 2016-02-08 ENCOUNTER — Ambulatory Visit (INDEPENDENT_AMBULATORY_CARE_PROVIDER_SITE_OTHER): Payer: BLUE CROSS/BLUE SHIELD | Admitting: *Deleted

## 2016-02-08 DIAGNOSIS — J309 Allergic rhinitis, unspecified: Secondary | ICD-10-CM

## 2016-02-17 ENCOUNTER — Encounter: Payer: Self-pay | Admitting: Family Medicine

## 2016-02-17 ENCOUNTER — Ambulatory Visit (INDEPENDENT_AMBULATORY_CARE_PROVIDER_SITE_OTHER): Payer: BLUE CROSS/BLUE SHIELD | Admitting: Family Medicine

## 2016-02-17 VITALS — BP 122/82 | HR 90 | Temp 98.3°F | Resp 17 | Ht 61.0 in | Wt 176.0 lb

## 2016-02-17 DIAGNOSIS — J0100 Acute maxillary sinusitis, unspecified: Secondary | ICD-10-CM

## 2016-02-17 DIAGNOSIS — R1030 Lower abdominal pain, unspecified: Secondary | ICD-10-CM | POA: Diagnosis not present

## 2016-02-17 DIAGNOSIS — R1031 Right lower quadrant pain: Secondary | ICD-10-CM

## 2016-02-17 MED ORDER — DOXYCYCLINE HYCLATE 100 MG PO TABS
100.0000 mg | ORAL_TABLET | Freq: Two times a day (BID) | ORAL | 0 refills | Status: DC
Start: 2016-02-17 — End: 2016-04-05

## 2016-02-17 MED ORDER — IBUPROFEN 800 MG PO TABS: ORAL_TABLET | ORAL | 0 refills | Status: DC

## 2016-02-17 NOTE — Patient Instructions (Signed)
Follow up as needed Start the Ibuprofen twice daily- take w/ food Ice or heat the groin- whichever feels better We'll call you with your Sports Med appt for the  Start the Doxy- take twice daily w/ food- for the sinus infection Drink plenty of fluids Restart the Zyrtec daily Call with any questions or concerns Hang in there!!!

## 2016-02-17 NOTE — Progress Notes (Signed)
Pre visit review using our clinic review tool, if applicable. No additional management support is needed unless otherwise documented below in the visit note. 

## 2016-02-17 NOTE — Progress Notes (Signed)
   Subjective:    Patient ID: Tina Williams, female    DOB: February 13, 1961, 55 y.o.   MRN: [id number]  HPI URI- sxs flared when pt completed pred taper 2 weeks ago.  No fevers.  + facial pain- R maxillary sinus.  No nasal drainage- it's stuck.  No cough, no fevers.    Hip pain- R sided, occurred 6 weeks ago.  Occurred after twisting and pt felt a pull in her groin.  Went to [facility]- had xray.  Was given prednisone and tramadol.  Pred taper improved sxs in her buttock but continues to have pain in groin- worse w/ activity or prolonged sitting.  No difficulty w/ sleeping.  Some improvement w/ tylenol.  Pt reports pain is not improving after 6 weeks.   Review of Systems For ROS see HPI     Objective:   Physical Exam  Constitutional: She is oriented to person, place, and time. She appears well-developed and well-nourished. No distress.  HENT:  Head: Normocephalic and atraumatic.  Right Ear: Tympanic membrane normal.  Left Ear: Tympanic membrane normal.  Nose: Mucosal edema and rhinorrhea present. Right sinus exhibits maxillary sinus tenderness. Right sinus exhibits no frontal sinus tenderness. Left sinus exhibits maxillary sinus tenderness. Left sinus exhibits no frontal sinus tenderness.  Mouth/Throat: Uvula is midline and mucous membranes are normal. Posterior oropharyngeal erythema present. No oropharyngeal exudate.  Eyes: Conjunctivae and EOM are normal. Pupils are equal, round, and reactive to light.  Neck: Normal range of motion. Neck supple.  Cardiovascular: Normal rate, regular rhythm and normal heart sounds.   Pulmonary/Chest: Effort normal and breath sounds normal. No respiratory distress. She has no wheezes.  Musculoskeletal: She exhibits tenderness (TTP over R groin, possibly iliopsoas muscle). She exhibits no edema or deformity.  Full ROM of R hip- full flexion/extension, internal/external rotation  Lymphadenopathy:    She has no cervical adenopathy.  Neurological: She is alert and  oriented to person, place, and time.  Skin: Skin is warm and dry. No rash noted. No erythema.  Vitals reviewed.         Assessment & Plan:  Sinusitis- pt's sxs and PE consistent w/ infxn.  Start Doxy due to pt's multiple drug allergies.  Reviewed supportive care and red flags that should prompt return.  Pt expressed understanding and is in agreement w/ plan.   R groin strain- new.  Pt was tx'd w/ Prednisone and Tramadol but completed this ~2 weeks ago.  Continues to have pain.  Full ROM in office.  Start scheduled NSAID- pt reports she has taken Ibuprofen in the past w/o difficulty despite Naproxen allergy.  Refer to sports med.  Discussed possibility of PT- pt wants to discuss w/ Sports Med.  Pt expressed understanding and is in agreement w/ plan.

## 2016-02-23 ENCOUNTER — Ambulatory Visit: Payer: BLUE CROSS/BLUE SHIELD | Admitting: Sports Medicine

## 2016-02-26 ENCOUNTER — Ambulatory Visit (INDEPENDENT_AMBULATORY_CARE_PROVIDER_SITE_OTHER): Payer: BLUE CROSS/BLUE SHIELD | Admitting: Allergy and Immunology

## 2016-02-26 ENCOUNTER — Encounter: Payer: Self-pay | Admitting: Allergy and Immunology

## 2016-02-26 VITALS — BP 110/70 | HR 88 | Resp 18

## 2016-02-26 DIAGNOSIS — J3089 Other allergic rhinitis: Secondary | ICD-10-CM | POA: Diagnosis not present

## 2016-02-26 DIAGNOSIS — J4540 Moderate persistent asthma, uncomplicated: Secondary | ICD-10-CM | POA: Diagnosis not present

## 2016-02-26 DIAGNOSIS — R519 Headache, unspecified: Secondary | ICD-10-CM

## 2016-02-26 DIAGNOSIS — R51 Headache: Secondary | ICD-10-CM

## 2016-02-26 NOTE — Progress Notes (Signed)
Follow-up Note  Referring Provider: Midge Minium, MD Primary Provider: Annye Asa, MD Date of Office Visit: 02/26/2016  Subjective:   Tina Williams (DOB: [birth year]) is a 55 y.o. female who returns to the [facility] on 02/26/2016 in re-evaluation of the following:  HPI: Cagney returns to this clinic in reevaluation of her asthma and allergic rhinitis and history of recurrent infections. I last saw her in his clinic in May 2017. She's continued to use immunotherapy as well as medications directed against respiratory tract inflammation on a consistent basis.  Lynasia informs me that she has had a "bad spring". Apparently she's had problems with intermittent postnasal drip and wheezing and itchy red watery eyes and sinus pressure especially with outdoor exposure. She had to discontinue her Flonase because it gave rise to much drying of her airway. She apparently received a systemic steroid for a muscle pull in her right groin at the end of July and this may have helped her nasal congestion somewhat but she still continue to have rather significant problems with her head pain. She has right-sided maxillary pain and an overall congestive feeling of her head. She did see Dr. Birdie Riddle who gave her some doxycycline yet this has not really helped her to any large degree. It should be noted that she has required to antibiotics over the course of the past 6 months to treat "sinusitis".  Presently her asthma is under excellent control and she no longer needs to use any short acting bronchodilator. Her immunotherapy is going well without any adverse event. She is not quite up to maintenance dosing yet.    Medication List      albuterol 108 (90 Base) MCG/ACT inhaler Commonly known as:  PROVENTIL HFA;VENTOLIN HFA Inhale 2 puffs into the lungs every 4 (four) hours as needed for wheezing or shortness of breath. Reported on 12/18/2015   ASMANEX HFA 200 MCG/ACT Aero Generic drug:   Mometasone Furoate USE 2 PUFFS 2 TIMES A DAY WITH SPACER...RINSE, GARGLE, & SPIT AFTER USE   B-12 1000 MCG Caps Take by mouth daily.   cetirizine 10 MG tablet Commonly known as:  ZYRTEC TAKE ONE TABLET ONCE DAILY IF NEEDED   doxycycline 100 MG tablet Commonly known as:  VIBRA-TABS Take 1 tablet (100 mg total) by mouth 2 (two) times daily.   DULoxetine 60 MG capsule Commonly known as:  CYMBALTA Take 1 capsule (60 mg total) by mouth daily.   DULoxetine 30 MG capsule Commonly known as:  CYMBALTA TAKE 1 CAPSULE (30 MG TOTAL) BY MOUTH DAILY IN ADDITION TO 60MG  FOR TOTAL OF 90MG    fluticasone 50 MCG/ACT nasal spray Commonly known as:  FLONASE USE ONE SPRAY IN EACH NOSTRIL TWICE DAILY   ibuprofen 800 MG tablet Commonly known as:  ADVIL,MOTRIN Twice daily- take w/ food   loratadine 10 MG tablet Commonly known as:  CLARITIN Take 10 mg by mouth daily as needed for allergies. Reported on 12/18/2015   MULTIVITAMIN PO Take 1 tablet by mouth daily. Reported on 12/18/2015   NON FORMULARY daily. Herbal supplement: Bowel Stimulant   omeprazole 40 MG capsule Commonly known as:  PRILOSEC TAKE 1 CAPSULE (40 MG TOTAL) BY MOUTH DAILY.   Vitamin D3 5000 UNIT/ML Liqd Take by mouth daily.       Past Medical History:  Diagnosis Date  . Allergy   . Asthma   . Depression   . Early menopause   . Hx of adenomatous polyp of colon  01/11/2016  . Hyperlipidemia   . Non-allergic rhinitis   . Obesity   . Osteoarthritis   . Personal history of colonic adenoma  11/26/2009  . Sinusitis acute   . URI (upper respiratory infection)     Past Surgical History:  Procedure Laterality Date  . CESAREAN SECTION  1988  . KNEE SURGERY Right 1998  . WISDOM TOOTH EXTRACTION  1984    Allergies  Allergen Reactions  . Amoxicillin Hives  . Clarithromycin     REACTION: hives  . Naproxen Sodium     REACTION: HIVES  . Penicillins     REACTION: hives  . Sulfa Antibiotics Hives    Review of systems  negative except as noted in HPI / PMHx or noted below:  Review of Systems  Constitutional: Negative.   HENT: Negative.   Eyes: Negative.   Respiratory: Negative.   Cardiovascular: Negative.   Gastrointestinal: Negative.   Genitourinary: Negative.   Musculoskeletal: Negative.   Skin: Negative.   Neurological: Negative.   Endo/Heme/Allergies: Negative.   Psychiatric/Behavioral: Negative.      Objective:   Vitals:   02/26/16 1011  BP: 110/70  Pulse: 88  Resp: 18          Physical Exam  Constitutional: She is well-developed, well-nourished, and in no distress.  HENT:  Head: Normocephalic.  Right Ear: Tympanic membrane, external ear and ear canal normal.  Left Ear: Tympanic membrane, external ear and ear canal normal.  Nose: Nose normal. No mucosal edema or rhinorrhea.  Mouth/Throat: Uvula is midline, oropharynx is clear and moist and mucous membranes are normal. No oropharyngeal exudate.  Eyes: Conjunctivae are normal.  Neck: Trachea normal. No tracheal tenderness present. No tracheal deviation present. No thyromegaly present.  Cardiovascular: Normal rate, regular rhythm, S1 normal, S2 normal and normal heart sounds.   No murmur heard. Pulmonary/Chest: Breath sounds normal. No stridor. No respiratory distress. She has no wheezes. She has no rales.  Musculoskeletal: She exhibits no edema.  Lymphadenopathy:       Head (right side): No tonsillar adenopathy present.       Head (left side): No tonsillar adenopathy present.    She has no cervical adenopathy.  Neurological: She is alert. Gait normal.  Skin: No rash noted. She is not diaphoretic. No erythema. Nails show no clubbing.  Psychiatric: Mood and affect normal.    Diagnostics:    Spirometry was performed and demonstrated an FEV1 of 2.05 at 85 % of predicted.   Assessment and Plan:   1. Other allergic rhinitis   2. Asthma, moderate persistent, well-controlled   3. Headache disorder     1. Continue  Immunotherapy and Epi-Pen  2. Use OTC Rhinocort 1 spray each nostril 3-7 times per week   3. Continue Asmanex 200 HFA two inhalations two times per day.   4. Continue claritin / zyrtec / allegra , nasal saline if needed  5. Continue ProairHFA if needed  6. Obtain limited sinus CT scan for chronic sinusitis  7. Return in 6 months or earlier if problem  I think the best way to work through the issue with Shikira's "sinusitis" is to document the presence of infected or clear airways by obtaining a limited sinus CT scan and we will get that performed as soon as possible. This will provide [place] a road map about which way to go regarding further evaluation and treatment of her symptoms. She very well could be having a headache/facial pain syndrome more so than true inflammation  and infection of her upper airway mucosa. Her asthma appears to be doing well and does not require any additional manipulation at this point. She'll continue on immunotherapy.  Allena Katz, MD [facility]

## 2016-02-26 NOTE — Patient Instructions (Addendum)
  1. Continue Immunotherapy and Epi-Pen  2. Use OTC Rhinocort 1 spray each nostril 3-7 times per week   3. Continue Asmanex 200 HFA two inhalations two times per day.   4. Continue claritin / zyrtec / allegra , nasal saline if needed  5. Continue ProairHFA if needed  6. Obtain limited sinus CT scan for chronic sinusitis  7. Return in 6 months or earlier if problem

## 2016-03-01 ENCOUNTER — Other Ambulatory Visit: Payer: Self-pay | Admitting: Allergy and Immunology

## 2016-03-01 ENCOUNTER — Telehealth: Payer: Self-pay

## 2016-03-01 NOTE — Telephone Encounter (Signed)
Pt contacted regarding pending appoint at [facility] for Limited Sinus CT scan. Scheduled 03-04-16 @ 8 am. Order faxed to [facility].

## 2016-03-04 ENCOUNTER — Ambulatory Visit (INDEPENDENT_AMBULATORY_CARE_PROVIDER_SITE_OTHER): Payer: BLUE CROSS/BLUE SHIELD | Admitting: *Deleted

## 2016-03-04 DIAGNOSIS — J309 Allergic rhinitis, unspecified: Secondary | ICD-10-CM

## 2016-03-08 ENCOUNTER — Telehealth: Payer: Self-pay

## 2016-03-08 NOTE — Telephone Encounter (Signed)
Left message for pt to call. Per Dr. Neldon Mc... Sinusitis was not identified on CT scan and is not the cause of her symptoms. She should slowly taper off of all caffeine and chocolate over the next 3 weeks. May need to start Periactin. Will need an update from her after discontinuing caffeine and chocolate.

## 2016-03-10 NOTE — Telephone Encounter (Signed)
Pt advised.

## 2016-03-11 ENCOUNTER — Ambulatory Visit (INDEPENDENT_AMBULATORY_CARE_PROVIDER_SITE_OTHER): Payer: BLUE CROSS/BLUE SHIELD | Admitting: *Deleted

## 2016-03-11 DIAGNOSIS — J309 Allergic rhinitis, unspecified: Secondary | ICD-10-CM

## 2016-03-15 ENCOUNTER — Encounter: Payer: Self-pay | Admitting: Allergy and Immunology

## 2016-03-17 ENCOUNTER — Ambulatory Visit (INDEPENDENT_AMBULATORY_CARE_PROVIDER_SITE_OTHER): Payer: BLUE CROSS/BLUE SHIELD | Admitting: *Deleted

## 2016-03-17 DIAGNOSIS — J309 Allergic rhinitis, unspecified: Secondary | ICD-10-CM | POA: Diagnosis not present

## 2016-03-24 ENCOUNTER — Ambulatory Visit (INDEPENDENT_AMBULATORY_CARE_PROVIDER_SITE_OTHER): Payer: BLUE CROSS/BLUE SHIELD | Admitting: *Deleted

## 2016-03-24 DIAGNOSIS — J309 Allergic rhinitis, unspecified: Secondary | ICD-10-CM

## 2016-03-31 ENCOUNTER — Ambulatory Visit (INDEPENDENT_AMBULATORY_CARE_PROVIDER_SITE_OTHER): Payer: BLUE CROSS/BLUE SHIELD | Admitting: *Deleted

## 2016-03-31 DIAGNOSIS — J309 Allergic rhinitis, unspecified: Secondary | ICD-10-CM | POA: Diagnosis not present

## 2016-04-05 ENCOUNTER — Ambulatory Visit (INDEPENDENT_AMBULATORY_CARE_PROVIDER_SITE_OTHER): Payer: BLUE CROSS/BLUE SHIELD | Admitting: Allergy

## 2016-04-05 ENCOUNTER — Encounter: Payer: Self-pay | Admitting: Allergy

## 2016-04-05 VITALS — BP 130/90 | HR 88 | Resp 20

## 2016-04-05 DIAGNOSIS — J4540 Moderate persistent asthma, uncomplicated: Secondary | ICD-10-CM | POA: Diagnosis not present

## 2016-04-05 DIAGNOSIS — J0100 Acute maxillary sinusitis, unspecified: Secondary | ICD-10-CM

## 2016-04-05 DIAGNOSIS — J3089 Other allergic rhinitis: Secondary | ICD-10-CM | POA: Diagnosis not present

## 2016-04-05 MED ORDER — AZITHROMYCIN 500 MG PO TABS: ORAL_TABLET | ORAL | 0 refills | Status: DC

## 2016-04-05 NOTE — Patient Instructions (Signed)
Sinusitis    - take Azithromycin 500mg  x 3 days    - start back doing your nasal saline rinses daily    - use Flonase 2 spray each nostril daily after nasal saline rinse  Asthma    - at this time not exacerbated    - continue Asmanex 200 HFA 2 inhalations twice a day    - continue albuterol 2 puffs every 4-6 hours as needed for cough, wheeze, shortness of breath    - let us know if you are needing frequent use of albuterol  Allergic rhinitis    - continue zyrtec 10mg  daily    - continue nasal spray and rinses as above   Keep regularly scheduled follow-up with Dr. Neldon Mc

## 2016-04-05 NOTE — Progress Notes (Signed)
Follow-up Note  RE: Tina Williams MRN: [id number] DOB: Apr 29, 1961 Date of Office Visit: 04/05/2016   History of present illness: Tina Williams is a 55 y.o. female presenting today for sick visit.   She was last seen in our office on September 1 by Dr. Neldon Mc.   She has been followed for asthma, allergic rhinitis. This visit she discussed her issues with her sinuses that included intermittent postnasal drip, wheezing, itchy red watery eyes and sinus pressure.   She had a sinus CT scan done in September that was unremarkable for evidence of sinusitis.     She returns today as she feels she has a sinus infection.  After her last visit she reports that she did have some relief of her sinus pain patient and facial pain where she was doing well for several weeks.   However her husband came down with a cold about 2 weeks or so ago and she developed similar symptoms about a week ago.   She reports she started  with a lot of  Post-nasal drainage.   She then redeveloped sinus pain and pressure in her right cheeks that extends into her ear as well as her forehead. She started with a cough yesterday and  Occasional wheezing. She has been using her albuterol only 3 times in the past week.   She continues on her Asmanex 2 puffs twice a day.   She has not been using any nasal sprays  as she states she looked into her nose with a flashlight and noted that it was red and felt that the nasal spray was causing the redness.  No fevers.       Review of systems: Review of Systems  Constitutional: Negative for chills and fever.  HENT: Positive for congestion and ear pain. Negative for sore throat.   Eyes: Negative for redness.  Respiratory: Positive for cough and wheezing. Negative for sputum production and shortness of breath.   Cardiovascular: Negative for chest pain.  Gastrointestinal: Negative for nausea and vomiting.  Skin: Negative for itching and rash.  Neurological: Positive for headaches.    All other  systems negative unless noted above in HPI  Past medical/social/surgical/family history have been reviewed and are unchanged unless specifically indicated below.  No changes  Medication List:   Medication List       Accurate as of 04/05/16 12:16 PM. Always use your most recent med list.          albuterol 108 (90 Base) MCG/ACT inhaler Commonly known as:  PROVENTIL HFA;VENTOLIN HFA Inhale 2 puffs into the lungs every 4 (four) hours as needed for wheezing or shortness of breath. Reported on 12/18/2015   ASMANEX HFA 200 MCG/ACT Aero Generic drug:  Mometasone Furoate USE 2 PUFFS 2 TIMES A DAY WITH SPACER...RINSE, GARGLE, & SPIT AFTER USE   azithromycin 500 MG tablet Commonly known as:  ZITHROMAX Take one tablet once daily for 3 days   cetirizine 10 MG tablet Commonly known as:  ZYRTEC TAKE ONE TABLET ONCE DAILY IF NEEDED   DULoxetine 60 MG capsule Commonly known as:  CYMBALTA Take 1 capsule (60 mg total) by mouth daily.   DULoxetine 30 MG capsule Commonly known as:  CYMBALTA TAKE 1 CAPSULE (30 MG TOTAL) BY MOUTH DAILY IN ADDITION TO 60MG  FOR TOTAL OF 90MG    fluticasone 50 MCG/ACT nasal spray Commonly known as:  FLONASE USE ONE SPRAY IN EACH NOSTRIL TWICE DAILY   ibuprofen 800 MG tablet Commonly known as:  ADVIL,MOTRIN Twice daily- take w/ food   NON FORMULARY daily. Herbal supplement: Bowel Stimulant   omeprazole 40 MG capsule Commonly known as:  PRILOSEC TAKE 1 CAPSULE (40 MG TOTAL) BY MOUTH DAILY.   ranitidine 150 MG tablet Commonly known as:  ZANTAC Take 150 mg by mouth. Take on the day you receive allergy injections   Vitamin D3 5000 UNIT/ML Liqd Take by mouth daily.       Known medication allergies: Allergies  Allergen Reactions  . Amoxicillin Hives  . Clarithromycin     REACTION: hives  . Naproxen Sodium     REACTION: HIVES  . Penicillins     REACTION: hives  . Sulfa Antibiotics Hives     Physical examination: Blood pressure 130/90,  pulse 88, resp. rate 20.  General: Alert, interactive, in no acute distress. HEENT: TMs pearly gray, turbinates moderately edematous with thick discharge, post-pharynx mildly erythematous.  Significant TTP over right maxillary sinus.   Neck: Supple without lymphadenopathy. Lungs: Clear to auscultation without wheezing, rhonchi or rales. {no increased work of breathing. CV: Normal S1, S2 without murmurs. Abdomen: Nondistended, nontender. Skin: Warm and dry, without lesions or rashes. Extremities:  No clubbing, cyanosis or edema. Neuro:   Grossly intact.  Diagnositics/Labs:  Spirometry: FEV1: 2.56L 83%, FVC: 2.00L  83%, ratio consistent with Nonobstructive pattern, similar to previous study  Assessment and plan:   Sinusitis, acute    -  She has a history of chronic sinus and facial pain with a recent unremarkable sinus CT scan.   She reports having resolution of her sinus and facial for a period of time however she has had a sick contact and developed likely initial viral illness.   She has had symptoms for more than a week now that are not improving.  She had significant tenderness to lite palpation over her right maxillary sinus.   Given the symptoms inclined to treat with azithromycin. Discussed her clarithromycin listed allergy and she reports she has not had any reactions with azithromycin use in the past.    - take Azithromycin 500mg  x 3 days    - start back doing your nasal saline rinses daily    - use Flonase 2 spray each nostril daily after nasal saline rinse  Asthma, Moderate persistent    -  She does not have a current exacerbation at this point    - continue Asmanex 200 HFA 2 inhalations twice a day    - continue albuterol 2 puffs every 4-6 hours as needed for cough, wheeze, shortness of breath    - let us know if you are needing frequent use of albuterol  Allergic rhinitis    - continue zyrtec 10mg  daily    - continue nasal spray and rinses as above  Keep regularly  scheduled follow-up with Dr. Neldon Mc   I appreciate the opportunity to take part in Ipek's care. Please do not hesitate to contact me with questions.  Sincerely,   Prudy Feeler, MD Allergy/Immunology [facility]

## 2016-04-14 ENCOUNTER — Ambulatory Visit (INDEPENDENT_AMBULATORY_CARE_PROVIDER_SITE_OTHER): Payer: BLUE CROSS/BLUE SHIELD | Admitting: *Deleted

## 2016-04-14 DIAGNOSIS — J309 Allergic rhinitis, unspecified: Secondary | ICD-10-CM | POA: Diagnosis not present

## 2016-04-20 ENCOUNTER — Other Ambulatory Visit: Payer: Self-pay | Admitting: Allergy and Immunology

## 2016-04-21 ENCOUNTER — Ambulatory Visit (INDEPENDENT_AMBULATORY_CARE_PROVIDER_SITE_OTHER): Payer: BLUE CROSS/BLUE SHIELD | Admitting: *Deleted

## 2016-04-21 DIAGNOSIS — J309 Allergic rhinitis, unspecified: Secondary | ICD-10-CM | POA: Diagnosis not present

## 2016-04-28 ENCOUNTER — Ambulatory Visit (INDEPENDENT_AMBULATORY_CARE_PROVIDER_SITE_OTHER): Payer: BLUE CROSS/BLUE SHIELD | Admitting: *Deleted

## 2016-04-28 DIAGNOSIS — J309 Allergic rhinitis, unspecified: Secondary | ICD-10-CM | POA: Diagnosis not present

## 2016-05-06 ENCOUNTER — Ambulatory Visit (INDEPENDENT_AMBULATORY_CARE_PROVIDER_SITE_OTHER): Payer: BLUE CROSS/BLUE SHIELD | Admitting: *Deleted

## 2016-05-06 DIAGNOSIS — J309 Allergic rhinitis, unspecified: Secondary | ICD-10-CM

## 2016-05-10 ENCOUNTER — Other Ambulatory Visit: Payer: Self-pay | Admitting: Family Medicine

## 2016-05-16 ENCOUNTER — Ambulatory Visit (INDEPENDENT_AMBULATORY_CARE_PROVIDER_SITE_OTHER): Payer: BLUE CROSS/BLUE SHIELD | Admitting: *Deleted

## 2016-05-16 DIAGNOSIS — J309 Allergic rhinitis, unspecified: Secondary | ICD-10-CM

## 2016-05-27 ENCOUNTER — Ambulatory Visit (INDEPENDENT_AMBULATORY_CARE_PROVIDER_SITE_OTHER): Payer: BLUE CROSS/BLUE SHIELD | Admitting: *Deleted

## 2016-05-27 DIAGNOSIS — J309 Allergic rhinitis, unspecified: Secondary | ICD-10-CM

## 2016-06-06 ENCOUNTER — Other Ambulatory Visit: Payer: Self-pay | Admitting: Allergy and Immunology

## 2016-06-07 DIAGNOSIS — J302 Other seasonal allergic rhinitis: Secondary | ICD-10-CM | POA: Diagnosis not present

## 2016-06-08 DIAGNOSIS — J301 Allergic rhinitis due to pollen: Secondary | ICD-10-CM | POA: Diagnosis not present

## 2016-06-21 ENCOUNTER — Ambulatory Visit (INDEPENDENT_AMBULATORY_CARE_PROVIDER_SITE_OTHER): Payer: BLUE CROSS/BLUE SHIELD | Admitting: Medical

## 2016-06-21 ENCOUNTER — Encounter: Payer: Self-pay | Admitting: Medical

## 2016-06-21 VITALS — BP 112/63 | HR 102 | Temp 98.1°F | Wt 171.4 lb

## 2016-06-21 DIAGNOSIS — J0100 Acute maxillary sinusitis, unspecified: Secondary | ICD-10-CM

## 2016-06-21 MED ORDER — BENZONATATE 100 MG PO CAPS: 100.0000 mg | ORAL_CAPSULE | Freq: Three times a day (TID) | ORAL | 0 refills | Status: DC | PRN

## 2016-06-21 MED ORDER — PREDNISONE 10 MG PO TABS: ORAL_TABLET | ORAL | 0 refills | Status: DC

## 2016-06-21 MED ORDER — DOXYCYCLINE HYCLATE 100 MG PO TABS: 100.0000 mg | ORAL_TABLET | Freq: Two times a day (BID) | ORAL | 0 refills | Status: DC

## 2016-06-21 NOTE — Progress Notes (Signed)
Subjective:    Patient ID: Tina Williams, female    DOB: 03-Sep-1960, 55 y.o.   MRN: [id number]  HPI  4 days of cough, sinus pressure nasal congestion and cough. When blows nose get colored mucous. Hx of allergies year round.  Get sinus infection easily.  Pt not a smoker.   Review of Systems  Constitutional: Negative for chills, fatigue and fever.  HENT: Positive for congestion, sinus pain and sinus pressure. Negative for sore throat, tinnitus and voice change.        Pt states most of symptoms started on 24th but one week of preceding mild cold.  Respiratory: Positive for cough and wheezing. Negative for shortness of breath.        Wheezing some this am. But better now.  Pt has albuterol and she can get her asmanex today.  Cardiovascular: Negative for chest pain and palpitations.  Gastrointestinal: Negative for anal bleeding.  Skin: Negative for rash.  Hematological: Negative for adenopathy. Does not bruise/bleed easily.  Psychiatric/Behavioral: Negative for behavioral problems.   Past Medical History:  Diagnosis Date  . Allergy   . Asthma   . Depression   . Early menopause   . Hx of adenomatous polyp of colon 01/11/2016  . Hyperlipidemia   . Non-allergic rhinitis   . Obesity   . Osteoarthritis   . Personal history of colonic adenoma  11/26/2009  . Sinusitis acute   . URI (upper respiratory infection)      Social History   Social History  . Marital status: Married    Spouse name: N/A  . Number of children: 3  . Years of education: N/A   Occupational History  . Teacher's Aid    Social History Main Topics  . Smoking status: Former Smoker    Packs/day: 0.50    Years: 18.00    Types: Cigarettes    Quit date: 06/28/1999  . Smokeless tobacco: Never Used  . Alcohol use 1.8 oz/week    3 Glasses of wine per week  . Drug use: No  . Sexual activity: Not on file   Other Topics Concern  . Not on file   Social History Narrative  . No narrative on file    Past  Surgical History:  Procedure Laterality Date  . CESAREAN SECTION  1988  . KNEE SURGERY Right 1998  . WISDOM TOOTH EXTRACTION  1984    Family History  Problem Relation Age of Onset  . Colon cancer Sister 53  . Hypertension Mother   . Diabetes Mother   . Diabetes Brother   . Diabetes Sister   . Lung cancer Sister     was a smoker  . Emphysema Father     was a smoker    Allergies  Allergen Reactions  . Amoxicillin Hives  . Clarithromycin     REACTION: hives  . Naproxen Sodium     REACTION: HIVES  . Penicillins     REACTION: hives  . Sulfa Antibiotics Hives    Current Outpatient Prescriptions on File Prior to Visit  Medication Sig Dispense Refill  . albuterol (PROVENTIL HFA;VENTOLIN HFA) 108 (90 Base) MCG/ACT inhaler Inhale 2 puffs into the lungs every 4 (four) hours as needed for wheezing or shortness of breath. Reported on 12/18/2015    . ASMANEX HFA 200 MCG/ACT AERO USE 2 PUFFS 2 TIMES A DAY WITH SPACER...RINSE, GARGLE, & SPIT AFTER USE 13 Inhaler 5  . cetirizine (ZYRTEC) 10 MG tablet TAKE ONE TABLET ONCE DAILY  IF NEEDED 30 tablet 3  . Cholecalciferol (VITAMIN D3) 5000 UNIT/ML LIQD Take by mouth daily.    . DULoxetine (CYMBALTA) 30 MG capsule TAKE 1 CAPSULE (30 MG TOTAL) BY MOUTH DAILY IN ADDITION TO 60MG  FOR TOTAL OF 90MG  30 capsule 6  . DULoxetine (CYMBALTA) 60 MG capsule TAKE 1 CAPSULE (60 MG TOTAL) BY MOUTH DAILY. 30 capsule 6  . EPIPEN 2-PAK 0.3 MG/0.3ML SOAJ injection USE AS DIRECTED FOR SEVERE LIFE-THREATENING ALLERGIC REACTION. 2 Device 0  . fluticasone (FLONASE) 50 MCG/ACT nasal spray USE ONE SPRAY IN EACH NOSTRIL TWICE DAILY 16 g 5  . ibuprofen (ADVIL,MOTRIN) 800 MG tablet Twice daily- take w/ food 30 tablet 0  . NON FORMULARY daily. Herbal supplement: Bowel Stimulant    . omeprazole (PRILOSEC) 40 MG capsule TAKE 1 CAPSULE (40 MG TOTAL) BY MOUTH DAILY. 30 capsule 6  . ranitidine (ZANTAC) 150 MG tablet Take 150 mg by mouth. Take on the day you receive allergy  injections    . azithromycin (ZITHROMAX) 500 MG tablet Take one tablet once daily for 3 days (Patient not taking: Reported on 06/21/2016) 3 tablet 0   No current facility-administered medications on file prior to visit.     BP 112/63 (BP Location: Left Arm, Patient Position: Sitting, Cuff Size: Normal)   Pulse (!) 102   Temp 98.1 F (36.7 C) (Oral)   Wt 171 lb 6.4 oz (77.7 kg)   SpO2 99% Comment: RA  BMI 32.39 kg/m       Objective:   Physical Exam  General  Mental Status - Alert. General Appearance - Well groomed. Not in acute distress.  Skin Rashes- No Rashes.  HEENT Head- Normal. Ear Auditory Canal - Left- Normal. Right - Normal.Tympanic Membrane- Left- Normal. Right- Normal. Eye Sclera/Conjunctiva- Left- Normal. Right- Normal. Nose & Sinuses Nasal Mucosa- Left-  Boggy and Congested. Right-  Boggy and  Congested.rt maxillary and  Rt frontal sinus pressure. Mouth & Throat Lips: Upper Lip- Normal: no dryness, cracking, pallor, cyanosis, or vesicular eruption. Lower Lip-Normal: no dryness, cracking, pallor, cyanosis or vesicular eruption. Buccal Mucosa- Bilateral- No Aphthous ulcers. Oropharynx- No Discharge or Erythema. Tonsils: Characteristics- Bilateral- No Erythema or Congestion. Size/Enlargement- Bilateral- No enlargement. Discharge- bilateral-None.  Neck Neck- Supple. No Masses.   Chest and Lung Exam Auscultation: Breath Sounds:-Clear even and unlabored.  Cardiovascular Auscultation:Rythm- Regular, rate and rhythm. Murmurs & Other Heart Sounds:Ausculatation of the heart reveal- No Murmurs.  Lymphatic Head & Neck General Head & Neck Lymphatics: Bilateral: Description- No Localized lymphadenopathy.       Assessment & Plan:  You appear to have a sinus infection. I am prescribing  doxycycline antibiotic for the infection. For nasal steroid use your flonase. For your associated cough, I prescribed cough medicine benzonatate.  For you wheezing today use  your asmanex and albuterol. Rx prednisone taper as back up if your wheezing worsens.  Rest, hydrate, tylenol for fever.  Follow up in 7 days or as needed.

## 2016-06-21 NOTE — Progress Notes (Signed)
Pre visit review using our clinic review tool, if applicable. No additional management support is needed unless otherwise documented below in the visit note. 

## 2016-06-21 NOTE — Patient Instructions (Addendum)
You appear to have a sinus infection. I am prescribing  doxycycline antibiotic for the infection. For nasal steroid use your flonase. For your associated cough, I prescribed cough medicine benzonatate.  For you wheezing today use your asmanex and albuterol. Rx prednisone taper as back up if your wheezing worsens.  Rest, hydrate, tylenol for fever.  Follow up in 7 days or as needed.

## 2016-06-24 ENCOUNTER — Ambulatory Visit (INDEPENDENT_AMBULATORY_CARE_PROVIDER_SITE_OTHER): Payer: BLUE CROSS/BLUE SHIELD | Admitting: *Deleted

## 2016-06-24 DIAGNOSIS — J309 Allergic rhinitis, unspecified: Secondary | ICD-10-CM | POA: Diagnosis not present

## 2016-07-01 ENCOUNTER — Ambulatory Visit (INDEPENDENT_AMBULATORY_CARE_PROVIDER_SITE_OTHER): Payer: BLUE CROSS/BLUE SHIELD | Admitting: *Deleted

## 2016-07-01 DIAGNOSIS — J309 Allergic rhinitis, unspecified: Secondary | ICD-10-CM | POA: Diagnosis not present

## 2016-07-04 ENCOUNTER — Other Ambulatory Visit: Payer: Self-pay | Admitting: Family Medicine

## 2016-07-08 ENCOUNTER — Ambulatory Visit (INDEPENDENT_AMBULATORY_CARE_PROVIDER_SITE_OTHER): Payer: BLUE CROSS/BLUE SHIELD | Admitting: *Deleted

## 2016-07-08 DIAGNOSIS — J309 Allergic rhinitis, unspecified: Secondary | ICD-10-CM

## 2016-07-18 ENCOUNTER — Other Ambulatory Visit: Payer: Self-pay | Admitting: *Deleted

## 2016-07-18 ENCOUNTER — Ambulatory Visit (INDEPENDENT_AMBULATORY_CARE_PROVIDER_SITE_OTHER): Payer: BLUE CROSS/BLUE SHIELD | Admitting: *Deleted

## 2016-07-18 DIAGNOSIS — J309 Allergic rhinitis, unspecified: Secondary | ICD-10-CM | POA: Diagnosis not present

## 2016-07-18 MED ORDER — MOMETASONE FUROATE 200 MCG/ACT IN AERO: 2.0000 | INHALATION_SPRAY | Freq: Two times a day (BID) | RESPIRATORY_TRACT | 0 refills | Status: DC

## 2016-07-29 ENCOUNTER — Ambulatory Visit (INDEPENDENT_AMBULATORY_CARE_PROVIDER_SITE_OTHER): Payer: BLUE CROSS/BLUE SHIELD | Admitting: *Deleted

## 2016-07-29 DIAGNOSIS — J309 Allergic rhinitis, unspecified: Secondary | ICD-10-CM

## 2016-08-03 ENCOUNTER — Other Ambulatory Visit: Payer: Self-pay | Admitting: Family Medicine

## 2016-08-05 ENCOUNTER — Telehealth: Payer: Self-pay | Admitting: Family Medicine

## 2016-08-05 ENCOUNTER — Ambulatory Visit (INDEPENDENT_AMBULATORY_CARE_PROVIDER_SITE_OTHER): Payer: BLUE CROSS/BLUE SHIELD | Admitting: *Deleted

## 2016-08-05 DIAGNOSIS — J309 Allergic rhinitis, unspecified: Secondary | ICD-10-CM

## 2016-08-05 MED ORDER — DULOXETINE HCL 60 MG PO CPEP: 60.0000 mg | ORAL_CAPSULE | Freq: Every day | ORAL | 1 refills | Status: DC

## 2016-08-05 MED ORDER — OMEPRAZOLE 40 MG PO CPDR: DELAYED_RELEASE_CAPSULE | ORAL | 1 refills | Status: DC

## 2016-08-05 NOTE — Telephone Encounter (Signed)
Noted, first refill request form pt came from local pharmacy. meds filled to mail order.

## 2016-08-05 NOTE — Telephone Encounter (Signed)
Patient states the omeprazole refill from 02/07 is incorrect. [facility]. She also states that she needs DULoxetine (CYMBALTA) 60 MG capsule [id number]  [facility]. She is still good on the 30's.

## 2016-08-09 NOTE — Addendum Note (Signed)
Addended by: Felipa Emory on: 08/09/2016 09:56 AM   Modules accepted: Orders

## 2016-08-12 ENCOUNTER — Ambulatory Visit (INDEPENDENT_AMBULATORY_CARE_PROVIDER_SITE_OTHER): Payer: BLUE CROSS/BLUE SHIELD | Admitting: *Deleted

## 2016-08-12 DIAGNOSIS — J309 Allergic rhinitis, unspecified: Secondary | ICD-10-CM | POA: Diagnosis not present

## 2016-08-15 ENCOUNTER — Telehealth: Payer: Self-pay | Admitting: Family Medicine

## 2016-08-15 ENCOUNTER — Encounter: Payer: Self-pay | Admitting: Allergy and Immunology

## 2016-08-15 ENCOUNTER — Ambulatory Visit: Payer: Self-pay | Admitting: *Deleted

## 2016-08-15 ENCOUNTER — Ambulatory Visit (INDEPENDENT_AMBULATORY_CARE_PROVIDER_SITE_OTHER): Payer: BLUE CROSS/BLUE SHIELD | Admitting: Allergy and Immunology

## 2016-08-15 VITALS — BP 110/70 | HR 80 | Resp 14

## 2016-08-15 DIAGNOSIS — J4540 Moderate persistent asthma, uncomplicated: Secondary | ICD-10-CM

## 2016-08-15 DIAGNOSIS — J3089 Other allergic rhinitis: Secondary | ICD-10-CM | POA: Diagnosis not present

## 2016-08-15 DIAGNOSIS — J309 Allergic rhinitis, unspecified: Secondary | ICD-10-CM

## 2016-08-15 DIAGNOSIS — R51 Headache: Secondary | ICD-10-CM

## 2016-08-15 DIAGNOSIS — R519 Headache, unspecified: Secondary | ICD-10-CM

## 2016-08-15 MED ORDER — FLUTICASONE PROPIONATE 50 MCG/ACT NA SUSP: NASAL | 1 refills | Status: DC

## 2016-08-15 MED ORDER — MONTELUKAST SODIUM 10 MG PO TABS: ORAL_TABLET | ORAL | 1 refills | Status: DC

## 2016-08-15 MED ORDER — CETIRIZINE HCL 10 MG PO TABS: ORAL_TABLET | ORAL | 1 refills | Status: DC

## 2016-08-15 NOTE — Telephone Encounter (Signed)
Patient needs refill on rx DULoxetine (CYMBALTA) 60 MG capsule but the 30mg .  Please call patient to advise at [facility], [place] [phone] (Phone) [phone] (Fax)

## 2016-08-15 NOTE — Telephone Encounter (Signed)
Please advise what medication this pt needs. The cymbalta 60mg  [facility] on 08[facility]05/16.

## 2016-08-15 NOTE — Patient Instructions (Addendum)
  1. Continue Immunotherapy and Epi-Pen  2. Continue Flonase 1-2 sprays each nostril 3-7 times per week   3. Continue Asmanex 200 HFA two inhalations two times per day.   4. Start Montelukast 10mg  one tablet one time per day  5. Continue claritin / zyrtec / allegra , nasal saline if needed  6. Continue ProairHFA if needed  7. Obtain a flu vaccine  8. Return in 6 months or earlier if problem

## 2016-08-15 NOTE — Progress Notes (Signed)
Follow-up Note  Referring Provider: Midge Minium, MD Primary Provider: Annye Asa, MD Date of Office Visit: 08/15/2016  Subjective:   Tina Williams (DOB: 1960/09/20) is a 56 y.o. female who returns to the [facility] on 08/15/2016 in re-evaluation of the following:  HPI: Tina Williams returns to this clinic in reevaluation of her asthma and allergic rhinoconjunctivitis and history of recurrent sinus infections and headaches. I've not seen her in this clinic since October 2017.  Her asthma has been under very good control unless she goes outdoors. She has noticed that over the course of the past few weeks if she goes outdoors she does get a problem occasionally with a little bit of chest tightness for which she uses a short acting bronchodilator which results in resolution of this issue. She has not required a systemic steroids since I've seen her in his clinic. She can exercise without any difficulty.  Her nose has been doing okay but once again she's developed some issues with nasal congestion over the course the past several weeks with outdoor exposure. She apparently did require an antibiotic in October and an antibiotic in December for an episode of "sinusitis". When I last saw her in this clinic she was having recurrent "sinusitis" and I obtained a sinus CT scan which did not identify any sinusitis. I instructed her to taper off her caffeine. She has since become decaffeinated other than a small cup of coffee in the morning and most of her headaches have resolved. She's only had 1 significant headache since I've last seen her in his clinic since she stopped all of her caffeine consumption.  Her immunotherapy is going quite well. Currently she is using this form of therapy every week. She does have some large local reactions but has been able to escalate her dose.  She has not received the flu vaccine yet.  Allergies as of 08/15/2016      Reactions   Amoxicillin  Hives   Clarithromycin    REACTION: hives   Naproxen Sodium    REACTION: HIVES   Penicillins    REACTION: hives   Sulfa Antibiotics Hives      Medication List      albuterol 108 (90 Base) MCG/ACT inhaler Commonly known as:  PROVENTIL HFA;VENTOLIN HFA Inhale 2 puffs into the lungs every 4 (four) hours as needed for wheezing or shortness of breath. Reported on 12/18/2015   cetirizine 10 MG tablet Commonly known as:  ZYRTEC TAKE ONE TABLET ONCE DAILY IF NEEDED   DULoxetine 60 MG capsule Commonly known as:  CYMBALTA Take 1 capsule (60 mg total) by mouth daily.   EPIPEN 2-PAK 0.3 mg/0.3 mL Soaj injection Generic drug:  EPINEPHrine USE AS DIRECTED FOR SEVERE LIFE-THREATENING ALLERGIC REACTION.   FIBER PO Take by mouth.   fluticasone 50 MCG/ACT nasal spray Commonly known as:  FLONASE USE ONE SPRAY IN EACH NOSTRIL TWICE DAILY   ibuprofen 800 MG tablet Commonly known as:  ADVIL,MOTRIN Twice daily- take w/ food   Mometasone Furoate 200 MCG/ACT Aero Commonly known as:  ASMANEX HFA Inhale 2 puffs into the lungs 2 (two) times daily.   NON FORMULARY daily. Herbal supplement: Bowel Stimulant   omeprazole 40 MG capsule Commonly known as:  PRILOSEC TAKE 1 CAPSULE (40 MG TOTAL) BY MOUTH DAILY.   ranitidine 150 MG tablet Commonly known as:  ZANTAC Take 150 mg by mouth. Take on the day you receive allergy injections   Vitamin D3 5000 UNIT/ML  Liqd Take by mouth daily.       Past Medical History:  Diagnosis Date  . Allergy   . Asthma   . Depression   . Early menopause   . Hx of adenomatous polyp of colon 01/11/2016  . Hyperlipidemia   . Non-allergic rhinitis   . Obesity   . Osteoarthritis   . Personal history of colonic adenoma  11/26/2009  . Sinusitis acute   . URI (upper respiratory infection)     Past Surgical History:  Procedure Laterality Date  . CESAREAN SECTION  1988  . KNEE SURGERY Right 1998  . WISDOM TOOTH EXTRACTION  1984    Review of systems  negative except as noted in HPI / PMHx or noted below:  Review of Systems  Constitutional: Negative.   HENT: Negative.   Eyes: Negative.   Respiratory: Negative.   Cardiovascular: Negative.   Gastrointestinal: Negative.   Genitourinary: Negative.   Musculoskeletal: Negative.   Skin: Negative.   Neurological: Negative.   Endo/Heme/Allergies: Negative.   Psychiatric/Behavioral: Negative.      Objective:   Vitals:   08/15/16 1008  BP: 110/70  Pulse: 80  Resp: 14          Physical Exam  Constitutional: She is well-developed, well-nourished, and in no distress.  HENT:  Head: Normocephalic.  Right Ear: Tympanic membrane, external ear and ear canal normal.  Left Ear: Tympanic membrane, external ear and ear canal normal.  Nose: Nose normal. No mucosal edema or rhinorrhea.  Mouth/Throat: Uvula is midline, oropharynx is clear and moist and mucous membranes are normal. No oropharyngeal exudate.  Eyes: Conjunctivae are normal.  Neck: Trachea normal. No tracheal tenderness present. No tracheal deviation present. No thyromegaly present.  Cardiovascular: Normal rate, regular rhythm, S1 normal, S2 normal and normal heart sounds.   No murmur heard. Pulmonary/Chest: Breath sounds normal. No stridor. No respiratory distress. She has no wheezes. She has no rales.  Musculoskeletal: She exhibits no edema.  Lymphadenopathy:       Head (right side): No tonsillar adenopathy present.       Head (left side): No tonsillar adenopathy present.    She has no cervical adenopathy.  Neurological: She is alert. Gait normal.  Skin: No rash noted. She is not diaphoretic. No erythema. Nails show no clubbing.  Psychiatric: Mood and affect normal.    Diagnostics:    Spirometry was performed and demonstrated an FEV1 of 2.01 at 83 % of predicted.  The patient had an Asthma Control Test with the following results: ACT Total Score: 21.    Assessment and Plan:   1. Asthma, moderate persistent,  well-controlled   2. Other allergic rhinitis   3. Headache disorder     1. Continue Immunotherapy and Epi-Pen  2. Continue Flonase 1-2 sprays each nostril 3-7 times per week   3. Continue Asmanex 200 HFA two inhalations two times per day.   4. Start Montelukast 10mg  one tablet one time per day  5. Continue claritin / zyrtec / allegra , nasal saline if needed  6. Continue ProairHFA if needed  7. Obtain a flu vaccine  8. Return in 6 months or earlier if problem  I'm going to start Loys on a leukotriene modifier in addition to her anti-inflammatory therapy for her respiratory tract which includes a nasal steroid and an inhaled steroid as we go through this upcoming springtime season. She should have good response to immunotherapy although it may be a little bit early to see a  good response as we go through this upcoming spring. I suspect the next spring season she'll have an excellent response. She'll contact me should she have significant problems in the face of the therapy noted above. She is going to come back some time this week to get a flu vaccine.  Allena Katz, MD Allergy / Immunology [facility]

## 2016-08-17 ENCOUNTER — Encounter: Payer: Self-pay | Admitting: *Deleted

## 2016-08-19 MED ORDER — DULOXETINE HCL 30 MG PO CPEP: 30.0000 mg | ORAL_CAPSULE | Freq: Every day | ORAL | 1 refills | Status: DC

## 2016-08-19 NOTE — Addendum Note (Signed)
Addended by: Davis Gourd on: 08/19/2016 02:37 PM   Modules accepted: Orders

## 2016-08-19 NOTE — Telephone Encounter (Signed)
Medication filled to pharmacy as requested.   

## 2016-08-19 NOTE — Telephone Encounter (Signed)
Pt states that she did not receive a call to clarify Rx, Pt states that she takes both Cymbalta 60mg  and Cymbalta 30mg . That her and KT talked about this at her last appt and still needs the 30mg  [facility]

## 2016-08-25 ENCOUNTER — Ambulatory Visit (INDEPENDENT_AMBULATORY_CARE_PROVIDER_SITE_OTHER): Payer: BLUE CROSS/BLUE SHIELD | Admitting: *Deleted

## 2016-08-25 DIAGNOSIS — J309 Allergic rhinitis, unspecified: Secondary | ICD-10-CM | POA: Diagnosis not present

## 2016-09-01 ENCOUNTER — Ambulatory Visit (INDEPENDENT_AMBULATORY_CARE_PROVIDER_SITE_OTHER): Payer: BLUE CROSS/BLUE SHIELD | Admitting: *Deleted

## 2016-09-01 DIAGNOSIS — J309 Allergic rhinitis, unspecified: Secondary | ICD-10-CM

## 2016-09-08 ENCOUNTER — Encounter: Payer: Self-pay | Admitting: Allergy and Immunology

## 2016-09-08 ENCOUNTER — Ambulatory Visit (INDEPENDENT_AMBULATORY_CARE_PROVIDER_SITE_OTHER): Payer: BLUE CROSS/BLUE SHIELD | Admitting: Allergy and Immunology

## 2016-09-08 ENCOUNTER — Telehealth: Payer: Self-pay | Admitting: *Deleted

## 2016-09-08 ENCOUNTER — Ambulatory Visit: Payer: BLUE CROSS/BLUE SHIELD | Admitting: Allergy and Immunology

## 2016-09-08 VITALS — BP 120/78 | HR 76 | Resp 16

## 2016-09-08 DIAGNOSIS — J309 Allergic rhinitis, unspecified: Secondary | ICD-10-CM

## 2016-09-08 DIAGNOSIS — R071 Chest pain on breathing: Secondary | ICD-10-CM | POA: Diagnosis not present

## 2016-09-08 DIAGNOSIS — J4540 Moderate persistent asthma, uncomplicated: Secondary | ICD-10-CM | POA: Diagnosis not present

## 2016-09-08 DIAGNOSIS — R079 Chest pain, unspecified: Secondary | ICD-10-CM | POA: Diagnosis not present

## 2016-09-08 NOTE — Progress Notes (Signed)
Follow-up Note  Referring Provider: Midge Minium, MD Primary Provider: Annye Asa, MD Date of Office Visit: 09/08/2016  Subjective:   Tina Williams (DOB: 1960/08/25) is a 56 y.o. female who returns to the [facility] [place] on 09/08/2016 in re-evaluation of the following:  HPI: Renai returns to this clinic in reevaluation of her respiratory tract issues. She was last seen in this clinic on 08/15/2016 at which point in time she was doing relatively well regarding her asthma although she had some activity of her allergic rhinoconjunctivitis and we started her on montelukast.  She's done very well with her airway and has not had any specific problem with asthma or her nose. She does not use a short acting bronchodilator. Reflux has not been an issue. She's been consistently using all of her medical therapy as prescribed.  What is new is the fact that she has developed right-sided pleuritic chest pain over the course of the past 3 days. When she takes a deep breath she has right-sided sternal chest pain that migrates up into her clavicular area. This also appears to occur with rotation of her torso. It does not appear to occur with movement of her neck or movement of her arm. She has no other associated systemic or constitutional symptoms. She has not had a fever. She has not had any trauma. There has not been any type of environmental factor that may have contributed to this issue. She is taking Tylenol for pain. She cannot tolerate nonsteroidal anti-inflammatory drugs secondary to problems with bleeding in her skin.  Allergies as of 09/08/2016      Reactions   Amoxicillin Hives   Clarithromycin    REACTION: hives   Naproxen Sodium    REACTION: HIVES   Penicillins    REACTION: hives   Sulfa Antibiotics Hives      Medication List      cetirizine 10 MG tablet Commonly known as:  ZYRTEC TAKE ONE TABLET ONCE DAILY IF NEEDED   DULoxetine 60 MG capsule Commonly  known as:  CYMBALTA Take 1 capsule (60 mg total) by mouth daily.   DULoxetine 30 MG capsule Commonly known as:  CYMBALTA Take 1 capsule (30 mg total) by mouth daily.   EPIPEN 2-PAK 0.3 mg/0.3 mL Soaj injection Generic drug:  EPINEPHrine USE AS DIRECTED FOR SEVERE LIFE-THREATENING ALLERGIC REACTION.   FIBER PO Take by mouth.   fluticasone 50 MCG/ACT nasal spray Commonly known as:  FLONASE USE ONE SPRAY IN EACH NOSTRIL TWICE DAILY   Mometasone Furoate 200 MCG/ACT Aero Commonly known as:  ASMANEX HFA Inhale 2 puffs into the lungs 2 (two) times daily.   montelukast 10 MG tablet Commonly known as:  SINGULAIR Take one tablet once daily once daily as directed   NON FORMULARY daily. Herbal supplement: Bowel Stimulant   omeprazole 40 MG capsule Commonly known as:  PRILOSEC TAKE 1 CAPSULE (40 MG TOTAL) BY MOUTH DAILY.   PROAIR HFA 108 (90 Base) MCG/ACT inhaler Generic drug:  albuterol Inhale into the lungs every 6 (six) hours as needed for wheezing or shortness of breath.   ranitidine 150 MG tablet Commonly known as:  ZANTAC Take 150 mg by mouth. Take on the day you receive allergy injections   TYLENOL PO Take by mouth.       Past Medical History:  Diagnosis Date  . Allergy   . Asthma   . Depression   . Early menopause   . Hx of adenomatous polyp  of colon 01/11/2016  . Hyperlipidemia   . Non-allergic rhinitis   . Obesity   . Osteoarthritis   . Personal history of colonic adenoma  11/26/2009  . Sinusitis acute   . URI (upper respiratory infection)     Past Surgical History:  Procedure Laterality Date  . CESAREAN SECTION  1988  . KNEE SURGERY Right 1998  . WISDOM TOOTH EXTRACTION  1984    Review of systems negative except as noted in HPI / PMHx or noted below:  Review of Systems  Constitutional: Negative.   HENT: Negative.   Eyes: Negative.   Respiratory: Negative.   Cardiovascular: Negative.   Gastrointestinal: Negative.   Genitourinary: Negative.     Musculoskeletal: Negative.   Skin: Negative.   Neurological: Negative.   Endo/Heme/Allergies: Negative.   Psychiatric/Behavioral: Negative.      Objective:   Vitals:   09/08/16 0838  BP: 120/78  Pulse: 76  Resp: 16          Physical Exam  Constitutional: She is well-developed, well-nourished, and in no distress.  HENT:  Head: Normocephalic.  Right Ear: Tympanic membrane, external ear and ear canal normal.  Left Ear: Tympanic membrane, external ear and ear canal normal.  Nose: Nose normal. No mucosal edema or rhinorrhea.  Mouth/Throat: Uvula is midline, oropharynx is clear and moist and mucous membranes are normal. No oropharyngeal exudate.  Eyes: Conjunctivae are normal.  Neck: Trachea normal. No tracheal tenderness present. No tracheal deviation present. No thyromegaly present.  Cardiovascular: Normal rate, regular rhythm, S1 normal, S2 normal and normal heart sounds.   No murmur heard. Pulmonary/Chest: Breath sounds normal. No stridor. No respiratory distress. She has no wheezes. She has no rales.  No tenderness to palpation. No obvious dermatitis.  Musculoskeletal: She exhibits no edema.  Lymphadenopathy:       Head (right side): No tonsillar adenopathy present.       Head (left side): No tonsillar adenopathy present.    She has no cervical adenopathy.  Neurological: She is alert. Gait normal.  Skin: No rash noted. She is not diaphoretic. No erythema. Nails show no clubbing.  Psychiatric: Mood and affect normal.    Diagnostics:    Spirometry was performed and demonstrated an FE1 of 1.83 at 76 % of predicted.  Assessment and Plan:   1. Moderate persistent asthma without complication   2. Chronic allergic rhinitis, unspecified seasonality, unspecified trigger     1. Continue Immunotherapy and Epi-Pen  2. Continue Flonase 1-2 sprays each nostril 3-7 times per week   3. Continue Asmanex 200 HFA two inhalations two times per day.   4. Continue Montelukast  10mg  one tablet one time per day  5. Continue claritin / zyrtec / allegra , nasal saline if needed  6. Continue Proair HFA if needed  7. For recent right sided chest pain:   A. Prednisone 10mg  one tablet one time per day for 10 days  B. Can continue to use tylenol  C. Obtain Chest  Xray  8. Return in 6 months or earlier if problem  Shandria has some form of pleuritic right sided anterior chest pain that is either chest wall inflammation or pleural inflammation or nerve pain. I will treat her with a very low dose of systemic steroids to help with any inflammation and obtain a chest x-ray in evaluation of a pleural issue. I will contact her with the results of her x-ray once it's available for review and will make a decision about how  to proceed pending her response and the results of that study. Overall her atopic respiratory disease appears to be under good control of this point in time.  Allena Katz, MD [facility] / [facility] [place]

## 2016-09-08 NOTE — Telephone Encounter (Signed)
Informed Tina Williams that her chest x-ray from this morning (09-08-16) was normal per Dr. Neldon Mc. Results scanned in.

## 2016-09-08 NOTE — Patient Instructions (Addendum)
  1. Continue Immunotherapy and Epi-Pen  2. Continue Flonase 1-2 sprays each nostril 3-7 times per week   3. Continue Asmanex 200 HFA two inhalations two times per day.   4. Continue Montelukast 10mg  one tablet one time per day  5. Continue claritin / zyrtec / allegra , nasal saline if needed  6. Continue Proair HFA if needed  7. For recent right sided chest pain:   A. Prednisone 10mg  one tablet one time per day for 10 days  B. Can continue to use tylenol  C. Obtain Chest  Xray  8. Return in 6 months or earlier if problem

## 2016-09-22 ENCOUNTER — Ambulatory Visit (INDEPENDENT_AMBULATORY_CARE_PROVIDER_SITE_OTHER): Payer: BLUE CROSS/BLUE SHIELD | Admitting: *Deleted

## 2016-09-22 DIAGNOSIS — J309 Allergic rhinitis, unspecified: Secondary | ICD-10-CM

## 2016-10-06 ENCOUNTER — Ambulatory Visit (INDEPENDENT_AMBULATORY_CARE_PROVIDER_SITE_OTHER): Payer: BLUE CROSS/BLUE SHIELD | Admitting: *Deleted

## 2016-10-06 DIAGNOSIS — J309 Allergic rhinitis, unspecified: Secondary | ICD-10-CM

## 2016-10-13 ENCOUNTER — Ambulatory Visit (INDEPENDENT_AMBULATORY_CARE_PROVIDER_SITE_OTHER): Payer: BLUE CROSS/BLUE SHIELD | Admitting: *Deleted

## 2016-10-13 DIAGNOSIS — J309 Allergic rhinitis, unspecified: Secondary | ICD-10-CM

## 2016-10-20 DIAGNOSIS — J0100 Acute maxillary sinusitis, unspecified: Secondary | ICD-10-CM | POA: Diagnosis not present

## 2016-10-27 ENCOUNTER — Ambulatory Visit (INDEPENDENT_AMBULATORY_CARE_PROVIDER_SITE_OTHER): Payer: BLUE CROSS/BLUE SHIELD | Admitting: *Deleted

## 2016-10-27 DIAGNOSIS — J309 Allergic rhinitis, unspecified: Secondary | ICD-10-CM

## 2016-10-28 ENCOUNTER — Ambulatory Visit (INDEPENDENT_AMBULATORY_CARE_PROVIDER_SITE_OTHER): Payer: BLUE CROSS/BLUE SHIELD | Admitting: Family Medicine

## 2016-10-28 ENCOUNTER — Encounter: Payer: Self-pay | Admitting: Family Medicine

## 2016-10-28 VITALS — BP 112/72 | HR 72 | Temp 98.4°F | Resp 16 | Ht 61.0 in | Wt 162.4 lb

## 2016-10-28 DIAGNOSIS — E785 Hyperlipidemia, unspecified: Secondary | ICD-10-CM | POA: Diagnosis not present

## 2016-10-28 DIAGNOSIS — M2550 Pain in unspecified joint: Secondary | ICD-10-CM | POA: Diagnosis not present

## 2016-10-28 DIAGNOSIS — E559 Vitamin D deficiency, unspecified: Secondary | ICD-10-CM | POA: Diagnosis not present

## 2016-10-28 DIAGNOSIS — Z0000 Encounter for general adult medical examination without abnormal findings: Secondary | ICD-10-CM

## 2016-10-28 HISTORY — DX: Vitamin D deficiency, unspecified: E55.9

## 2016-10-28 LAB — TSH: TSH: 1.43 u[IU]/mL (ref 0.35–4.50)

## 2016-10-28 LAB — CBC WITH DIFFERENTIAL/PLATELET
Basophils Absolute: 0.1 10*3/uL (ref 0.0–0.1)
Basophils Relative: 0.9 % (ref 0.0–3.0)
EOS PCT: 3.4 % (ref 0.0–5.0)
Eosinophils Absolute: 0.3 10*3/uL (ref 0.0–0.7)
HEMATOCRIT: 42 % (ref 36.0–46.0)
HEMOGLOBIN: 13.3 g/dL (ref 12.0–15.0)
LYMPHS PCT: 22.4 % (ref 12.0–46.0)
Lymphs Abs: 1.9 10*3/uL (ref 0.7–4.0)
MCHC: 31.6 g/dL (ref 30.0–36.0)
MCV: 87.1 fl (ref 78.0–100.0)
MONO ABS: 0.9 10*3/uL (ref 0.1–1.0)
Monocytes Relative: 9.9 % (ref 3.0–12.0)
Neutro Abs: 5.5 10*3/uL (ref 1.4–7.7)
Neutrophils Relative %: 63.4 % (ref 43.0–77.0)
Platelets: 471 10*3/uL — ABNORMAL HIGH (ref 150.0–400.0)
RBC: 4.82 Mil/uL (ref 3.87–5.11)
RDW: 15.9 % — ABNORMAL HIGH (ref 11.5–15.5)
WBC: 8.6 10*3/uL (ref 4.0–10.5)

## 2016-10-28 LAB — HEPATIC FUNCTION PANEL
ALBUMIN: 4.4 g/dL (ref 3.5–5.2)
ALT: 13 U/L (ref 0–35)
AST: 12 U/L (ref 0–37)
Alkaline Phosphatase: 82 U/L (ref 39–117)
Bilirubin, Direct: 0.1 mg/dL (ref 0.0–0.3)
TOTAL PROTEIN: 6.7 g/dL (ref 6.0–8.3)
Total Bilirubin: 0.9 mg/dL (ref 0.2–1.2)

## 2016-10-28 LAB — LIPID PANEL
Cholesterol: 239 mg/dL — ABNORMAL HIGH (ref 0–200)
HDL: 66.8 mg/dL (ref >39.00)
LDL Cholesterol: 148 mg/dL — ABNORMAL HIGH (ref 0–99)
NONHDL: 172.37
Total CHOL/HDL Ratio: 4
Triglycerides: 120 mg/dL (ref 0.0–149.0)
VLDL: 24 mg/dL (ref 0.0–40.0)

## 2016-10-28 LAB — BASIC METABOLIC PANEL
BUN: 11 mg/dL (ref 6–23)
CALCIUM: 9.6 mg/dL (ref 8.4–10.5)
CO2: 31 meq/L (ref 19–32)
CREATININE: 0.67 mg/dL (ref 0.40–1.20)
Chloride: 103 mEq/L (ref 96–112)
GFR: 96.81 mL/min (ref >60.00)
Glucose, Bld: 87 mg/dL (ref 70–99)
Potassium: 4.7 mEq/L (ref 3.5–5.1)
Sodium: 141 mEq/L (ref 135–145)

## 2016-10-28 LAB — VITAMIN D 25 HYDROXY (VIT D DEFICIENCY, FRACTURES): VITD: 27.47 ng/mL — ABNORMAL LOW (ref 30.00–100.00)

## 2016-10-28 NOTE — Progress Notes (Signed)
   Subjective:    Patient ID: Tina Williams, female    DOB: [birth year], 56 y.o.   MRN: [id number]  HPI CPE- UTD on colonoscopy, due for pap and mammo w/ Dr Matthew Saras.  UTD on immunizations.  Pt has lost 9 lbs since last visit due to dietary changes.   Review of Systems Patient reports no vision/ hearing changes, adenopathy,fever, weight change,  persistant/recurrent hoarseness , swallowing issues, chest pain, palpitations, edema, persistant/recurrent cough, hemoptysis, dyspnea (rest/exertional/paroxysmal nocturnal), gastrointestinal bleeding (melena, rectal bleeding), abdominal pain, significant heartburn, bowel changes, GU symptoms (dysuria, hematuria, incontinence), Gyn symptoms (abnormal  bleeding, pain),  syncope, focal weakness, memory loss, numbness & tingling, skin/hair/nail changes, abnormal bruising or bleeding, anxiety, or depression.   + joint pain- worse in hands, wrists and feet    Objective:   Physical Exam General Appearance:    Alert, cooperative, no distress, appears stated age  Head:    Normocephalic, without obvious abnormality, atraumatic  Eyes:    PERRL, conjunctiva/corneas clear, EOM's intact, fundi    benign, both eyes  Ears:    Normal TM's and external ear canals, both ears  Nose:   Nares normal, septum midline, mucosa normal, no drainage    or sinus tenderness  Throat:   Lips, mucosa, and tongue normal; teeth and gums normal  Neck:   Supple, symmetrical, trachea midline, no adenopathy;    Thyroid: no enlargement/tenderness/nodules  Back:     Symmetric, no curvature, ROM normal, no CVA tenderness  Lungs:     Clear to auscultation bilaterally, respirations unlabored  Chest Wall:    No tenderness or deformity   Heart:    Regular rate and rhythm, S1 and S2 normal, no murmur, rub   or gallop  Breast Exam:    Deferred to GYN  Abdomen:     Soft, non-tender, bowel sounds active all four quadrants,    no masses, no organomegaly  Genitalia:    Deferred to GYN  Rectal:      Extremities:   Extremities normal, atraumatic, no cyanosis or edema  Pulses:   2+ and symmetric all extremities  Skin:   Skin color, texture, turgor normal, no rashes or lesions  Lymph nodes:   Cervical, supraclavicular, and axillary nodes normal  Neurologic:   CNII-XII intact, normal strength, sensation and reflexes    throughout          Assessment & Plan:

## 2016-10-28 NOTE — Patient Instructions (Signed)
Follow up in 6 months to recheck cholesterol We'll notify you of your lab results and make any changes if needed Continue to work on healthy diet and regular exercise- you can do it! CALL AND SCHEDULE YOUR MAMMOGRAM!!! Call with any questions or concerns Happy Mother's Day!

## 2016-10-28 NOTE — Assessment & Plan Note (Signed)
Check labs.  Replete prn. 

## 2016-10-28 NOTE — Progress Notes (Signed)
Pre visit review using our clinic review tool, if applicable. No additional management support is needed unless otherwise documented below in the visit note. 

## 2016-10-28 NOTE — Assessment & Plan Note (Signed)
Chronic problem.  Not currently on statin.  Check labs.  Start meds prn.

## 2016-10-28 NOTE — Assessment & Plan Note (Signed)
Pt's PE unchanged from previous and WNL w/ exception of being overweight.  Encouraged pt to schedule mammo.  Check labs.  Anticipatory guidance provided.

## 2016-10-28 NOTE — Assessment & Plan Note (Signed)
New.  Pt reports daily pain in wrists, hands, and feet.  Check RA and ANA to r/o autoimmune process.  Will follow.

## 2016-10-31 DIAGNOSIS — J301 Allergic rhinitis due to pollen: Secondary | ICD-10-CM | POA: Diagnosis not present

## 2016-10-31 LAB — RHEUMATOID FACTOR

## 2016-10-31 LAB — ANA: ANA: NEGATIVE

## 2016-11-01 ENCOUNTER — Other Ambulatory Visit: Payer: Self-pay | Admitting: General Practice

## 2016-11-01 MED ORDER — VITAMIN D (ERGOCALCIFEROL) 1.25 MG (50000 UNIT) PO CAPS: 50000.0000 [IU] | ORAL_CAPSULE | ORAL | 0 refills | Status: DC

## 2016-11-03 ENCOUNTER — Ambulatory Visit (INDEPENDENT_AMBULATORY_CARE_PROVIDER_SITE_OTHER): Payer: BLUE CROSS/BLUE SHIELD | Admitting: *Deleted

## 2016-11-03 DIAGNOSIS — J309 Allergic rhinitis, unspecified: Secondary | ICD-10-CM

## 2016-11-10 ENCOUNTER — Ambulatory Visit (INDEPENDENT_AMBULATORY_CARE_PROVIDER_SITE_OTHER): Payer: BLUE CROSS/BLUE SHIELD | Admitting: *Deleted

## 2016-11-10 DIAGNOSIS — J309 Allergic rhinitis, unspecified: Secondary | ICD-10-CM | POA: Diagnosis not present

## 2016-11-17 ENCOUNTER — Ambulatory Visit (INDEPENDENT_AMBULATORY_CARE_PROVIDER_SITE_OTHER): Payer: BLUE CROSS/BLUE SHIELD

## 2016-11-17 DIAGNOSIS — J309 Allergic rhinitis, unspecified: Secondary | ICD-10-CM

## 2016-11-25 ENCOUNTER — Ambulatory Visit (INDEPENDENT_AMBULATORY_CARE_PROVIDER_SITE_OTHER): Payer: BLUE CROSS/BLUE SHIELD

## 2016-11-25 DIAGNOSIS — J309 Allergic rhinitis, unspecified: Secondary | ICD-10-CM | POA: Diagnosis not present

## 2016-11-29 ENCOUNTER — Encounter: Payer: Self-pay | Admitting: Family Medicine

## 2016-12-02 ENCOUNTER — Ambulatory Visit (INDEPENDENT_AMBULATORY_CARE_PROVIDER_SITE_OTHER): Payer: BLUE CROSS/BLUE SHIELD | Admitting: *Deleted

## 2016-12-02 DIAGNOSIS — J309 Allergic rhinitis, unspecified: Secondary | ICD-10-CM

## 2016-12-09 ENCOUNTER — Ambulatory Visit (INDEPENDENT_AMBULATORY_CARE_PROVIDER_SITE_OTHER): Payer: BLUE CROSS/BLUE SHIELD | Admitting: *Deleted

## 2016-12-09 DIAGNOSIS — J309 Allergic rhinitis, unspecified: Secondary | ICD-10-CM | POA: Diagnosis not present

## 2016-12-16 ENCOUNTER — Ambulatory Visit (INDEPENDENT_AMBULATORY_CARE_PROVIDER_SITE_OTHER): Payer: BLUE CROSS/BLUE SHIELD | Admitting: *Deleted

## 2016-12-16 DIAGNOSIS — J309 Allergic rhinitis, unspecified: Secondary | ICD-10-CM

## 2016-12-23 ENCOUNTER — Ambulatory Visit (INDEPENDENT_AMBULATORY_CARE_PROVIDER_SITE_OTHER): Payer: BLUE CROSS/BLUE SHIELD | Admitting: *Deleted

## 2016-12-23 DIAGNOSIS — J309 Allergic rhinitis, unspecified: Secondary | ICD-10-CM

## 2016-12-30 ENCOUNTER — Ambulatory Visit (INDEPENDENT_AMBULATORY_CARE_PROVIDER_SITE_OTHER): Payer: BLUE CROSS/BLUE SHIELD | Admitting: *Deleted

## 2016-12-30 DIAGNOSIS — J309 Allergic rhinitis, unspecified: Secondary | ICD-10-CM

## 2017-01-02 DIAGNOSIS — J301 Allergic rhinitis due to pollen: Secondary | ICD-10-CM | POA: Diagnosis not present

## 2017-01-02 DIAGNOSIS — J0100 Acute maxillary sinusitis, unspecified: Secondary | ICD-10-CM | POA: Diagnosis not present

## 2017-01-02 DIAGNOSIS — H66009 Acute suppurative otitis media without spontaneous rupture of ear drum, unspecified ear: Secondary | ICD-10-CM | POA: Diagnosis not present

## 2017-01-06 ENCOUNTER — Telehealth: Payer: Self-pay | Admitting: Allergy and Immunology

## 2017-01-06 ENCOUNTER — Other Ambulatory Visit: Payer: Self-pay | Admitting: Allergy

## 2017-01-06 MED ORDER — MOMETASONE FUROATE 200 MCG/ACT IN AERO: 2.0000 | INHALATION_SPRAY | Freq: Two times a day (BID) | RESPIRATORY_TRACT | 0 refills | Status: DC

## 2017-01-06 NOTE — Telephone Encounter (Signed)
Patient's pharmacy has already been changed.

## 2017-01-06 NOTE — Telephone Encounter (Signed)
Tina Williams would like her pharmacy updated to [facility] in [place] on [address]

## 2017-01-08 DIAGNOSIS — R0602 Shortness of breath: Secondary | ICD-10-CM | POA: Diagnosis not present

## 2017-01-08 DIAGNOSIS — J189 Pneumonia, unspecified organism: Secondary | ICD-10-CM | POA: Diagnosis not present

## 2017-01-08 DIAGNOSIS — J0100 Acute maxillary sinusitis, unspecified: Secondary | ICD-10-CM | POA: Diagnosis not present

## 2017-01-10 ENCOUNTER — Encounter: Payer: Self-pay | Admitting: Emergency Medicine

## 2017-01-10 ENCOUNTER — Encounter: Payer: Self-pay | Admitting: Physician Assistant

## 2017-01-10 ENCOUNTER — Ambulatory Visit (INDEPENDENT_AMBULATORY_CARE_PROVIDER_SITE_OTHER): Payer: BLUE CROSS/BLUE SHIELD | Admitting: Physician Assistant

## 2017-01-10 VITALS — BP 130/82 | HR 97 | Temp 98.7°F | Resp 14 | Ht 61.0 in | Wt 162.0 lb

## 2017-01-10 DIAGNOSIS — J189 Pneumonia, unspecified organism: Secondary | ICD-10-CM | POA: Diagnosis not present

## 2017-01-10 LAB — CBC WITH DIFFERENTIAL/PLATELET
BASOS PCT: 0 %
Basophils Absolute: 0 cells/uL (ref 0–200)
EOS PCT: 0 %
Eosinophils Absolute: 0 cells/uL — ABNORMAL LOW (ref 15–500)
HEMATOCRIT: 45.9 % — AB (ref 35.0–45.0)
HEMOGLOBIN: 14.7 g/dL (ref 11.7–15.5)
LYMPHS ABS: 2299 {cells}/uL (ref 850–3900)
Lymphocytes Relative: 11 %
MCH: 27.9 pg (ref 27.0–33.0)
MCHC: 32 g/dL (ref 32.0–36.0)
MCV: 87.1 fL (ref 80.0–100.0)
MONO ABS: 1463 {cells}/uL — AB (ref 200–950)
MPV: 9 fL (ref 7.5–12.5)
Monocytes Relative: 7 %
NEUTROS PCT: 82 %
Neutro Abs: 17138 cells/uL — ABNORMAL HIGH (ref 1500–7800)
Platelets: 568 10*3/uL — ABNORMAL HIGH (ref 140–400)
RBC: 5.27 MIL/uL — AB (ref 3.80–5.10)
RDW: 14.7 % (ref 11.0–15.0)
WBC: 20.9 10*3/uL — AB (ref 3.8–10.8)

## 2017-01-10 MED ORDER — BENZONATATE 100 MG PO CAPS: 100.0000 mg | ORAL_CAPSULE | Freq: Two times a day (BID) | ORAL | 0 refills | Status: DC | PRN

## 2017-01-10 NOTE — Patient Instructions (Signed)
Please go to the lab for blood work. I will call you with your results.  Please make sure to use albuterol inhaler and asmanex inhalers as directed. Continue singulair. Restart a daily Claritin. Use Tessalon as directed for cough.   Again the x-ray results I received from the urgent care today are negative.  Finish the Azithromycin as directed to complete treatment course. If will take time for energy level to improve but we are checking for other causes today.  Follow-up with me on Friday.

## 2017-01-10 NOTE — Progress Notes (Signed)
Patient presents to clinic today c/o fatigue associated with cough and chest congestion. Patient states she started with URI symptoms -- sinus pressure, sinus pain and R ear pain at the beginning of July. Was seen at [facility] on 12/26/16 and diagnosed with acute sinusitis. Was started on a steroid taper and round of Omnicef. Endorses completing regimen as directed with resolution of sinus symptoms. Noted symptoms moved into chest and she began having a productive cough, wheezing and fatigue. Returned to Urgent Care on 01/08/17. Endorses having an x-ray performed and was told she had a pneumonia in her R lung. Was started on Azithromycin and given a Rocephin injection and steroid shot at time of visit. Has taken Azithromycin as directed. Notes improvement in cough and congestion. Notes occasional wheeze and chest tightness but still notes significant fatigue. Denies new or worsening symptoms.   Past Medical History:  Diagnosis Date  . Allergy   . Asthma   . Depression   . Early menopause   . Hx of adenomatous polyp of colon 01/11/2016  . Hyperlipidemia   . Non-allergic rhinitis   . Obesity   . Osteoarthritis   . Personal history of colonic adenoma  11/26/2009  . Sinusitis acute   . URI (upper respiratory infection)     Current Outpatient Prescriptions on File Prior to Visit  Medication Sig Dispense Refill  . Acetaminophen (TYLENOL PO) Take by mouth.    Marland Kitchen albuterol (PROAIR HFA) 108 (90 Base) MCG/ACT inhaler Inhale into the lungs every 6 (six) hours as needed for wheezing or shortness of breath.    . DULoxetine (CYMBALTA) 30 MG capsule Take 1 capsule (30 mg total) by mouth daily. 90 capsule 1  . DULoxetine (CYMBALTA) 60 MG capsule Take 1 capsule (60 mg total) by mouth daily. 90 capsule 1  . EPIPEN 2-PAK 0.3 MG/0.3ML SOAJ injection USE AS DIRECTED FOR SEVERE LIFE-THREATENING ALLERGIC REACTION. 2 Device 0  . FIBER PO Take by mouth.    . Mometasone Furoate (ASMANEX HFA) 200 MCG/ACT  AERO Inhale 2 puffs into the lungs 2 (two) times daily. 3 Inhaler 0  . montelukast (SINGULAIR) 10 MG tablet Take one tablet once daily once daily as directed 90 tablet 1  . NON FORMULARY daily. Herbal supplement: Bowel Stimulant    . omeprazole (PRILOSEC) 40 MG capsule TAKE 1 CAPSULE (40 MG TOTAL) BY MOUTH DAILY. 90 capsule 1  . ranitidine (ZANTAC) 150 MG tablet Take 150 mg by mouth. Take on the day you receive allergy injections    . fluticasone (FLONASE) 50 MCG/ACT nasal spray USE ONE SPRAY IN EACH NOSTRIL TWICE DAILY (Patient not taking: Reported on 01/10/2017) 48 g 1  . Vitamin D, Ergocalciferol, (DRISDOL) 50000 units CAPS capsule Take 1 capsule (50,000 Units total) by mouth every 7 (seven) days. (Patient not taking: Reported on 01/10/2017) 12 capsule 0   No current facility-administered medications on file prior to visit.     Allergies  Allergen Reactions  . Amoxicillin Hives  . Clarithromycin     REACTION: hives  . Naproxen Sodium     REACTION: HIVES  . Penicillins     REACTION: hives  . Sulfa Antibiotics Hives    Family History  Problem Relation Age of Onset  . Colon cancer Sister 10  . Hypertension Mother   . Diabetes Mother   . Diabetes Brother   . Diabetes Sister   . Lung cancer Sister        was a smoker  .  Emphysema Father        was a smoker    Social History   Social History  . Marital status: Married    Spouse name: N/A  . Number of children: 3  . Years of education: N/A   Occupational History  . Teacher's Aid    Social History Main Topics  . Smoking status: Former Smoker    Packs/day: 0.50    Years: 18.00    Types: Cigarettes    Quit date: 06/28/1999  . Smokeless tobacco: Never Used  . Alcohol use 1.8 oz/week    3 Glasses of wine per week  . Drug use: No  . Sexual activity: Not Asked   Other Topics Concern  . None   Social History Narrative  . None   Review of Systems - See HPI.  All other ROS are negative.  BP 130/82   Pulse 97   Temp  98.7 F (37.1 C) (Oral)   Resp 14   Ht 5\' 1"  (1.549 m)   Wt 162 lb (73.5 kg)   SpO2 97%   BMI 30.61 kg/m   Physical Exam  Constitutional: She is oriented to person, place, and time and well-developed, well-nourished, and in no distress.  HENT:  Head: Normocephalic and atraumatic.  Right Ear: External ear normal.  Left Ear: External ear normal.  Nose: Nose normal.  Mouth/Throat: Oropharynx is clear and moist. No oropharyngeal exudate.  TM within normal limits bilaterally.   Eyes: Pupils are equal, round, and reactive to light. Conjunctivae are normal.  Neck: Neck supple.  Cardiovascular: Normal rate, regular rhythm, normal heart sounds and intact distal pulses.   Pulmonary/Chest: Effort normal. No respiratory distress. She has wheezes. She has no rales. She exhibits no tenderness.  Abdominal: Soft. Bowel sounds are normal. She exhibits no distension. There is no tenderness.  Lymphadenopathy:    She has no cervical adenopathy.  Neurological: She is alert and oriented to person, place, and time. No cranial nerve deficit.  Skin: Skin is warm and dry. No rash noted.  Psychiatric: Affect normal.  Vitals reviewed.   Recent Results (from the past 2160 hour(s))  Lipid panel     Status: Abnormal   Collection Time: 10/28/16  9:34 AM  Result Value Ref Range   Cholesterol 239 (H) 0 - 200 mg/dL    Comment: ATP III Classification       Desirable:  < 200 mg/dL               Borderline High:  200 - 239 mg/dL          High:  > = 240 mg/dL   Triglycerides 120.0 0.0 - 149.0 mg/dL    Comment: Normal:  <150 mg/dLBorderline High:  150 - 199 mg/dL   HDL 66.80 >39.00 mg/dL   VLDL 24.0 0.0 - 40.0 mg/dL   LDL Cholesterol 148 (H) 0 - 99 mg/dL   Total CHOL/HDL Ratio 4     Comment:                Men          Women1/2 Average Risk     3.4          3.3Average Risk          5.0          4.42X Average Risk          9.6          7.13X Average Risk  15.0          11.0                       NonHDL  172.37     Comment: NOTE:  Non-HDL goal should be 30 mg/dL higher than patient's LDL goal (i.e. LDL goal of < 70 mg/dL, would have non-HDL goal of < 100 mg/dL)  Basic metabolic panel     Status: None   Collection Time: 10/28/16  9:34 AM  Result Value Ref Range   Sodium 141 135 - 145 mEq/L   Potassium 4.7 3.5 - 5.1 mEq/L   Chloride 103 96 - 112 mEq/L   CO2 31 19 - 32 mEq/L   Glucose, Bld 87 70 - 99 mg/dL   BUN 11 6 - 23 mg/dL   Creatinine, Ser 0.67 0.40 - 1.20 mg/dL   Calcium 9.6 8.4 - 10.5 mg/dL   GFR 96.81 >60.00 mL/min  TSH     Status: None   Collection Time: 10/28/16  9:34 AM  Result Value Ref Range   TSH 1.43 0.35 - 4.50 uIU/mL  Hepatic function panel     Status: None   Collection Time: 10/28/16  9:34 AM  Result Value Ref Range   Total Bilirubin 0.9 0.2 - 1.2 mg/dL   Bilirubin, Direct 0.1 0.0 - 0.3 mg/dL   Alkaline Phosphatase 82 39 - 117 U/L   AST 12 0 - 37 U/L   ALT 13 0 - 35 U/L   Total Protein 6.7 6.0 - 8.3 g/dL   Albumin 4.4 3.5 - 5.2 g/dL  CBC with Differential/Platelet     Status: Abnormal   Collection Time: 10/28/16  9:34 AM  Result Value Ref Range   WBC 8.6 4.0 - 10.5 K/uL   RBC 4.82 3.87 - 5.11 Mil/uL   Hemoglobin 13.3 12.0 - 15.0 g/dL   HCT 42.0 36.0 - 46.0 %   MCV 87.1 78.0 - 100.0 fl   MCHC 31.6 30.0 - 36.0 g/dL   RDW 15.9 (H) 11.5 - 15.5 %   Platelets 471.0 (H) 150.0 - 400.0 K/uL   Neutrophils Relative % 63.4 43.0 - 77.0 %   Lymphocytes Relative 22.4 12.0 - 46.0 %   Monocytes Relative 9.9 3.0 - 12.0 %   Eosinophils Relative 3.4 0.0 - 5.0 %   Basophils Relative 0.9 0.0 - 3.0 %   Neutro Abs 5.5 1.4 - 7.7 K/uL   Lymphs Abs 1.9 0.7 - 4.0 K/uL   Monocytes Absolute 0.9 0.1 - 1.0 K/uL   Eosinophils Absolute 0.3 0.0 - 0.7 K/uL   Basophils Absolute 0.1 0.0 - 0.1 K/uL  Rheumatoid Factor     Status: None   Collection Time: 10/28/16  9:34 AM  Result Value Ref Range   Rhuematoid fact SerPl-aCnc <14 <14 IU/mL  Antinuclear Antib (ANA)     Status: None    Collection Time: 10/28/16  9:34 AM  Result Value Ref Range   Anit Nuclear Antibody(ANA) NEG NEGATIVE  VITAMIN D 25 Hydroxy (Vit-D Deficiency, Fractures)     Status: Abnormal   Collection Time: 10/28/16  9:34 AM  Result Value Ref Range   VITD 27.47 (L) 30.00 - 100.00 ng/mL    Assessment/Plan: 1. Community acquired pneumonia of right lung, unspecified part of lung Albuterol neb given with significant improvement. Obtained x-ray results from [facility] which will be scanned into the chart. X-ray is negative for any sign of pneumonia but instructions written  on the result state continue treatment for CAP. Unsure of this decision making. Will continue Azithromycin for now for acute bronchitis. Will check CBC w diff and procalcitonin level to further assess. Reviewed proper use of inhalers. Start Tessalon. Follow-up scheduled.   - CBC with Differential/Platelet - Procalcitonin   Leeanne Rio, PA-C

## 2017-01-10 NOTE — Progress Notes (Signed)
Pre visit review using our clinic review tool, if applicable. No additional management support is needed unless otherwise documented below in the visit note. 

## 2017-01-12 LAB — PROCALCITONIN

## 2017-01-13 ENCOUNTER — Encounter: Payer: Self-pay | Admitting: Physician Assistant

## 2017-01-13 ENCOUNTER — Ambulatory Visit (INDEPENDENT_AMBULATORY_CARE_PROVIDER_SITE_OTHER): Payer: BLUE CROSS/BLUE SHIELD | Admitting: Physician Assistant

## 2017-01-13 VITALS — BP 108/78 | HR 103 | Temp 98.4°F | Resp 14 | Ht 61.0 in | Wt 162.0 lb

## 2017-01-13 DIAGNOSIS — J988 Other specified respiratory disorders: Secondary | ICD-10-CM

## 2017-01-13 DIAGNOSIS — J069 Acute upper respiratory infection, unspecified: Secondary | ICD-10-CM | POA: Insufficient documentation

## 2017-01-13 MED ORDER — MOMETASONE FURO-FORMOTEROL FUM 200-5 MCG/ACT IN AERO: 2.0000 | INHALATION_SPRAY | Freq: Two times a day (BID) | RESPIRATORY_TRACT | 1 refills | Status: DC

## 2017-01-13 NOTE — Progress Notes (Signed)
Pre visit review using our clinic review tool, if applicable. No additional management support is needed unless otherwise documented below in the visit note. 

## 2017-01-13 NOTE — Progress Notes (Signed)
Patient presents to clinic today for follow-up of acute bacterial bronchitis. Patient is taking Azithromycin as directed. Noting complete resolution of fever and increase in energy levels. Cough is subsiding. Still noting occasional wheeze despite Asmanex, Singulair and albuterol. As noted at last visit, patient has been given multiple steroid bursts/injections at visits at [facility] recently. Labs at last visit revealed leukocytosis with neutrophilia. CXR received from UC was negative.   Past Medical History:  Diagnosis Date  . Allergy   . Asthma   . Depression   . Early menopause   . Hx of adenomatous polyp of colon 01/11/2016  . Hyperlipidemia   . Non-allergic rhinitis   . Obesity   . Osteoarthritis   . Personal history of colonic adenoma  11/26/2009  . Sinusitis acute   . URI (upper respiratory infection)     Current Outpatient Prescriptions on File Prior to Visit  Medication Sig Dispense Refill  . Acetaminophen (TYLENOL PO) Take by mouth.    Marland Kitchen albuterol (PROAIR HFA) 108 (90 Base) MCG/ACT inhaler Inhale into the lungs every 6 (six) hours as needed for wheezing or shortness of breath.    . DULoxetine (CYMBALTA) 30 MG capsule Take 1 capsule (30 mg total) by mouth daily. 90 capsule 1  . DULoxetine (CYMBALTA) 60 MG capsule Take 1 capsule (60 mg total) by mouth daily. 90 capsule 1  . EPIPEN 2-PAK 0.3 MG/0.3ML SOAJ injection USE AS DIRECTED FOR SEVERE LIFE-THREATENING ALLERGIC REACTION. 2 Device 0  . FIBER PO Take by mouth.    . fluticasone (FLONASE) 50 MCG/ACT nasal spray USE ONE SPRAY IN EACH NOSTRIL TWICE DAILY 48 g 1  . Mometasone Furoate (ASMANEX HFA) 200 MCG/ACT AERO Inhale 2 puffs into the lungs 2 (two) times daily. 3 Inhaler 0  . montelukast (SINGULAIR) 10 MG tablet Take one tablet once daily once daily as directed 90 tablet 1  . NON FORMULARY daily. Herbal supplement: Bowel Stimulant    . omeprazole (PRILOSEC) 40 MG capsule TAKE 1 CAPSULE (40 MG TOTAL) BY MOUTH DAILY. 90 capsule 1  .  ranitidine (ZANTAC) 150 MG tablet Take 150 mg by mouth. Take on the day you receive allergy injections    . Vitamin D, Ergocalciferol, (DRISDOL) 50000 units CAPS capsule Take 1 capsule (50,000 Units total) by mouth every 7 (seven) days. 12 capsule 0  . benzonatate (TESSALON) 100 MG capsule Take 1 capsule (100 mg total) by mouth 2 (two) times daily as needed for cough. (Patient not taking: Reported on 01/13/2017) 20 capsule 0   No current facility-administered medications on file prior to visit.     Allergies  Allergen Reactions  . Amoxicillin Hives  . Clarithromycin     REACTION: hives  . Naproxen Sodium     REACTION: HIVES  . Penicillins     REACTION: hives  . Sulfa Antibiotics Hives    Family History  Problem Relation Age of Onset  . Colon cancer Sister 62  . Hypertension Mother   . Diabetes Mother   . Diabetes Brother   . Diabetes Sister   . Lung cancer Sister        was a smoker  . Emphysema Father        was a smoker    Social History   Social History  . Marital status: Married    Spouse name: N/A  . Number of children: 3  . Years of education: N/A   Occupational History  . Teacher's Aid    Social History  Main Topics  . Smoking status: Former Smoker    Packs/day: 0.50    Years: 18.00    Types: Cigarettes    Quit date: 06/28/1999  . Smokeless tobacco: Never Used  . Alcohol use 1.8 oz/week    3 Glasses of wine per week  . Drug use: No  . Sexual activity: Not Asked   Other Topics Concern  . None   Social History Narrative  . None   Review of Systems - See HPI.  All other ROS are negative.  BP 108/78   Pulse (!) 103   Temp 98.4 F (36.9 C) (Oral)   Resp 14   Ht '5\' 1"'$  (1.549 m)   Wt 162 lb (73.5 kg)   SpO2 98%   BMI 30.61 kg/m   Physical Exam  Constitutional: She is oriented to person, place, and time and well-developed, well-nourished, and in no distress.  HENT:  Head: Normocephalic and atraumatic.  Right Ear: External ear normal.  Left  Ear: External ear normal.  Nose: Nose normal.  Mouth/Throat: Oropharynx is clear and moist. No oropharyngeal exudate.  TM within normal limits bilaterally.   Eyes: Conjunctivae are normal.  Neck: Neck supple.  Cardiovascular: Normal rate, regular rhythm, normal heart sounds and intact distal pulses.   Pulmonary/Chest: Effort normal and breath sounds normal. No respiratory distress. She has no wheezes. She has no rales. She exhibits no tenderness.  Neurological: She is alert and oriented to person, place, and time.  Skin: Skin is warm and dry. No rash noted.  Psychiatric: Affect normal.  Vitals reviewed.  Recent Results (from the past 2160 hour(s))  Lipid panel     Status: Abnormal   Collection Time: 10/28/16  9:34 AM  Result Value Ref Range   Cholesterol 239 (H) 0 - 200 mg/dL    Comment: ATP III Classification       Desirable:  < 200 mg/dL               Borderline High:  200 - 239 mg/dL          High:  > = 240 mg/dL   Triglycerides 120.0 0.0 - 149.0 mg/dL    Comment: Normal:  <150 mg/dLBorderline High:  150 - 199 mg/dL   HDL 66.80 >39.00 mg/dL   VLDL 24.0 0.0 - 40.0 mg/dL   LDL Cholesterol 148 (H) 0 - 99 mg/dL   Total CHOL/HDL Ratio 4     Comment:                Men          Women1/2 Average Risk     3.4          3.3Average Risk          5.0          4.42X Average Risk          9.6          7.13X Average Risk          15.0          11.0                       NonHDL 172.37     Comment: NOTE:  Non-HDL goal should be 30 mg/dL higher than patient's LDL goal (i.e. LDL goal of < 70 mg/dL, would have non-HDL goal of < 100 mg/dL)  Basic metabolic panel     Status: None   Collection Time:  10/28/16  9:34 AM  Result Value Ref Range   Sodium 141 135 - 145 mEq/L   Potassium 4.7 3.5 - 5.1 mEq/L   Chloride 103 96 - 112 mEq/L   CO2 31 19 - 32 mEq/L   Glucose, Bld 87 70 - 99 mg/dL   BUN 11 6 - 23 mg/dL   Creatinine, Ser 0.67 0.40 - 1.20 mg/dL   Calcium 9.6 8.4 - 10.5 mg/dL   GFR 96.81 >60.00  mL/min  TSH     Status: None   Collection Time: 10/28/16  9:34 AM  Result Value Ref Range   TSH 1.43 0.35 - 4.50 uIU/mL  Hepatic function panel     Status: None   Collection Time: 10/28/16  9:34 AM  Result Value Ref Range   Total Bilirubin 0.9 0.2 - 1.2 mg/dL   Bilirubin, Direct 0.1 0.0 - 0.3 mg/dL   Alkaline Phosphatase 82 39 - 117 U/L   AST 12 0 - 37 U/L   ALT 13 0 - 35 U/L   Total Protein 6.7 6.0 - 8.3 g/dL   Albumin 4.4 3.5 - 5.2 g/dL  CBC with Differential/Platelet     Status: Abnormal   Collection Time: 10/28/16  9:34 AM  Result Value Ref Range   WBC 8.6 4.0 - 10.5 K/uL   RBC 4.82 3.87 - 5.11 Mil/uL   Hemoglobin 13.3 12.0 - 15.0 g/dL   HCT 42.0 36.0 - 46.0 %   MCV 87.1 78.0 - 100.0 fl   MCHC 31.6 30.0 - 36.0 g/dL   RDW 15.9 (H) 11.5 - 15.5 %   Platelets 471.0 (H) 150.0 - 400.0 K/uL   Neutrophils Relative % 63.4 43.0 - 77.0 %   Lymphocytes Relative 22.4 12.0 - 46.0 %   Monocytes Relative 9.9 3.0 - 12.0 %   Eosinophils Relative 3.4 0.0 - 5.0 %   Basophils Relative 0.9 0.0 - 3.0 %   Neutro Abs 5.5 1.4 - 7.7 K/uL   Lymphs Abs 1.9 0.7 - 4.0 K/uL   Monocytes Absolute 0.9 0.1 - 1.0 K/uL   Eosinophils Absolute 0.3 0.0 - 0.7 K/uL   Basophils Absolute 0.1 0.0 - 0.1 K/uL  Rheumatoid Factor     Status: None   Collection Time: 10/28/16  9:34 AM  Result Value Ref Range   Rhuematoid fact SerPl-aCnc <14 <14 IU/mL  Antinuclear Antib (ANA)     Status: None   Collection Time: 10/28/16  9:34 AM  Result Value Ref Range   Anit Nuclear Antibody(ANA) NEG NEGATIVE  VITAMIN D 25 Hydroxy (Vit-D Deficiency, Fractures)     Status: Abnormal   Collection Time: 10/28/16  9:34 AM  Result Value Ref Range   VITD 27.47 (L) 30.00 - 100.00 ng/mL  CBC with Differential/Platelet     Status: Abnormal   Collection Time: 01/10/17  3:41 PM  Result Value Ref Range   WBC 20.9 (H) 3.8 - 10.8 K/uL   RBC 5.27 (H) 3.80 - 5.10 MIL/uL   Hemoglobin 14.7 11.7 - 15.5 g/dL   HCT 45.9 (H) 35.0 - 45.0 %   MCV  87.1 80.0 - 100.0 fL   MCH 27.9 27.0 - 33.0 pg   MCHC 32.0 32.0 - 36.0 g/dL   RDW 14.7 11.0 - 15.0 %   Platelets 568 (H) 140 - 400 K/uL   MPV 9.0 7.5 - 12.5 fL   Neutro Abs 17,138 (H) 1,500 - 7,800 cells/uL   Lymphs Abs 2,299 850 - 3,900 cells/uL   Monocytes  Absolute 1,463 (H) 200 - 950 cells/uL   Eosinophils Absolute 0 (L) 15 - 500 cells/uL   Basophils Absolute 0 0 - 200 cells/uL   Neutrophils Relative % 82 %   Lymphocytes Relative 11 %   Monocytes Relative 7 %   Eosinophils Relative 0 %   Basophils Relative 0 %   Smear Review Criteria for review not met   Procalcitonin     Status: None   Collection Time: 01/10/17  3:41 PM  Result Value Ref Range   Procalcitonin <0.10 <0.10 ng/mL    Comment:   <= 0.5 ng/mL: Systemic infection (sepsis) is not likely. Local bacterial infection is possible.   > 0.5 ng/mL and <= 2 ng/mL: Systemic infection (sepsis) is possible, but other conditions are known to elevate PCT as well.   > 2 ng/mL: Systemic infection (sepsis) is likely, unless other causes are known.   >= 10 ng/mL: Important systemic inflammatory response, almost exclusively due to severe bacterial sepsis or septic shock. Interpretation Guidelines Diagnosis of systemic bacterial infection/sepsis            <0.50 ng/mL: Low risk for sepsis; local                         bacterial infection possible  >=0.50 to <2.00 ng/mL: Sepsis is possible; other                         conditions possible >=2.00 to <10.00 ng/mL: Sepsis likely          >=10.00 ng/mL: Severe bacterial sepsis or                         septic shock probable Diagnosis of lower respiratory tract infections:           <0.10 ng/mL:  Bacterial infection very                         unlikely >=0.10  to <0.25 ng/mL:  Bacterial infection unlikely >=0.25 to <0.50 ng/mL:  Bacterial infection likely          >=0.50 ng/mL:  Bacterial infection very                         likely Procalcitonin levels should be evaluated  in context of all laboratory findings and the total clinical status of the patient. For more information on this test, go to http://education.[facility].com/faq/FAQ46     Assessment/Plan: 1. Respiratory tract infection Acute bacterial bronchitis as CXR negative for pneumonia. Patient has remained afebrile since starting antibiotic. Notes improved energy and waning symptoms. Will finish entire course of antibiotic. Will switch Asmanex to [facility] short term to help with chest tightness as we want to avoid further use of systemic steroids. Follow-up scheduled. Will reassess CBC at that time. ER if anything worsens.    Leeanne Rio, PA-C

## 2017-01-13 NOTE — Patient Instructions (Signed)
Please keep well hydrated. Get plenty of rest. I am glad you are feeling better. Please finish the entire course of the antibiotic.   Continue Mucinex for congestion. Stop the Asmanex and start the [facility] as directed. We will have a final follow-up on Monday or Tuesday of next week. We will recheck labs at that time.   If anything worsens over the weekend, please go to the ER.

## 2017-01-16 ENCOUNTER — Other Ambulatory Visit: Payer: Self-pay | Admitting: Family Medicine

## 2017-01-17 ENCOUNTER — Ambulatory Visit (INDEPENDENT_AMBULATORY_CARE_PROVIDER_SITE_OTHER): Payer: BLUE CROSS/BLUE SHIELD | Admitting: Physician Assistant

## 2017-01-17 ENCOUNTER — Encounter: Payer: Self-pay | Admitting: Physician Assistant

## 2017-01-17 VITALS — BP 100/70 | HR 83 | Temp 98.7°F | Resp 14 | Ht 61.0 in | Wt 162.0 lb

## 2017-01-17 DIAGNOSIS — B9689 Other specified bacterial agents as the cause of diseases classified elsewhere: Secondary | ICD-10-CM | POA: Diagnosis not present

## 2017-01-17 DIAGNOSIS — D72829 Elevated white blood cell count, unspecified: Secondary | ICD-10-CM

## 2017-01-17 DIAGNOSIS — J208 Acute bronchitis due to other specified organisms: Secondary | ICD-10-CM

## 2017-01-17 LAB — CBC WITH DIFFERENTIAL/PLATELET
Basophils Absolute: 0.1 10*3/uL (ref 0.0–0.1)
Basophils Relative: 0.6 % (ref 0.0–3.0)
Eosinophils Absolute: 0.2 10*3/uL (ref 0.0–0.7)
Eosinophils Relative: 1.9 % (ref 0.0–5.0)
HEMATOCRIT: 39.7 % (ref 36.0–46.0)
Hemoglobin: 12.9 g/dL (ref 12.0–15.0)
LYMPHS ABS: 1.5 10*3/uL (ref 0.7–4.0)
LYMPHS PCT: 15.8 % (ref 12.0–46.0)
MCHC: 32.5 g/dL (ref 30.0–36.0)
MCV: 86.7 fl (ref 78.0–100.0)
MONOS PCT: 10.5 % (ref 3.0–12.0)
Monocytes Absolute: 1 10*3/uL (ref 0.1–1.0)
NEUTROS PCT: 71.2 % (ref 43.0–77.0)
Neutro Abs: 6.6 10*3/uL (ref 1.4–7.7)
Platelets: 429 10*3/uL — ABNORMAL HIGH (ref 150.0–400.0)
RBC: 4.58 Mil/uL (ref 3.87–5.11)
RDW: 15.3 % (ref 11.5–15.5)
WBC: 9.2 10*3/uL (ref 4.0–10.5)

## 2017-01-17 MED ORDER — OMEPRAZOLE 40 MG PO CPDR: DELAYED_RELEASE_CAPSULE | ORAL | 1 refills | Status: DC

## 2017-01-17 NOTE — Progress Notes (Signed)
Patient presents to clinic today for follow-up of acute bacterial bronchitis with bronchospasm and asthma exacerbation. Patient has finished entire course of antibiotic since last visit. Is taking Dulera instead of Asmanex as directed at last visit. Notes feeling markedly better. Denies any chest congestion or residual cough. Denies fever, chills. Denies new symptoms. Still some mild fatigue but is improving daily.    Past Medical History:  Diagnosis Date  . Allergy   . Asthma   . Depression   . Early menopause   . Hx of adenomatous polyp of colon 01/11/2016  . Hyperlipidemia   . Non-allergic rhinitis   . Obesity   . Osteoarthritis   . Personal history of colonic adenoma  11/26/2009  . Sinusitis acute   . URI (upper respiratory infection)     Current Outpatient Prescriptions on File Prior to Visit  Medication Sig Dispense Refill  . Acetaminophen (TYLENOL PO) Take by mouth.    Marland Kitchen albuterol (PROAIR HFA) 108 (90 Base) MCG/ACT inhaler Inhale into the lungs every 6 (six) hours as needed for wheezing or shortness of breath.    . benzonatate (TESSALON) 100 MG capsule Take 1 capsule (100 mg total) by mouth 2 (two) times daily as needed for cough. (Patient not taking: Reported on 01/13/2017) 20 capsule 0  . DULoxetine (CYMBALTA) 30 MG capsule Take 1 capsule (30 mg total) by mouth daily. 90 capsule 1  . DULoxetine (CYMBALTA) 60 MG capsule Take 1 capsule (60 mg total) by mouth daily. 90 capsule 1  . EPIPEN 2-PAK 0.3 MG/0.3ML SOAJ injection USE AS DIRECTED FOR SEVERE LIFE-THREATENING ALLERGIC REACTION. 2 Device 0  . FIBER PO Take by mouth.    . fluticasone (FLONASE) 50 MCG/ACT nasal spray USE ONE SPRAY IN EACH NOSTRIL TWICE DAILY 48 g 1  . Mometasone Furoate (ASMANEX HFA) 200 MCG/ACT AERO Inhale 2 puffs into the lungs 2 (two) times daily. 3 Inhaler 0  . mometasone-formoterol (DULERA) 200-5 MCG/ACT AERO Inhale 2 puffs into the lungs 2 (two) times daily. 8.8 g 1  . montelukast (SINGULAIR) 10 MG  tablet Take one tablet once daily once daily as directed 90 tablet 1  . NON FORMULARY daily. Herbal supplement: Bowel Stimulant    . omeprazole (PRILOSEC) 40 MG capsule TAKE 1 CAPSULE (40 MG TOTAL) BY MOUTH DAILY. 90 capsule 1  . ranitidine (ZANTAC) 150 MG tablet Take 150 mg by mouth. Take on the day you receive allergy injections    . Vitamin D, Ergocalciferol, (DRISDOL) 50000 units CAPS capsule Take 1 capsule (50,000 Units total) by mouth every 7 (seven) days. 12 capsule 0   No current facility-administered medications on file prior to visit.     Allergies  Allergen Reactions  . Amoxicillin Hives  . Clarithromycin     REACTION: hives  . Naproxen Sodium     REACTION: HIVES  . Penicillins     REACTION: hives  . Sulfa Antibiotics Hives    Family History  Problem Relation Age of Onset  . Colon cancer Sister 29  . Hypertension Mother   . Diabetes Mother   . Diabetes Brother   . Diabetes Sister   . Lung cancer Sister        was a smoker  . Emphysema Father        was a smoker    Social History   Social History  . Marital status: Married    Spouse name: N/A  . Number of children: 3  . Years of education:  N/A   Occupational History  . Teacher's Aid    Social History Main Topics  . Smoking status: Former Smoker    Packs/day: 0.50    Years: 18.00    Types: Cigarettes    Quit date: 06/28/1999  . Smokeless tobacco: Never Used  . Alcohol use 1.8 oz/week    3 Glasses of wine per week  . Drug use: No  . Sexual activity: Not on file   Other Topics Concern  . Not on file   Social History Narrative  . No narrative on file    Review of Systems - See HPI.  All other ROS are negative.  There were no vitals taken for this visit.  Physical Exam  Constitutional: She is oriented to person, place, and time and well-developed, well-nourished, and in no distress.  HENT:  Head: Normocephalic and atraumatic.  Eyes: Conjunctivae are normal.  Neck: Neck supple.    Cardiovascular: Normal rate, regular rhythm, normal heart sounds and intact distal pulses.   Pulmonary/Chest: Effort normal and breath sounds normal. No respiratory distress. She has no wheezes. She has no rales. She exhibits no tenderness.  Neurological: She is alert and oriented to person, place, and time.  Skin: Skin is warm and dry. No rash noted.  Psychiatric: Affect normal.  Vitals reviewed.   Recent Results (from the past 2160 hour(s))  Lipid panel     Status: Abnormal   Collection Time: 10/28/16  9:34 AM  Result Value Ref Range   Cholesterol 239 (H) 0 - 200 mg/dL    Comment: ATP III Classification       Desirable:  < 200 mg/dL               Borderline High:  200 - 239 mg/dL          High:  > = 240 mg/dL   Triglycerides 120.0 0.0 - 149.0 mg/dL    Comment: Normal:  <150 mg/dLBorderline High:  150 - 199 mg/dL   HDL 66.80 >39.00 mg/dL   VLDL 24.0 0.0 - 40.0 mg/dL   LDL Cholesterol 148 (H) 0 - 99 mg/dL   Total CHOL/HDL Ratio 4     Comment:                Men          Women1/2 Average Risk     3.4          3.3Average Risk          5.0          4.42X Average Risk          9.6          7.13X Average Risk          15.0          11.0                       NonHDL 172.37     Comment: NOTE:  Non-HDL goal should be 30 mg/dL higher than patient's LDL goal (i.e. LDL goal of < 70 mg/dL, would have non-HDL goal of < 100 mg/dL)  Basic metabolic panel     Status: None   Collection Time: 10/28/16  9:34 AM  Result Value Ref Range   Sodium 141 135 - 145 mEq/L   Potassium 4.7 3.5 - 5.1 mEq/L   Chloride 103 96 - 112 mEq/L   CO2 31 19 - 32 mEq/L   Glucose, Bld  87 70 - 99 mg/dL   BUN 11 6 - 23 mg/dL   Creatinine, Ser 0.67 0.40 - 1.20 mg/dL   Calcium 9.6 8.4 - 10.5 mg/dL   GFR 96.81 >60.00 mL/min  TSH     Status: None   Collection Time: 10/28/16  9:34 AM  Result Value Ref Range   TSH 1.43 0.35 - 4.50 uIU/mL  Hepatic function panel     Status: None   Collection Time: 10/28/16  9:34 AM  Result  Value Ref Range   Total Bilirubin 0.9 0.2 - 1.2 mg/dL   Bilirubin, Direct 0.1 0.0 - 0.3 mg/dL   Alkaline Phosphatase 82 39 - 117 U/L   AST 12 0 - 37 U/L   ALT 13 0 - 35 U/L   Total Protein 6.7 6.0 - 8.3 g/dL   Albumin 4.4 3.5 - 5.2 g/dL  CBC with Differential/Platelet     Status: Abnormal   Collection Time: 10/28/16  9:34 AM  Result Value Ref Range   WBC 8.6 4.0 - 10.5 K/uL   RBC 4.82 3.87 - 5.11 Mil/uL   Hemoglobin 13.3 12.0 - 15.0 g/dL   HCT 42.0 36.0 - 46.0 %   MCV 87.1 78.0 - 100.0 fl   MCHC 31.6 30.0 - 36.0 g/dL   RDW 15.9 (H) 11.5 - 15.5 %   Platelets 471.0 (H) 150.0 - 400.0 K/uL   Neutrophils Relative % 63.4 43.0 - 77.0 %   Lymphocytes Relative 22.4 12.0 - 46.0 %   Monocytes Relative 9.9 3.0 - 12.0 %   Eosinophils Relative 3.4 0.0 - 5.0 %   Basophils Relative 0.9 0.0 - 3.0 %   Neutro Abs 5.5 1.4 - 7.7 K/uL   Lymphs Abs 1.9 0.7 - 4.0 K/uL   Monocytes Absolute 0.9 0.1 - 1.0 K/uL   Eosinophils Absolute 0.3 0.0 - 0.7 K/uL   Basophils Absolute 0.1 0.0 - 0.1 K/uL  Rheumatoid Factor     Status: None   Collection Time: 10/28/16  9:34 AM  Result Value Ref Range   Rhuematoid fact SerPl-aCnc <14 <14 IU/mL  Antinuclear Antib (ANA)     Status: None   Collection Time: 10/28/16  9:34 AM  Result Value Ref Range   Anit Nuclear Antibody(ANA) NEG NEGATIVE  VITAMIN D 25 Hydroxy (Vit-D Deficiency, Fractures)     Status: Abnormal   Collection Time: 10/28/16  9:34 AM  Result Value Ref Range   VITD 27.47 (L) 30.00 - 100.00 ng/mL  CBC with Differential/Platelet     Status: Abnormal   Collection Time: 01/10/17  3:41 PM  Result Value Ref Range   WBC 20.9 (H) 3.8 - 10.8 K/uL   RBC 5.27 (H) 3.80 - 5.10 MIL/uL   Hemoglobin 14.7 11.7 - 15.5 g/dL   HCT 45.9 (H) 35.0 - 45.0 %   MCV 87.1 80.0 - 100.0 fL   MCH 27.9 27.0 - 33.0 pg   MCHC 32.0 32.0 - 36.0 g/dL   RDW 14.7 11.0 - 15.0 %   Platelets 568 (H) 140 - 400 K/uL   MPV 9.0 7.5 - 12.5 fL   Neutro Abs 17,138 (H) 1,500 - 7,800 cells/uL    Lymphs Abs 2,299 850 - 3,900 cells/uL   Monocytes Absolute 1,463 (H) 200 - 950 cells/uL   Eosinophils Absolute 0 (L) 15 - 500 cells/uL   Basophils Absolute 0 0 - 200 cells/uL   Neutrophils Relative % 82 %   Lymphocytes Relative 11 %   Monocytes Relative 7 %  Eosinophils Relative 0 %   Basophils Relative 0 %   Smear Review Criteria for review not met   Procalcitonin     Status: None   Collection Time: 01/10/17  3:41 PM  Result Value Ref Range   Procalcitonin <0.10 <0.10 ng/mL    Comment:   <= 0.5 ng/mL: Systemic infection (sepsis) is not likely. Local bacterial infection is possible.   > 0.5 ng/mL and <= 2 ng/mL: Systemic infection (sepsis) is possible, but other conditions are known to elevate PCT as well.   > 2 ng/mL: Systemic infection (sepsis) is likely, unless other causes are known.   >= 10 ng/mL: Important systemic inflammatory response, almost exclusively due to severe bacterial sepsis or septic shock. Interpretation Guidelines Diagnosis of systemic bacterial infection/sepsis            <0.50 ng/mL: Low risk for sepsis; local                         bacterial infection possible  >=0.50 to <2.00 ng/mL: Sepsis is possible; other                         conditions possible >=2.00 to <10.00 ng/mL: Sepsis likely          >=10.00 ng/mL: Severe bacterial sepsis or                         septic shock probable Diagnosis of lower respiratory tract infections:           <0.10 ng/mL:  Bacterial infection very                         unlikely >=0.10  to <0.25 ng/mL:  Bacterial infection unlikely >=0.25 to <0.50 ng/mL:  Bacterial infection likely          >=0.50 ng/mL:  Bacterial infection very                         likely Procalcitonin levels should be evaluated in context of all laboratory findings and the total clinical status of the patient. For more information on this test, go to http://education.[facility].com/faq/FAQ46     Assessment/Plan: 1. Acute  bacterial bronchitis Clinically resolved. Continue supportive measures. Can wean back to Asmanex from [facility] over the next week or so. Return precautions reviewed with patient.   2. Leukocytosis, unspecified type Should be resolving/resolved with cessation of steroid and treatment of infection. Repeat labs today.  - CBC w/Diff   Leeanne Rio, PA-C

## 2017-01-17 NOTE — Progress Notes (Signed)
Pre visit review using our clinic review tool, if applicable. No additional management support is needed unless otherwise documented below in the visit note. 

## 2017-01-17 NOTE — Patient Instructions (Signed)
I am glad you are doing much better! Keep hydrated and take a daily multivitamin.  Once things are completely resolved, stop the [facility] and resume the Asmanex you were taking previously. We want control of symptoms with the least amount of medication possible.   Please follow-up with Dr. Birdie Riddle as scheduled.  I will call you with your lab results.

## 2017-01-20 ENCOUNTER — Ambulatory Visit (INDEPENDENT_AMBULATORY_CARE_PROVIDER_SITE_OTHER): Payer: BLUE CROSS/BLUE SHIELD | Admitting: *Deleted

## 2017-01-20 DIAGNOSIS — J309 Allergic rhinitis, unspecified: Secondary | ICD-10-CM

## 2017-01-27 ENCOUNTER — Ambulatory Visit (INDEPENDENT_AMBULATORY_CARE_PROVIDER_SITE_OTHER): Payer: BLUE CROSS/BLUE SHIELD | Admitting: *Deleted

## 2017-01-27 DIAGNOSIS — J309 Allergic rhinitis, unspecified: Secondary | ICD-10-CM

## 2017-02-03 ENCOUNTER — Ambulatory Visit (INDEPENDENT_AMBULATORY_CARE_PROVIDER_SITE_OTHER): Payer: BLUE CROSS/BLUE SHIELD

## 2017-02-03 DIAGNOSIS — J309 Allergic rhinitis, unspecified: Secondary | ICD-10-CM | POA: Diagnosis not present

## 2017-02-10 ENCOUNTER — Encounter: Payer: Self-pay | Admitting: General Practice

## 2017-02-13 ENCOUNTER — Encounter: Payer: Self-pay | Admitting: Allergy and Immunology

## 2017-02-13 ENCOUNTER — Ambulatory Visit (INDEPENDENT_AMBULATORY_CARE_PROVIDER_SITE_OTHER): Payer: BLUE CROSS/BLUE SHIELD | Admitting: Allergy and Immunology

## 2017-02-13 VITALS — BP 130/84 | HR 72 | Resp 16

## 2017-02-13 DIAGNOSIS — J309 Allergic rhinitis, unspecified: Secondary | ICD-10-CM

## 2017-02-13 DIAGNOSIS — T781XXD Other adverse food reactions, not elsewhere classified, subsequent encounter: Secondary | ICD-10-CM | POA: Diagnosis not present

## 2017-02-13 DIAGNOSIS — J4540 Moderate persistent asthma, uncomplicated: Secondary | ICD-10-CM | POA: Diagnosis not present

## 2017-02-13 MED ORDER — MONTELUKAST SODIUM 10 MG PO TABS: ORAL_TABLET | ORAL | 1 refills | Status: DC

## 2017-02-13 NOTE — Patient Instructions (Addendum)
  1. Continue Immunotherapy and Epi-Pen  2. Continue Flonase 1-2 sprays each nostril 3-7 times per week   3. Continue Asmanex 200 HFA two inhalations two times per day.   4. Continue Montelukast 10mg  one tablet one time per day  5. Continue claritin / zyrtec, nasal saline if needed  6. Continue Proair HFA if needed  7. Obtain fall flu vaccine  8. Return in 6 months or earlier if problem

## 2017-02-13 NOTE — Progress Notes (Signed)
Follow-up Note  Referring Provider: Midge Minium, MD Primary Provider: Midge Minium, MD Date of Office Visit: 02/13/2017  Subjective:   Tina Williams (DOB: 1960-07-02) is a 56 y.o. female who returns to the [facility] and [place] on 02/13/2017 in re-evaluation of the following:  HPI: Francheska presents to this clinic in reevaluation of her asthma and allergic rhinitis and oral pollinosis syndrome. Her last visit to this clinic was 09/08/2016.  She is using immunotherapy presently at every week. She has had some problems accelerating the dosage because of large local reactions while using Zyrtec one time per day. Otherwise, she has not had any adverse effect from this treatment.  This spring her oral pollinosis syndrome did become somewhat active with lots of mouth and throat itching with consumption of fruit and vegetables. Since the spring has resolved she can now eat these food substances without any problem.  Asthma has not been a particularly big issue and she does not use a short acting bronchodilator and she can exert herself without any problem. Apparently in July she developed an episode of otitis media and sinusitis which did result in some coughing and shortness of breath for which she was treated with 2 antibiotics and a course of steroids. It should be noted that her husband had a very similar issue around the same point in time suggesting that this was a viral infection. She still has a little bit of ear fullness although she does not have any tinnitus or vertigo and her hearing is intact.  Allergies as of 02/13/2017      Reactions   Amoxicillin Hives   Clarithromycin    REACTION: hives   Naproxen Sodium    REACTION: HIVES   Penicillins    REACTION: hives   Sulfa Antibiotics Hives      Medication List      DULoxetine 60 MG capsule Commonly known as:  CYMBALTA Take 1 capsule (60 mg total) by mouth daily.   DULoxetine 30 MG capsule Commonly known  as:  CYMBALTA Take 1 capsule (30 mg total) by mouth daily.   EPIPEN 2-PAK 0.3 mg/0.3 mL Soaj injection Generic drug:  EPINEPHrine USE AS DIRECTED FOR SEVERE LIFE-THREATENING ALLERGIC REACTION.   FIBER PO Take by mouth.   fluticasone 50 MCG/ACT nasal spray Commonly known as:  FLONASE USE ONE SPRAY IN EACH NOSTRIL TWICE DAILY   Mometasone Furoate 200 MCG/ACT Aero Commonly known as:  ASMANEX HFA Inhale 2 puffs into the lungs 2 (two) times daily.   montelukast 10 MG tablet Commonly known as:  SINGULAIR Take one tablet once daily once daily as directed   NON FORMULARY daily. Herbal supplement: Bowel Stimulant   omeprazole 40 MG capsule Commonly known as:  PRILOSEC TAKE 1 CAPSULE (40 MG TOTAL) BY MOUTH DAILY.   PROAIR HFA 108 (90 Base) MCG/ACT inhaler Generic drug:  albuterol Inhale into the lungs every 6 (six) hours as needed for wheezing or shortness of breath.   ranitidine 150 MG tablet Commonly known as:  ZANTAC Take 150 mg by mouth. Take on the day you receive allergy injections   TYLENOL PO Take by mouth.   Vitamin D (Ergocalciferol) 50000 units Caps capsule Commonly known as:  DRISDOL Take 1 capsule (50,000 Units total) by mouth every 7 (seven) days.       Past Medical History:  Diagnosis Date  . Allergy   . Asthma   . Depression   . Early menopause   . Hx  of adenomatous polyp of colon 01/11/2016  . Hyperlipidemia   . Non-allergic rhinitis   . Obesity   . Osteoarthritis   . Personal history of colonic adenoma  11/26/2009  . Sinusitis acute   . URI (upper respiratory infection)     Past Surgical History:  Procedure Laterality Date  . CESAREAN SECTION  1988  . KNEE SURGERY Right 1998  . WISDOM TOOTH EXTRACTION  1984    Review of systems negative except as noted in HPI / PMHx or noted below:  Review of Systems  Constitutional: Negative.   HENT: Negative.   Eyes: Negative.   Respiratory: Negative.   Cardiovascular: Negative.   Gastrointestinal:  Negative.   Genitourinary: Negative.   Musculoskeletal: Negative.   Skin: Negative.   Neurological: Negative.   Endo/Heme/Allergies: Negative.   Psychiatric/Behavioral: Negative.      Objective:   Vitals:   02/13/17 1027  BP: 130/84  Pulse: 72  Resp: 16          Physical Exam  Constitutional: She is well-developed, well-nourished, and in no distress.  HENT:  Head: Normocephalic.  Right Ear: Tympanic membrane, external ear and ear canal normal.  Left Ear: Tympanic membrane, external ear and ear canal normal.  Nose: Nose normal. No mucosal edema or rhinorrhea.  Mouth/Throat: Uvula is midline, oropharynx is clear and moist and mucous membranes are normal. No oropharyngeal exudate.  Eyes: Conjunctivae are normal.  Neck: Trachea normal. No tracheal tenderness present. No tracheal deviation present. No thyromegaly present.  Cardiovascular: Normal rate, regular rhythm, S1 normal, S2 normal and normal heart sounds.   No murmur heard. Pulmonary/Chest: Breath sounds normal. No stridor. No respiratory distress. She has no wheezes. She has no rales.  Musculoskeletal: She exhibits no edema.  Lymphadenopathy:       Head (right side): No tonsillar adenopathy present.       Head (left side): No tonsillar adenopathy present.    She has no cervical adenopathy.  Neurological: She is alert. Gait normal.  Skin: No rash noted. She is not diaphoretic. No erythema. Nails show no clubbing.  Psychiatric: Mood and affect normal.    Diagnostics: Results of a chest x-ray obtained 01/08/2017 identified a nodular density noted over the soft tissues external to the bony thorax on the PA radiograph likely representing calcified lymph node in the right axilla. Otherwise, chest x-ray was normal.   Spirometry was performed and demonstrated an FEV1 of 1.96 at 82 % of predicted.  The patient had an Asthma Control Test with the following results: ACT Total Score: 21.    Assessment and Plan:   1.  Moderate persistent asthma without complication   2. Allergic rhinitis, unspecified seasonality, unspecified trigger   3. Oral allergy syndrome, subsequent encounter     1. Continue Immunotherapy and Epi-Pen  2. Continue Flonase 1-2 sprays each nostril 3-7 times per week   3. Continue Asmanex 200 HFA two inhalations two times per day.   4. Continue Montelukast 10mg  one tablet one time per day  5. Continue claritin / zyrtec, nasal saline if needed  6. Continue Proair HFA if needed  7. Obtain fall flu vaccine  8. Return in 6 months or earlier if problem  Shanyah appears to be doing relatively well on her current plan and she will continue immunotherapy and anti-inflammatory agents for her respiratory tract as noted above. I have encouraged her to use a combination of Zyrtec and Claritin on the day of her immunotherapy to prevent large local  reactions. I will see her back in this clinic in approximately 6 months or earlier if there is a problem.  Allena Katz, MD Allergy / Immunology [place]

## 2017-02-20 ENCOUNTER — Ambulatory Visit (INDEPENDENT_AMBULATORY_CARE_PROVIDER_SITE_OTHER): Payer: BLUE CROSS/BLUE SHIELD | Admitting: *Deleted

## 2017-02-20 DIAGNOSIS — J309 Allergic rhinitis, unspecified: Secondary | ICD-10-CM | POA: Diagnosis not present

## 2017-03-01 ENCOUNTER — Encounter: Payer: Self-pay | Admitting: Physician Assistant

## 2017-03-01 ENCOUNTER — Ambulatory Visit (INDEPENDENT_AMBULATORY_CARE_PROVIDER_SITE_OTHER): Payer: BLUE CROSS/BLUE SHIELD | Admitting: Physician Assistant

## 2017-03-01 VITALS — BP 118/80 | HR 81 | Temp 98.5°F | Resp 14 | Ht 61.0 in | Wt 168.0 lb

## 2017-03-01 DIAGNOSIS — H6981 Other specified disorders of Eustachian tube, right ear: Secondary | ICD-10-CM | POA: Diagnosis not present

## 2017-03-01 MED ORDER — LORATADINE-PSEUDOEPHEDRINE ER 5-120 MG PO TB12: 1.0000 | ORAL_TABLET | Freq: Two times a day (BID) | ORAL | 0 refills | Status: DC

## 2017-03-01 NOTE — Progress Notes (Signed)
Patient presents to clinic today c/o R otalgia over the past few months.  Patient has been dealing with ear pressure and chronic allergy symptoms since June of this year after a trip to [facility]. Was evaluated by Allergist 02/13/17 and was told she had a clogged Eustachian Tube. Has been using Flonase one spray in each nostril once daily. Notes some decreased hearing, tinnitus. Denies blood coming from the ear. Denies symptoms of L ear. Denies fever, chills, malaise, fatigue or other URI symptoms.  Past Medical History:  Diagnosis Date  . Allergy   . Asthma   . Depression   . Early menopause   . Hx of adenomatous polyp of colon 01/11/2016  . Hyperlipidemia   . Non-allergic rhinitis   . Obesity   . Osteoarthritis   . Personal history of colonic adenoma  11/26/2009  . Sinusitis acute   . URI (upper respiratory infection)     Current Outpatient Prescriptions on File Prior to Visit  Medication Sig Dispense Refill  . Acetaminophen (TYLENOL PO) Take by mouth.    Marland Kitchen albuterol (PROAIR HFA) 108 (90 Base) MCG/ACT inhaler Inhale into the lungs every 6 (six) hours as needed for wheezing or shortness of breath.    . DULoxetine (CYMBALTA) 30 MG capsule Take 1 capsule (30 mg total) by mouth daily. 90 capsule 1  . DULoxetine (CYMBALTA) 60 MG capsule Take 1 capsule (60 mg total) by mouth daily. 90 capsule 1  . EPIPEN 2-PAK 0.3 MG/0.3ML SOAJ injection USE AS DIRECTED FOR SEVERE LIFE-THREATENING ALLERGIC REACTION. 2 Device 0  . FIBER PO Take by mouth.    . fluticasone (FLONASE) 50 MCG/ACT nasal spray USE ONE SPRAY IN EACH NOSTRIL TWICE DAILY 48 g 1  . Mometasone Furoate (ASMANEX HFA) 200 MCG/ACT AERO Inhale 2 puffs into the lungs 2 (two) times daily. 3 Inhaler 0  . montelukast (SINGULAIR) 10 MG tablet Take one tablet once daily once daily as directed 90 tablet 1  . NON FORMULARY daily. Herbal supplement: Bowel Stimulant    . omeprazole (PRILOSEC) 40 MG capsule TAKE 1 CAPSULE (40 MG TOTAL) BY MOUTH DAILY. 90  capsule 1   No current facility-administered medications on file prior to visit.     Allergies  Allergen Reactions  . Amoxicillin Hives  . Clarithromycin     REACTION: hives  . Naproxen Sodium     REACTION: HIVES  . Penicillins     REACTION: hives  . Sulfa Antibiotics Hives    Family History  Problem Relation Age of Onset  . Colon cancer Sister 44  . Hypertension Mother   . Diabetes Mother   . Diabetes Brother   . Diabetes Sister   . Lung cancer Sister        was a smoker  . Emphysema Father        was a smoker    Social History   Social History  . Marital status: Married    Spouse name: N/A  . Number of children: 3  . Years of education: N/A   Occupational History  . Teacher's Aid    Social History Main Topics  . Smoking status: Former Smoker    Packs/day: 0.50    Years: 18.00    Types: Cigarettes    Quit date: 06/28/1999  . Smokeless tobacco: Never Used  . Alcohol use 1.8 oz/week    3 Glasses of wine per week  . Drug use: No  . Sexual activity: Not Asked   Other Topics  Concern  . None   Social History Narrative  . None   Review of Systems - See HPI.  All other ROS are negative.  There were no vitals taken for this visit.  Physical Exam  Constitutional: She is oriented to person, place, and time and well-developed, well-nourished, and in no distress.  HENT:  Head: Normocephalic and atraumatic.  Right Ear: Tympanic membrane normal.  Left Ear: Tympanic membrane normal.  Eyes: Conjunctivae are normal.  Neck: Neck supple.  Cardiovascular: Normal rate, regular rhythm, normal heart sounds and intact distal pulses.   Pulmonary/Chest: Effort normal and breath sounds normal. No respiratory distress. She has no wheezes. She has no rales. She exhibits no tenderness.  Neurological: She is alert and oriented to person, place, and time.  Skin: Skin is warm and dry. No rash noted.  Psychiatric: Affect normal.  Vitals reviewed.   Recent Results (from the  past 2160 hour(s))  CBC with Differential/Platelet     Status: Abnormal   Collection Time: 01/10/17  3:41 PM  Result Value Ref Range   WBC 20.9 (H) 3.8 - 10.8 K/uL   RBC 5.27 (H) 3.80 - 5.10 MIL/uL   Hemoglobin 14.7 11.7 - 15.5 g/dL   HCT 45.9 (H) 35.0 - 45.0 %   MCV 87.1 80.0 - 100.0 fL   MCH 27.9 27.0 - 33.0 pg   MCHC 32.0 32.0 - 36.0 g/dL   RDW 14.7 11.0 - 15.0 %   Platelets 568 (H) 140 - 400 K/uL   MPV 9.0 7.5 - 12.5 fL   Neutro Abs 17,138 (H) 1,500 - 7,800 cells/uL   Lymphs Abs 2,299 850 - 3,900 cells/uL   Monocytes Absolute 1,463 (H) 200 - 950 cells/uL   Eosinophils Absolute 0 (L) 15 - 500 cells/uL   Basophils Absolute 0 0 - 200 cells/uL   Neutrophils Relative % 82 %   Lymphocytes Relative 11 %   Monocytes Relative 7 %   Eosinophils Relative 0 %   Basophils Relative 0 %   Smear Review Criteria for review not met   Procalcitonin     Status: None   Collection Time: 01/10/17  3:41 PM  Result Value Ref Range   Procalcitonin <0.10 <0.10 ng/mL    Comment:   <= 0.5 ng/mL: Systemic infection (sepsis) is not likely. Local bacterial infection is possible.   > 0.5 ng/mL and <= 2 ng/mL: Systemic infection (sepsis) is possible, but other conditions are known to elevate PCT as well.   > 2 ng/mL: Systemic infection (sepsis) is likely, unless other causes are known.   >= 10 ng/mL: Important systemic inflammatory response, almost exclusively due to severe bacterial sepsis or septic shock. Interpretation Guidelines Diagnosis of systemic bacterial infection/sepsis            <0.50 ng/mL: Low risk for sepsis; local                         bacterial infection possible  >=0.50 to <2.00 ng/mL: Sepsis is possible; other                         conditions possible >=2.00 to <10.00 ng/mL: Sepsis likely          >=10.00 ng/mL: Severe bacterial sepsis or                         septic shock probable Diagnosis of lower respiratory tract  infections:           <0.10 ng/mL:  Bacterial  infection very                         unlikely >=0.10  to <0.25 ng/mL:  Bacterial infection unlikely >=0.25 to <0.50 ng/mL:  Bacterial infection likely          >=0.50 ng/mL:  Bacterial infection very                         likely Procalcitonin levels should be evaluated in context of all laboratory findings and the total clinical status of the patient. For more information on this test, go to http://education.quest[facility].com/faq/FAQ46   CBC w/Diff     Status: Abnormal   Collection Time: 01/17/17  1:38 PM  Result Value Ref Range   WBC 9.2 4.0 - 10.5 K/uL   RBC 4.58 3.87 - 5.11 Mil/uL   Hemoglobin 12.9 12.0 - 15.0 g/dL   HCT 39.7 36.0 - 46.0 %   MCV 86.7 78.0 - 100.0 fl   MCHC 32.5 30.0 - 36.0 g/dL   RDW 15.3 11.5 - 15.5 %   Platelets 429.0 (H) 150.0 - 400.0 K/uL   Neutrophils Relative % 71.2 43.0 - 77.0 %   Lymphocytes Relative 15.8 12.0 - 46.0 %   Monocytes Relative 10.5 3.0 - 12.0 %   Eosinophils Relative 1.9 0.0 - 5.0 %   Basophils Relative 0.6 0.0 - 3.0 %   Neutro Abs 6.6 1.4 - 7.7 K/uL   Lymphs Abs 1.5 0.7 - 4.0 K/uL   Monocytes Absolute 1.0 0.1 - 1.0 K/uL   Eosinophils Absolute 0.2 0.0 - 0.7 K/uL   Basophils Absolute 0.1 0.0 - 0.1 K/uL    Assessment/Plan: 1. Eustachian tube dysfunction, right No sign of AOM or otitis externa. TM intact. Serous fluid noted behind TM. Continue Flonase. Start Claritin-D. Start saline nasal rinse. Supportive measures reviewed. If not improving, will need ENT evaluation.   Leeanne Rio, PA-C

## 2017-03-01 NOTE — Progress Notes (Signed)
Pre visit review using our clinic review tool, if applicable. No additional management support is needed unless otherwise documented below in the visit note. 

## 2017-03-01 NOTE — Patient Instructions (Signed)
Please continue your Flonase. Stop the zyrtec for now.  Start the Claritin D as directed. Get some saline nasal rinse and flush out nasal passages. Tylenol for pain.   If not improving, we will need to set you up with an ENT specialist.

## 2017-03-02 ENCOUNTER — Ambulatory Visit: Payer: BLUE CROSS/BLUE SHIELD | Admitting: Family Medicine

## 2017-03-03 ENCOUNTER — Ambulatory Visit (INDEPENDENT_AMBULATORY_CARE_PROVIDER_SITE_OTHER): Payer: BLUE CROSS/BLUE SHIELD

## 2017-03-03 DIAGNOSIS — J309 Allergic rhinitis, unspecified: Secondary | ICD-10-CM

## 2017-03-09 ENCOUNTER — Ambulatory Visit (INDEPENDENT_AMBULATORY_CARE_PROVIDER_SITE_OTHER): Payer: BLUE CROSS/BLUE SHIELD | Admitting: *Deleted

## 2017-03-09 DIAGNOSIS — J309 Allergic rhinitis, unspecified: Secondary | ICD-10-CM | POA: Diagnosis not present

## 2017-03-16 ENCOUNTER — Ambulatory Visit (INDEPENDENT_AMBULATORY_CARE_PROVIDER_SITE_OTHER): Payer: BLUE CROSS/BLUE SHIELD | Admitting: *Deleted

## 2017-03-16 DIAGNOSIS — J309 Allergic rhinitis, unspecified: Secondary | ICD-10-CM | POA: Diagnosis not present

## 2017-03-20 ENCOUNTER — Other Ambulatory Visit: Payer: Self-pay | Admitting: *Deleted

## 2017-03-20 MED ORDER — FLUTICASONE PROPIONATE 50 MCG/ACT NA SUSP: NASAL | 1 refills | Status: DC

## 2017-03-24 ENCOUNTER — Ambulatory Visit (INDEPENDENT_AMBULATORY_CARE_PROVIDER_SITE_OTHER): Payer: BLUE CROSS/BLUE SHIELD | Admitting: *Deleted

## 2017-03-24 DIAGNOSIS — J309 Allergic rhinitis, unspecified: Secondary | ICD-10-CM | POA: Diagnosis not present

## 2017-03-28 NOTE — Progress Notes (Signed)
VIALS EXP 03-28-18

## 2017-03-29 DIAGNOSIS — J301 Allergic rhinitis due to pollen: Secondary | ICD-10-CM | POA: Diagnosis not present

## 2017-03-31 ENCOUNTER — Ambulatory Visit (INDEPENDENT_AMBULATORY_CARE_PROVIDER_SITE_OTHER): Payer: BLUE CROSS/BLUE SHIELD | Admitting: *Deleted

## 2017-03-31 DIAGNOSIS — J309 Allergic rhinitis, unspecified: Secondary | ICD-10-CM

## 2017-04-06 ENCOUNTER — Ambulatory Visit (INDEPENDENT_AMBULATORY_CARE_PROVIDER_SITE_OTHER): Payer: BLUE CROSS/BLUE SHIELD | Admitting: *Deleted

## 2017-04-06 DIAGNOSIS — J309 Allergic rhinitis, unspecified: Secondary | ICD-10-CM | POA: Diagnosis not present

## 2017-04-20 ENCOUNTER — Ambulatory Visit (INDEPENDENT_AMBULATORY_CARE_PROVIDER_SITE_OTHER): Payer: BLUE CROSS/BLUE SHIELD | Admitting: *Deleted

## 2017-04-20 DIAGNOSIS — J309 Allergic rhinitis, unspecified: Secondary | ICD-10-CM

## 2017-04-28 ENCOUNTER — Ambulatory Visit (INDEPENDENT_AMBULATORY_CARE_PROVIDER_SITE_OTHER): Payer: BLUE CROSS/BLUE SHIELD | Admitting: *Deleted

## 2017-04-28 DIAGNOSIS — J309 Allergic rhinitis, unspecified: Secondary | ICD-10-CM | POA: Diagnosis not present

## 2017-05-08 ENCOUNTER — Other Ambulatory Visit: Payer: Self-pay | Admitting: Allergy and Immunology

## 2017-05-12 ENCOUNTER — Ambulatory Visit (INDEPENDENT_AMBULATORY_CARE_PROVIDER_SITE_OTHER): Payer: BLUE CROSS/BLUE SHIELD | Admitting: *Deleted

## 2017-05-12 DIAGNOSIS — J309 Allergic rhinitis, unspecified: Secondary | ICD-10-CM | POA: Diagnosis not present

## 2017-05-16 DIAGNOSIS — Z1231 Encounter for screening mammogram for malignant neoplasm of breast: Secondary | ICD-10-CM | POA: Diagnosis not present

## 2017-05-16 LAB — HM MAMMOGRAPHY

## 2017-05-25 ENCOUNTER — Ambulatory Visit (INDEPENDENT_AMBULATORY_CARE_PROVIDER_SITE_OTHER): Payer: BLUE CROSS/BLUE SHIELD | Admitting: *Deleted

## 2017-05-25 DIAGNOSIS — J309 Allergic rhinitis, unspecified: Secondary | ICD-10-CM

## 2017-06-02 ENCOUNTER — Encounter: Payer: Self-pay | Admitting: General Practice

## 2017-06-02 ENCOUNTER — Ambulatory Visit (INDEPENDENT_AMBULATORY_CARE_PROVIDER_SITE_OTHER): Payer: BLUE CROSS/BLUE SHIELD | Admitting: *Deleted

## 2017-06-02 DIAGNOSIS — J309 Allergic rhinitis, unspecified: Secondary | ICD-10-CM

## 2017-06-06 ENCOUNTER — Other Ambulatory Visit: Payer: Self-pay | Admitting: Family Medicine

## 2017-06-08 ENCOUNTER — Ambulatory Visit (INDEPENDENT_AMBULATORY_CARE_PROVIDER_SITE_OTHER): Payer: BLUE CROSS/BLUE SHIELD | Admitting: *Deleted

## 2017-06-08 DIAGNOSIS — J309 Allergic rhinitis, unspecified: Secondary | ICD-10-CM | POA: Diagnosis not present

## 2017-06-15 ENCOUNTER — Ambulatory Visit (INDEPENDENT_AMBULATORY_CARE_PROVIDER_SITE_OTHER): Payer: BLUE CROSS/BLUE SHIELD

## 2017-06-15 DIAGNOSIS — J309 Allergic rhinitis, unspecified: Secondary | ICD-10-CM | POA: Diagnosis not present

## 2017-06-21 ENCOUNTER — Other Ambulatory Visit: Payer: Self-pay | Admitting: Allergy and Immunology

## 2017-06-22 ENCOUNTER — Encounter: Payer: Self-pay | Admitting: Allergy and Immunology

## 2017-06-22 ENCOUNTER — Ambulatory Visit: Payer: BLUE CROSS/BLUE SHIELD | Admitting: Allergy and Immunology

## 2017-06-22 ENCOUNTER — Telehealth: Payer: Self-pay

## 2017-06-22 VITALS — BP 112/70 | HR 88 | Resp 24

## 2017-06-22 DIAGNOSIS — H9311 Tinnitus, right ear: Secondary | ICD-10-CM | POA: Diagnosis not present

## 2017-06-22 DIAGNOSIS — R42 Dizziness and giddiness: Secondary | ICD-10-CM | POA: Diagnosis not present

## 2017-06-22 DIAGNOSIS — J3089 Other allergic rhinitis: Secondary | ICD-10-CM

## 2017-06-22 DIAGNOSIS — T781XXD Other adverse food reactions, not elsewhere classified, subsequent encounter: Secondary | ICD-10-CM | POA: Diagnosis not present

## 2017-06-22 DIAGNOSIS — J4540 Moderate persistent asthma, uncomplicated: Secondary | ICD-10-CM

## 2017-06-22 DIAGNOSIS — H6981 Other specified disorders of Eustachian tube, right ear: Secondary | ICD-10-CM

## 2017-06-22 NOTE — Telephone Encounter (Signed)
Referral faxed to [facility]. Will follow back up with the ENT office next week.

## 2017-06-22 NOTE — Progress Notes (Signed)
Follow-up Note  Referring Provider: Midge Minium, MD Primary Provider: Midge Minium, MD Date of Office Visit: 06/22/2017  Subjective:   Tina Williams (DOB: 1961-06-17) is a 56 y.o. female who returns to the All[facility] and [place] on 06/22/2017 in re-evaluation of the following:  HPI: Tina Williams returns to this clinic in reevaluation of her asthma and allergic rhinitis and oral pollinosis syndrome.  Her last visit to this clinic was 13 February 2017.  She has really done very well.  She has not required a systemic steroid or antibiotic to treat any type of respiratory tract issue.  She has very little need to use a short acting bronchodilator and can exercise without any problem.  She continues to use anti-inflammatory agents as well as immunotherapy.  Presently her immunotherapy is every week.  She has not had an adverse effect secondary to the use of this medication.  This past weekend she developed acute onset of positional vertigo.  Whenever she bent down she would start to spin.  She took chlorpheniramine and Sudafed which helped this vertigo somewhat.  But now her ear feels funny.  It feels as though they are swollen up on the right and as well she has the sensation of a very mild right sore throat and she has had some decreased ability to hear.  She has had a long history of recurrent problems with "crickets" with her hearing that appears to be an intermittent issue.  She sometimes does have intermittent vertigo.  She has never seen an ENT doctor regarding this issue.  Allergies as of 06/22/2017      Reactions   Latex Other (See Comments)   Throat swelling, chest tightness   Amoxicillin Hives   Clarithromycin    REACTION: hives   Naproxen Sodium    REACTION: HIVES   Penicillins    REACTION: hives   Sulfa Antibiotics Hives      Medication List           ASMANEX HFA 200 MCG/ACT Aero Generic drug:  Mometasone Furoate TAKE 2 PUFFS BY MOUTH TWICE A DAY     cetirizine 10 MG tablet Commonly known as:  ZYRTEC Take 10 mg by mouth daily.   DULoxetine 60 MG capsule Commonly known as:  CYMBALTA Take 1 capsule (60 mg total) by mouth daily.   DULoxetine 30 MG capsule Commonly known as:  CYMBALTA Take 1 capsule (30 mg total) by mouth daily.   EPIPEN 2-PAK 0.3 mg/0.3 mL Soaj injection Generic drug:  EPINEPHrine USE AS DIRECTED FOR SEVERE LIFE-THREATENING ALLERGIC REACTION.   FIBER PO Take by mouth.   fluticasone 50 MCG/ACT nasal spray Commonly known as:  FLONASE USE ONE SPRAY IN EACH NOSTRIL TWICE DAILY   loratadine 10 MG tablet Commonly known as:  CLARITIN Take 10 mg by mouth daily.   montelukast 10 MG tablet Commonly known as:  SINGULAIR Take one tablet once daily once daily as directed   NON FORMULARY daily. Herbal supplement: Bowel Stimulant   omeprazole 40 MG capsule Commonly known as:  PRILOSEC TAKE 1 CAPSULE (40 MG TOTAL) BY MOUTH DAILY.   PROAIR HFA 108 (90 Base) MCG/ACT inhaler Generic drug:  albuterol Inhale into the lungs every 6 (six) hours as needed for wheezing or shortness of breath.   TYLENOL PO Take by mouth.   Vitamin B-12 1000 MCG Subl Place 1 tablet under the tongue daily.   Vitamin D-3 5000 units Tabs Take 1 tablet by mouth daily.  Past Medical History:  Diagnosis Date  . Allergy   . Asthma   . Depression   . Early menopause   . Hx of adenomatous polyp of colon 01/11/2016  . Hyperlipidemia   . Non-allergic rhinitis   . Obesity   . Osteoarthritis   . Personal history of colonic adenoma  11/26/2009  . Sinusitis acute   . URI (upper respiratory infection)     Past Surgical History:  Procedure Laterality Date  . CESAREAN SECTION  1988  . KNEE SURGERY Right 1998  . WISDOM TOOTH EXTRACTION  1984    Review of systems negative except as noted in HPI / PMHx or noted below:  Review of Systems  Constitutional: Negative.   HENT: Negative.   Eyes: Negative.   Respiratory: Negative.    Cardiovascular: Negative.   Gastrointestinal: Negative.   Genitourinary: Negative.   Musculoskeletal: Negative.   Skin: Negative.   Neurological: Negative.   Endo/Heme/Allergies: Negative.   Psychiatric/Behavioral: Negative.      Objective:   Vitals:   06/22/17 1136  BP: 112/70  Pulse: 88  Resp: (!) 24          Physical Exam  Constitutional: She is well-developed, well-nourished, and in no distress.  HENT:  Head: Normocephalic.  Right Ear: External ear and ear canal normal. A middle ear effusion (Lack of pneumatic movement) is present.  Left Ear: Tympanic membrane, external ear and ear canal normal.  Nose: Nose normal. No mucosal edema or rhinorrhea.  Mouth/Throat: Uvula is midline, oropharynx is clear and moist and mucous membranes are normal. No oropharyngeal exudate.  Eyes: Conjunctivae are normal.  Neck: Trachea normal. No tracheal tenderness present. No tracheal deviation present. No thyromegaly present.  Cardiovascular: Normal rate, regular rhythm, S1 normal, S2 normal and normal heart sounds.  No murmur heard. Pulmonary/Chest: Breath sounds normal. No stridor. No respiratory distress. She has no wheezes. She has no rales.  Musculoskeletal: She exhibits no edema.  Lymphadenopathy:       Head (right side): No tonsillar adenopathy present.       Head (left side): No tonsillar adenopathy present.    She has no cervical adenopathy.  Neurological: She is alert. Gait normal.  Skin: No rash noted. She is not diaphoretic. No erythema. Nails show no clubbing.  Psychiatric: Mood and affect normal.    Diagnostics:    Spirometry was performed and demonstrated an FEV1 of 2.0 at 85 % of predicted.  The patient had an Asthma Control Test with the following results: ACT Total Score: 18.    Assessment and Plan:   1. Asthma, moderate persistent, well-controlled   2. Oral allergy syndrome, subsequent encounter   3. Other allergic rhinitis   4. ETD (Eustachian tube  dysfunction), right   5. Vertigo   6. Tinnitus of right ear     1. Continue Immunotherapy and Epi-Pen  2. Continue Flonase 1-2 sprays each nostril 3-7 times per week depending on upper airway activity   3. Continue Asmanex 200 HFA two inhalations 1-2 times per day depending on asthma activity.   4. Continue Montelukast 10mg  one tablet one time per day  5. Continue claritin / zyrtec, nasal saline if needed  6. Continue Proair HFA if needed  7. Treat ETD with prednisone 10mg  tablet one time per day for 10 days  8. Evaluation with ENT    9. Return in 6 months or earlier if problem  Laveah appears to be doing relatively well with her airway issue while  continuing to use anti-inflammatory medications for her respiratory tract and immunotherapy.  Her active issue at this point in time is ETD with vertigo and tinnitus and I will treat her with anti-inflammatory agents for this issue with the use of prednisone and have her thoroughly evaluated with an ENT doctor especially given the fact that this appears to be a recurrent phenomena.  I will see her back in his clinic in 6 months or earlier if there is a problem.  Allena Katz, MD Allergy / Immunology [place]

## 2017-06-22 NOTE — Patient Instructions (Addendum)
  1. Continue Immunotherapy and Epi-Pen  2. Continue Flonase 1-2 sprays each nostril 3-7 times per week depending on upper airway activity   3. Continue Asmanex 200 HFA two inhalations 1-2 times per day depending on asthma activity.   4. Continue Montelukast 10mg  one tablet one time per day  5. Continue claritin / zyrtec, nasal saline if needed  6. Continue Proair HFA if needed  7. Treat ETD with prednisone 10mg  tablet one time per day for 10 days  8. Evaluation with ENT    9. Return in 6 months or earlier if problem

## 2017-06-22 NOTE — Telephone Encounter (Signed)
-----   Message from [place], LPN sent at 74/01/1447 11:53 AM EST ----- Regarding: Referral Needs to be referred to [facility] ENT for eustachian tube dysfunction.  Thank you!

## 2017-06-23 ENCOUNTER — Encounter: Payer: Self-pay | Admitting: Allergy and Immunology

## 2017-06-29 ENCOUNTER — Ambulatory Visit (INDEPENDENT_AMBULATORY_CARE_PROVIDER_SITE_OTHER): Payer: BLUE CROSS/BLUE SHIELD | Admitting: *Deleted

## 2017-06-29 DIAGNOSIS — J309 Allergic rhinitis, unspecified: Secondary | ICD-10-CM | POA: Diagnosis not present

## 2017-06-30 NOTE — Telephone Encounter (Signed)
Patient scheduled to see Dr. Gaylyn Cheers on 07/10/17

## 2017-07-03 NOTE — Telephone Encounter (Signed)
Message left to inform pt of pending appt with Dr. Gaylyn Cheers

## 2017-07-05 NOTE — Telephone Encounter (Signed)
Message left x 2 for patient to call regarding pending appt with Dr. Gaylyn Cheers

## 2017-07-10 DIAGNOSIS — R42 Dizziness and giddiness: Secondary | ICD-10-CM | POA: Diagnosis not present

## 2017-07-10 DIAGNOSIS — H9193 Unspecified hearing loss, bilateral: Secondary | ICD-10-CM | POA: Diagnosis not present

## 2017-07-10 DIAGNOSIS — H9313 Tinnitus, bilateral: Secondary | ICD-10-CM | POA: Diagnosis not present

## 2017-07-10 DIAGNOSIS — H938X3 Other specified disorders of ear, bilateral: Secondary | ICD-10-CM | POA: Diagnosis not present

## 2017-07-12 DIAGNOSIS — J0100 Acute maxillary sinusitis, unspecified: Secondary | ICD-10-CM | POA: Diagnosis not present

## 2017-07-13 ENCOUNTER — Other Ambulatory Visit: Payer: Self-pay | Admitting: Family Medicine

## 2017-07-17 NOTE — Telephone Encounter (Signed)
Received notes from Dr. Gaylyn Cheers dated 07/10/17

## 2017-07-21 ENCOUNTER — Ambulatory Visit (INDEPENDENT_AMBULATORY_CARE_PROVIDER_SITE_OTHER): Payer: BLUE CROSS/BLUE SHIELD

## 2017-07-21 DIAGNOSIS — H905 Unspecified sensorineural hearing loss: Secondary | ICD-10-CM | POA: Diagnosis not present

## 2017-07-21 DIAGNOSIS — H903 Sensorineural hearing loss, bilateral: Secondary | ICD-10-CM | POA: Diagnosis not present

## 2017-07-21 DIAGNOSIS — H938X3 Other specified disorders of ear, bilateral: Secondary | ICD-10-CM | POA: Diagnosis not present

## 2017-07-21 DIAGNOSIS — J309 Allergic rhinitis, unspecified: Secondary | ICD-10-CM

## 2017-07-21 DIAGNOSIS — H9319 Tinnitus, unspecified ear: Secondary | ICD-10-CM | POA: Diagnosis not present

## 2017-07-28 ENCOUNTER — Ambulatory Visit (INDEPENDENT_AMBULATORY_CARE_PROVIDER_SITE_OTHER): Payer: BLUE CROSS/BLUE SHIELD | Admitting: *Deleted

## 2017-07-28 DIAGNOSIS — J309 Allergic rhinitis, unspecified: Secondary | ICD-10-CM | POA: Diagnosis not present

## 2017-08-04 ENCOUNTER — Ambulatory Visit (INDEPENDENT_AMBULATORY_CARE_PROVIDER_SITE_OTHER): Payer: BLUE CROSS/BLUE SHIELD | Admitting: *Deleted

## 2017-08-04 DIAGNOSIS — J309 Allergic rhinitis, unspecified: Secondary | ICD-10-CM

## 2017-08-07 ENCOUNTER — Other Ambulatory Visit: Payer: Self-pay | Admitting: Allergy and Immunology

## 2017-08-11 ENCOUNTER — Ambulatory Visit (INDEPENDENT_AMBULATORY_CARE_PROVIDER_SITE_OTHER): Payer: BLUE CROSS/BLUE SHIELD

## 2017-08-11 DIAGNOSIS — J309 Allergic rhinitis, unspecified: Secondary | ICD-10-CM

## 2017-08-13 ENCOUNTER — Other Ambulatory Visit: Payer: Self-pay | Admitting: Allergy and Immunology

## 2017-08-14 ENCOUNTER — Ambulatory Visit: Payer: BLUE CROSS/BLUE SHIELD | Admitting: Allergy and Immunology

## 2017-08-18 ENCOUNTER — Other Ambulatory Visit: Payer: Self-pay | Admitting: *Deleted

## 2017-08-18 ENCOUNTER — Ambulatory Visit (INDEPENDENT_AMBULATORY_CARE_PROVIDER_SITE_OTHER): Payer: BLUE CROSS/BLUE SHIELD | Admitting: *Deleted

## 2017-08-18 DIAGNOSIS — J309 Allergic rhinitis, unspecified: Secondary | ICD-10-CM | POA: Diagnosis not present

## 2017-08-18 MED ORDER — EPINEPHRINE 0.3 MG/0.3ML IJ SOAJ: INTRAMUSCULAR | 1 refills | Status: DC

## 2017-08-20 ENCOUNTER — Other Ambulatory Visit: Payer: Self-pay | Admitting: Allergy and Immunology

## 2017-08-22 ENCOUNTER — Encounter: Payer: Self-pay | Admitting: General Practice

## 2017-08-24 NOTE — Progress Notes (Signed)
VIALS EXP 08-24-18

## 2017-08-25 ENCOUNTER — Ambulatory Visit (INDEPENDENT_AMBULATORY_CARE_PROVIDER_SITE_OTHER): Payer: BLUE CROSS/BLUE SHIELD | Admitting: *Deleted

## 2017-08-25 DIAGNOSIS — J309 Allergic rhinitis, unspecified: Secondary | ICD-10-CM

## 2017-08-28 DIAGNOSIS — J301 Allergic rhinitis due to pollen: Secondary | ICD-10-CM | POA: Diagnosis not present

## 2017-09-01 ENCOUNTER — Other Ambulatory Visit: Payer: Self-pay | Admitting: *Deleted

## 2017-09-01 ENCOUNTER — Ambulatory Visit (INDEPENDENT_AMBULATORY_CARE_PROVIDER_SITE_OTHER): Payer: BLUE CROSS/BLUE SHIELD | Admitting: *Deleted

## 2017-09-01 ENCOUNTER — Telehealth: Payer: Self-pay | Admitting: Allergy and Immunology

## 2017-09-01 DIAGNOSIS — J309 Allergic rhinitis, unspecified: Secondary | ICD-10-CM

## 2017-09-01 MED ORDER — EPINEPHRINE 0.3 MG/0.3ML IJ SOAJ: INTRAMUSCULAR | 3 refills | Status: DC

## 2017-09-01 NOTE — Telephone Encounter (Signed)
Rx sent 

## 2017-09-01 NOTE — Telephone Encounter (Signed)
Tina Williams would like a prescription sent to [facility] in [place] for her epi-pens.  The prescription she had sent the last ti me was to [facility] in [place] and she uses the one in [place].

## 2017-09-15 ENCOUNTER — Ambulatory Visit (INDEPENDENT_AMBULATORY_CARE_PROVIDER_SITE_OTHER): Payer: BLUE CROSS/BLUE SHIELD | Admitting: *Deleted

## 2017-09-15 DIAGNOSIS — J309 Allergic rhinitis, unspecified: Secondary | ICD-10-CM | POA: Diagnosis not present

## 2017-09-27 ENCOUNTER — Ambulatory Visit: Payer: BLUE CROSS/BLUE SHIELD | Admitting: Family Medicine

## 2017-09-27 ENCOUNTER — Encounter: Payer: Self-pay | Admitting: Family Medicine

## 2017-09-27 VITALS — BP 134/82 | HR 96 | Temp 97.7°F | Resp 18

## 2017-09-27 DIAGNOSIS — J309 Allergic rhinitis, unspecified: Secondary | ICD-10-CM

## 2017-09-27 DIAGNOSIS — R42 Dizziness and giddiness: Secondary | ICD-10-CM | POA: Diagnosis not present

## 2017-09-27 DIAGNOSIS — H1010 Acute atopic conjunctivitis, unspecified eye: Secondary | ICD-10-CM | POA: Diagnosis not present

## 2017-09-27 DIAGNOSIS — H9311 Tinnitus, right ear: Secondary | ICD-10-CM | POA: Insufficient documentation

## 2017-09-27 DIAGNOSIS — J4530 Mild persistent asthma, uncomplicated: Secondary | ICD-10-CM

## 2017-09-27 DIAGNOSIS — T781XXD Other adverse food reactions, not elsewhere classified, subsequent encounter: Secondary | ICD-10-CM

## 2017-09-27 HISTORY — DX: Mild persistent asthma, uncomplicated: J45.30

## 2017-09-27 HISTORY — DX: Dizziness and giddiness: R42

## 2017-09-27 HISTORY — DX: Acute atopic conjunctivitis, unspecified eye: H10.10

## 2017-09-27 HISTORY — DX: Tinnitus, right ear: H93.11

## 2017-09-27 MED ORDER — AZELASTINE HCL 0.15 % NA SOLN: 2.0000 | Freq: Two times a day (BID) | NASAL | 5 refills | Status: DC

## 2017-09-27 NOTE — Progress Notes (Signed)
[address] [place] [address] Dept: [phone]  FOLLOW UP NOTE  Patient ID: Tina Williams, female    DOB: 1961-01-26  Age: 57 y.o. MRN: [id number] Date of Office Visit: 09/27/2017  Assessment  Chief Complaint: Ear Problem (sinus congestion is causing vertigo issues. ongoing x5 days. runny nose, no coughing. pressure in her head. )  HPI Tina Williams is a 57 year old female who presents for a sick visit. She was last seen in this clinic on 06/22/2017 by Dr. Neldon Mc for evaluation of allergic rhinitis, oral allergy syndrome, and asthma. At that time, her asthma was well controlled with Asmanex 200 and montelukast 10 mg once a day. ETD was treated with prednisone 10 mg one tablet once a day for 10 days. She was continued on Flonase nasal spray 1-2 sprays 3-7 times a week as needed.  At today's visit, she reports she had another episode of vertigo on Saturday night when she was bending over while at work. At that time, she took over the counter meclizine and her dizziness and nausea improved in about 30-45 minutes. She reports a sensation of sinus swelling and right ear fullness that began a few days ago. She reports this is the third episode of vertigo and she believes that it may be related to her allergies. She saw Dr. Gaylyn Cheers, ENT specialist, in December and there were no findings of eustachian tube dysfunction.  However, borderline high-frequency sensorineural hearing loss was noted at the visit on 07/21/2017.  Asthma is reported as moderately well controlled. She reports shortness of breath, wheeze, and dry non-productive cough with activity.  She is currently using Asmanex 200-2 puffs twice a day, montelukast 10 mg once a day, and her albuterol inhaler 1 time a day.  Allergic rhinitis is reported as mildly well controlled.  Tina Williams reports clear nasal drainage, headache that began last night lasting 1 night, and sinus swelling.  She is using Flonase 2 sprays in each nostril 1 time a day  and Zyrtec 10 mg 1-2 tablets daily.  She reports receiving allergen immunotherapy to grass pollen, tree pollen, and weed pollen for approximately 3 years.  She is not currently using any nasal saline rinses.  Her current medications are listed in the chart.   Drug Allergies:  Allergies  Allergen Reactions  . Latex Other (See Comments)    Throat swelling, chest tightness  . Amoxicillin Hives  . Clarithromycin     REACTION: hives  . Naproxen Sodium     REACTION: HIVES  . Penicillins     REACTION: hives  . Sulfa Antibiotics Hives    Physical Exam: BP 134/82   Pulse 96   Temp 97.7 F (36.5 C) (Oral)   Resp 18   SpO2 98%    Physical Exam  Constitutional: She is oriented to person, place, and time. She appears well-developed and well-nourished.  HENT:  Head: Normocephalic and atraumatic.  Right Ear: External ear normal.  Left Ear: External ear normal.  Mouth/Throat: Oropharynx is clear and moist.  Bilateral nares slightly erythematous and edematous with the left side more than the right and no nasal drainage noted.  Bilateral TMs within normal limits.  No redness in the ear canals  Eyes: Conjunctivae are normal.  Neck: Normal range of motion. Neck supple.  Cardiovascular: Normal rate, regular rhythm and normal heart sounds.  No murmur noted  Pulmonary/Chest: Effort normal and breath sounds normal.  Lungs clear to auscultation  Musculoskeletal: Normal range of motion.  Neurological:  She is alert and oriented to person, place, and time.  Skin: Skin is warm and dry.  Psychiatric: She has a normal mood and affect. Her behavior is normal.    Diagnostics: FVC 2.78, FEV1 2.35.  Predicted FVC 3.05, predicted FEV1 2.38.  Spirometry is noted to be within the normal range.  Assessment and Plan: 1. Mild persistent asthma without complication   2. Oral allergy syndrome, subsequent encounter   3. Vertigo   4. Allergic rhinoconjunctivitis   5. Tinnitus of right ear     Meds  ordered this encounter  Medications  . Azelastine HCl 0.15 % SOLN    Sig: Place 2 sprays into both nostrils 2 (two) times daily.    Dispense:  30 mL    Refill:  5    Patient Instructions   1. Continue Immunotherapy and Epi-Pen  2. Continue Flonase 1-2 sprays each nostril twice a day. Begin Astelin nasal spray 2 sprays in each nostril twice a day. Begin nasal saline rinses once or twice a day before medicated sprays.  3. Continue Asmanex 200 HFA two inhalations 1-2 times per day depending on asthma activity.   4. Continue Montelukast 10mg  one tablet one time per day  5. Begin Xyzal 5 mg once a day.  6. Continue Proair HFA if needed  7. Follow up with your primary care doctor regarding vertigo  8.  Return in 6 months or earlier if problem   Return in about 6 months (around 03/29/2018), or if symptoms worsen or fail to improve.    Thank you for the opportunity to care for this patient.  Please do not hesitate to contact me with questions.  Gareth Morgan, FNP [facility]

## 2017-09-27 NOTE — Patient Instructions (Addendum)
  1. Continue Immunotherapy and Epi-Pen  2. Continue Flonase 1-2 sprays each nostril twice a day. Begin Astelin nasal spray 2 sprays in each nostril twice a day. Begin nasal saline rinses once or twice a day before medicated sprays.  3. Continue Asmanex 200 HFA two inhalations 1-2 times per day depending on asthma activity.   4. Continue Montelukast 10mg  one tablet one time per day  5. Begin Xyzal 5 mg once a day.  6. Continue Proair HFA if needed  7. Follow up with your primary care doctor regarding vertigo  8.  Return in 6 months or earlier if problem

## 2017-09-29 ENCOUNTER — Ambulatory Visit: Payer: BLUE CROSS/BLUE SHIELD | Admitting: Family Medicine

## 2017-09-29 ENCOUNTER — Other Ambulatory Visit: Payer: Self-pay

## 2017-09-29 ENCOUNTER — Encounter: Payer: Self-pay | Admitting: Family Medicine

## 2017-09-29 VITALS — BP 126/82 | HR 80 | Temp 98.3°F | Resp 16 | Ht 61.0 in | Wt 174.2 lb

## 2017-09-29 DIAGNOSIS — R42 Dizziness and giddiness: Secondary | ICD-10-CM | POA: Diagnosis not present

## 2017-09-29 NOTE — Progress Notes (Signed)
   Subjective:    Patient ID: Tina Williams, female    DOB: Jul 13, 1960, 57 y.o.   MRN: [id number]  HPI Vertigo- pt has hx of this.  Woke Saturday w/ dizziness.  Took OTC Meclizine w/ good relief.  Tuesday night bent over at work and 'it was just awful'- took 1.5 Meclizine w/ improvement of vertigo but not relief.  Woke Wednesday and dizziness was gone.  Pt notes R ear fullness w/ fluid behind TM since last July when she had an ear infxns.  Pt sees an allergist (Dr Neldon Mc) and was referred to ENT in [facility] and was told 'everything was normal'.   Review of Systems For ROS see HPI     Objective:   Physical Exam  Constitutional: She is oriented to person, place, and time. She appears well-developed and well-nourished. No distress.  HENT:  Head: Normocephalic and atraumatic.  Right Ear: Tympanic membrane normal.  Left Ear: Tympanic membrane normal.  Nose: Mucosal edema and rhinorrhea present. Right sinus exhibits no maxillary sinus tenderness and no frontal sinus tenderness. Left sinus exhibits no maxillary sinus tenderness and no frontal sinus tenderness.  Mouth/Throat: Mucous membranes are normal. Posterior oropharyngeal erythema (w/ PND) present.  Eyes: Pupils are equal, round, and reactive to light. Conjunctivae and EOM are normal.  Neck: Normal range of motion. Neck supple.  Cardiovascular: Normal rate, regular rhythm and normal heart sounds.  Pulmonary/Chest: Effort normal and breath sounds normal. No respiratory distress. She has no wheezes. She has no rales.  Lymphadenopathy:    She has no cervical adenopathy.  Neurological: She is alert and oriented to person, place, and time. She has normal reflexes. No cranial nerve deficit. Coordination normal.  No vertigo at this time  Vitals reviewed.         Assessment & Plan:

## 2017-09-29 NOTE — Assessment & Plan Note (Signed)
sxs have resolved spontaneously since making her appt w/ the use of OTC Meclizine.  Reviewed dx w/ pt and supportive care.  Encouraged ongoing tx of allergies which are likely contributing to her vertigo.  No med adjustments at this time.  Will continue to follow.

## 2017-09-29 NOTE — Patient Instructions (Signed)
Follow up as needed or as scheduled I'm glad the Vertigo is better Continue to drink plenty of fluids and change positions slowly to help with the dizziness The ear looks good!  Continue the allergy medications daily Call with any questions or concerns [place]!!

## 2017-10-04 DIAGNOSIS — J029 Acute pharyngitis, unspecified: Secondary | ICD-10-CM | POA: Diagnosis not present

## 2017-10-04 DIAGNOSIS — I889 Nonspecific lymphadenitis, unspecified: Secondary | ICD-10-CM | POA: Diagnosis not present

## 2017-10-12 ENCOUNTER — Ambulatory Visit (INDEPENDENT_AMBULATORY_CARE_PROVIDER_SITE_OTHER): Payer: BLUE CROSS/BLUE SHIELD | Admitting: *Deleted

## 2017-10-12 DIAGNOSIS — J309 Allergic rhinitis, unspecified: Secondary | ICD-10-CM

## 2017-10-27 ENCOUNTER — Ambulatory Visit (INDEPENDENT_AMBULATORY_CARE_PROVIDER_SITE_OTHER): Payer: BLUE CROSS/BLUE SHIELD | Admitting: *Deleted

## 2017-10-27 DIAGNOSIS — J309 Allergic rhinitis, unspecified: Secondary | ICD-10-CM

## 2017-11-14 ENCOUNTER — Other Ambulatory Visit: Payer: Self-pay

## 2017-11-14 ENCOUNTER — Ambulatory Visit: Payer: BLUE CROSS/BLUE SHIELD | Admitting: Family Medicine

## 2017-11-14 ENCOUNTER — Encounter: Payer: Self-pay | Admitting: Family Medicine

## 2017-11-14 VITALS — BP 114/82 | HR 79 | Temp 98.0°F | Resp 16 | Ht 61.0 in | Wt 173.8 lb

## 2017-11-14 DIAGNOSIS — I498 Other specified cardiac arrhythmias: Secondary | ICD-10-CM

## 2017-11-14 DIAGNOSIS — R5383 Other fatigue: Secondary | ICD-10-CM

## 2017-11-14 DIAGNOSIS — R9431 Abnormal electrocardiogram [ECG] [EKG]: Secondary | ICD-10-CM

## 2017-11-14 DIAGNOSIS — R21 Rash and other nonspecific skin eruption: Secondary | ICD-10-CM

## 2017-11-14 LAB — CBC WITH DIFFERENTIAL/PLATELET
Basophils Absolute: 0.1 10*3/uL (ref 0.0–0.1)
Basophils Relative: 1 % (ref 0.0–3.0)
Eosinophils Absolute: 0.2 10*3/uL (ref 0.0–0.7)
Eosinophils Relative: 3.2 % (ref 0.0–5.0)
HCT: 40.4 % (ref 36.0–46.0)
Hemoglobin: 13.2 g/dL (ref 12.0–15.0)
LYMPHS ABS: 1.5 10*3/uL (ref 0.7–4.0)
Lymphocytes Relative: 22.4 % (ref 12.0–46.0)
MCHC: 32.7 g/dL (ref 30.0–36.0)
MCV: 86.2 fl (ref 78.0–100.0)
MONO ABS: 0.6 10*3/uL (ref 0.1–1.0)
MONOS PCT: 9 % (ref 3.0–12.0)
NEUTROS ABS: 4.5 10*3/uL (ref 1.4–7.7)
NEUTROS PCT: 64.4 % (ref 43.0–77.0)
PLATELETS: 434 10*3/uL — AB (ref 150.0–400.0)
RBC: 4.69 Mil/uL (ref 3.87–5.11)
RDW: 15.5 % (ref 11.5–15.5)
WBC: 6.9 10*3/uL (ref 4.0–10.5)

## 2017-11-14 LAB — HEPATIC FUNCTION PANEL
ALK PHOS: 90 U/L (ref 39–117)
ALT: 12 U/L (ref 0–35)
AST: 12 U/L (ref 0–37)
Albumin: 4.4 g/dL (ref 3.5–5.2)
BILIRUBIN DIRECT: 0.1 mg/dL (ref 0.0–0.3)
Total Bilirubin: 0.6 mg/dL (ref 0.2–1.2)
Total Protein: 7 g/dL (ref 6.0–8.3)

## 2017-11-14 LAB — BASIC METABOLIC PANEL
BUN: 12 mg/dL (ref 6–23)
CHLORIDE: 102 meq/L (ref 96–112)
CO2: 31 meq/L (ref 19–32)
Calcium: 9.7 mg/dL (ref 8.4–10.5)
Creatinine, Ser: 0.59 mg/dL (ref 0.40–1.20)
GFR: 111.69 mL/min (ref >60.00)
GLUCOSE: 105 mg/dL — AB (ref 70–99)
POTASSIUM: 4.5 meq/L (ref 3.5–5.1)
SODIUM: 140 meq/L (ref 135–145)

## 2017-11-14 LAB — VITAMIN D 25 HYDROXY (VIT D DEFICIENCY, FRACTURES): VITD: 87.4 ng/mL (ref 30.00–100.00)

## 2017-11-14 LAB — B12 AND FOLATE PANEL
Folate: 11.3 ng/mL (ref >5.9)
Vitamin B-12: >1500 pg/mL — ABNORMAL HIGH (ref 211–911)

## 2017-11-14 LAB — TSH: TSH: 2.27 u[IU]/mL (ref 0.35–4.50)

## 2017-11-14 NOTE — Patient Instructions (Signed)
Follow up as needed or as scheduled We'll notify you of your lab results and make any changes if needed We will call you with your cardiology referral for complete evaluation Call with any questions or concerns Hang in there!!

## 2017-11-14 NOTE — Progress Notes (Signed)
   Subjective:    Patient ID: Tina Williams, female    DOB: 25-Dec-1960, 57 y.o.   MRN: [id number]  HPI Fatigue- 'i'm just dragging'.  Pt reports this has been going on for 'awhile' but over the last few weeks it has worsened to the point of impacting work.  Sleeping well at night.  Pt reports mood is 'fine'.  'heart thing'- pt reports she will have intermittent episodes where she feels like 'my heart is pumping hard'.  Denies racing heart or palpitations.  She states it feels similar to previous episodes when she was very anxious or scared.  Stopped antihistamine a few days ago w/o change in sxs and then restarted medication.  Denies irregular rhythm.  Denies SOB associated w/ 'hard pumping'.  No diaphoresis or nausea.  Exercising 3x/week but doesn't consistently have sxs with exercise.  sxs are occurring multiple times/day and do not occur in any particular setting or w/ any particular activity.  Redness of upper arms- 'it just doesn't go away'.  No itching or burning.  Pt unable to relay how long it has been present   Review of Systems For ROS see HPI     Objective:   Physical Exam  Constitutional: She is oriented to person, place, and time. She appears well-developed and well-nourished. No distress.  HENT:  Head: Normocephalic and atraumatic.  Eyes: Pupils are equal, round, and reactive to light. Conjunctivae and EOM are normal.  Neck: Normal range of motion. Neck supple. No thyromegaly present.  Cardiovascular: Normal rate, regular rhythm, normal heart sounds and intact distal pulses.  No murmur heard. Pulmonary/Chest: Effort normal and breath sounds normal. No respiratory distress.  Abdominal: Soft. She exhibits no distension. There is no tenderness.  Musculoskeletal: She exhibits no edema.  Lymphadenopathy:    She has no cervical adenopathy.  Neurological: She is alert and oriented to person, place, and time.  Skin: Skin is warm and dry. There is erythema (pt has nonblanching  erythem of upper arms bilaterally).  Psychiatric: She has a normal mood and affect. Her behavior is normal.  Vitals reviewed.         Assessment & Plan:  Fatigue- unclear as to cause.  Likely multifactorial- stress, deconditioning, short PR interval.  Check labs to assess for metabolic causes.  Will follow closely and determine next steps based on lab results  Heart flutter- pt has short PR interval on EKG today.  Based on this and the fact that her son required an ablation as a teen, will refer to Cards for complete evaluation and tx.  Skin eruption- new.  Pt has non-blanching erythema of upper arms bilaterally.  Check ANA to assess for auto-immune process- which would also explain her fatigue.

## 2017-11-15 ENCOUNTER — Other Ambulatory Visit: Payer: Self-pay | Admitting: Allergy and Immunology

## 2017-11-16 ENCOUNTER — Other Ambulatory Visit: Payer: Self-pay | Admitting: Family Medicine

## 2017-11-16 DIAGNOSIS — R768 Other specified abnormal immunological findings in serum: Secondary | ICD-10-CM

## 2017-11-16 LAB — ANTI-NUCLEAR AB-TITER (ANA TITER): ANA Titer 1: 1:40 {titer} — ABNORMAL HIGH

## 2017-11-16 LAB — ANA: ANA: POSITIVE — AB

## 2017-11-17 ENCOUNTER — Ambulatory Visit (INDEPENDENT_AMBULATORY_CARE_PROVIDER_SITE_OTHER): Payer: BLUE CROSS/BLUE SHIELD | Admitting: *Deleted

## 2017-11-17 DIAGNOSIS — J309 Allergic rhinitis, unspecified: Secondary | ICD-10-CM | POA: Diagnosis not present

## 2017-11-27 DIAGNOSIS — R011 Cardiac murmur, unspecified: Secondary | ICD-10-CM | POA: Insufficient documentation

## 2017-11-27 DIAGNOSIS — R5383 Other fatigue: Secondary | ICD-10-CM | POA: Insufficient documentation

## 2017-11-27 HISTORY — DX: Other fatigue: R53.83

## 2017-11-27 NOTE — Progress Notes (Signed)
Cardiology Office Note:    Date:  11/28/2017   ID:  Tina Williams, DOB 1961-06-04, MRN [id number]  PCP:  Midge Minium, MD  Cardiologist:  Shirlee More, MD   Referring MD: Midge Minium, MD  ASSESSMENT:    1. Abnormal EKG   2. Palpitations   3. ANA positive    PLAN:    In order of problems listed above:  1. Echocardiogram with abnormal EKG and palpitation.  Particularly to exclude cardiomyopathy. 2. She is an extended 14 days Holter monitor try to ascertain whether she has arrhythmia correlating with symptoms 3. Await rheumatology evaluation.  I reassured her that most of these disorders spare the heart.  Next appointment 6 to 8 weeks   Medication Adjustments/Labs and Tests Ordered: Current medicines are reviewed at length with the patient today.  Concerns regarding medicines are outlined above.  Orders Placed This Encounter  Procedures  . LONG TERM MONITOR (3-14 DAYS)  . ECHOCARDIOGRAM COMPLETE   No orders of the defined types were placed in this encounter.    Chief Complaint  Patient presents with  . New Patient (Initial Visit)  . Abnormal ECG  . Palpitations    History of Present Illness:    Tina Williams is a 57 y.o. female who is being seen today for the evaluation of palpitation at the request of Midge Minium, MD. She recently had an outpatient EKG showing anteroseptal T wave inversion.  This can be seen in normals and also underlying cardiac pathology.  She has no history of congenital rheumatic heart disease.  Recently she has become aware of her heart beating it previously occurred in the past was infrequent now it occurs several times a day her heart feels forceful pounding and fluttering usually lasting a few seconds.  It was worse when she is to albuterol infrequently but has persisted despite using it regularly.  She has exertional shortness of breath and asthma but has had no angina syncope edema or TIA.  She has had systemic symptoms of  fatigue joint pain rash and had a recent ANA that was weakly positive in a nucleolar pattern.  She is awaiting evaluation by rheumatology.  Past Medical History:  Diagnosis Date  . Abdominal pain, epigastric 05/13/2014  . Allergic rhinoconjunctivitis 09/27/2017  . Allergy   . Asthma   . ASTHMA 10/09/2008   HFA 75% p coaching  09/27/10    . Depression   . Early menopause   . Fatigue 11/27/2017  . FOOT PAIN, BILATERAL 10/28/2009   Qualifier: Diagnosis of  By: Birdie Riddle MD, Belenda Cruise    . General medical examination 03/23/2011  . H/O oralPollinosis 03/04/2015  . Hx of adenomatous polyp of colon 01/11/2016  . Hyperlipidemia   . MENOPAUSE, EARLY 07/09/2008   Qualifier: Diagnosis of  By: Ronnald Ramp CMA, Chemira    . Mild persistent asthma without complication 0/02/6282  . Moderate recurrent major depression ([place]) 01/06/2014  . MYALGIA 11/24/2009   Qualifier: Diagnosis of  By: Birdie Riddle MD, Belenda Cruise    . Non-allergic rhinitis   . Obesity   . OBESITY 07/09/2008   Qualifier: Diagnosis of  By: Birdie Riddle MD, Belenda Cruise    . Oral allergy syndrome, subsequent encounter 03/04/2015  . Osteoarthritis   . Personal history of colonic adenoma  11/26/2009  . Polyarthralgia 10/25/2012  . Respiratory tract infection 03/04/2015  . RHINITIS 10/09/2008   Qualifier: Diagnosis of  By: Birdie Riddle MD, Belenda Cruise    . Sinusitis acute   . Tinnitus of right  ear 09/27/2017  . URI (upper respiratory infection)   . Vertigo 09/27/2017  . Vitamin D deficiency 10/28/2016    Past Surgical History:  Procedure Laterality Date  . CESAREAN SECTION  1988  . KNEE SURGERY Right 1998  . WISDOM TOOTH EXTRACTION  1984    Current Medications: Current Meds  Medication Sig  . Acetaminophen (TYLENOL PO) Take 2 tablets by mouth every 8 (eight) hours as needed.   Marland Kitchen albuterol (PROAIR HFA) 108 (90 Base) MCG/ACT inhaler Inhale into the lungs every 6 (six) hours as needed for wheezing or shortness of breath.  Darlin Priestly HFA 200 MCG/ACT AERO TAKE 2 PUFFS BY MOUTH TWICE A  DAY  . Cholecalciferol (VITAMIN D-3) 5000 units TABS Take 1 tablet by mouth daily.  . Cyanocobalamin (VITAMIN B-12) 1000 MCG SUBL Place 1 tablet under the tongue daily.  . DULoxetine (CYMBALTA) 30 MG capsule Take 1 capsule (30 mg total) by mouth daily.  Marland Kitchen EPINEPHrine (EPIPEN 2-PAK) 0.3 mg/0.3 mL IJ SOAJ injection Use as directed for life threatening allergic reactions  . FIBER PO Take 2 tablets by mouth daily as needed.   . fluticasone (FLONASE) 50 MCG/ACT nasal spray USE ONE SPRAY IN EACH NOSTRIL TWICE DAILY  . levocetirizine (XYZAL) 5 MG tablet Take 5 mg by mouth every evening.  . Misc Natural Products (IMMUNE FORMULA PO) Take 1 tablet by mouth daily.  . montelukast (SINGULAIR) 10 MG tablet TAKE ONE TABLET ONCE DAILY ONCE DAILY AS DIRECTED  . omeprazole (PRILOSEC) 40 MG capsule TAKE 1 CAPSULE (40 MG TOTAL) BY MOUTH DAILY.     Allergies:   Latex; Amoxicillin; Clarithromycin; Naproxen sodium; Penicillins; and Sulfa antibiotics   Social History   Socioeconomic History  . Marital status: Married    Spouse name: Not on file  . Number of children: 3  . Years of education: Not on file  . Highest education level: Not on file  Occupational History  . Occupation: Research scientist (life sciences) Needs  . Financial resource strain: Not on file  . Food insecurity:    Worry: Not on file    Inability: Not on file  . Transportation needs:    Medical: Not on file    Non-medical: Not on file  Tobacco Use  . Smoking status: Former Smoker    Packs/day: 0.50    Years: 18.00    Pack years: 9.00    Types: Cigarettes    Last attempt to quit: 06/28/1999    Years since quitting: 18.4  . Smokeless tobacco: Never Used  Substance and Sexual Activity  . Alcohol use: Yes    Alcohol/week: 0.6 - 1.2 oz    Types: 1 - 2 Glasses of wine per week    Comment: 5 nights per week  . Drug use: No  . Sexual activity: Not on file  Lifestyle  . Physical activity:    Days per week: Not on file    Minutes per session:  Not on file  . Stress: Not on file  Relationships  . Social connections:    Talks on phone: Not on file    Gets together: Not on file    Attends religious service: Not on file    Active member of club or organization: Not on file    Attends meetings of clubs or organizations: Not on file    Relationship status: Not on file  Other Topics Concern  . Not on file  Social History Narrative  . Not on file  Family History: The patient's family history includes Colon cancer (age of onset: 73) in her sister; Diabetes in her brother, mother, and sister; Emphysema in her father; Hypertension in her brother and mother; Lung cancer in her sister.  ROS:   Review of Systems  Constitution: Positive for malaise/fatigue.  HENT: Positive for hearing loss.   Eyes: Negative.   Cardiovascular: Positive for palpitations. Negative for chest pain.  Respiratory: Positive for shortness of breath (with asthma).   Endocrine: Negative.   Hematologic/Lymphatic: Negative.   Skin: Positive for rash.  Musculoskeletal: Positive for joint pain.  Gastrointestinal: Negative.   Genitourinary: Negative.   Neurological: Negative.   Psychiatric/Behavioral: Negative.   Allergic/Immunologic: Negative.    Please see the history of present illness.     All other systems reviewed and are negative.  EKGs/Labs/Other Studies Reviewed:    The following studies were reviewed today:  EKG 11/14/17 Clarita short PR anteroseptal T inversion  Recent Labs: 11/14/2017: ALT 12; BUN 12; Creatinine, Ser 0.59; Hemoglobin 13.2; Platelets 434.0; Potassium 4.5; Sodium 140; TSH 2.27  Recent Lipid Panel    Component Value Date/Time   CHOL 239 (H) 10/28/2016 0934   TRIG 120.0 10/28/2016 0934   HDL 66.80 10/28/2016 0934   CHOLHDL 4 10/28/2016 0934   VLDL 24.0 10/28/2016 0934   LDLCALC 148 (H) 10/28/2016 0934   LDLDIRECT 161.6 03/23/2011 1331    Physical Exam:    VS:  Pulse 85   Ht 5\' 1"  (1.549 m)   Wt 174 lb 12.8 oz (79.3  kg)   SpO2 98%   BMI 33.03 kg/m     Wt Readings from Last 3 Encounters:  11/28/17 174 lb 12.8 oz (79.3 kg)  11/14/17 173 lb 12.8 oz (78.8 kg)  09/29/17 174 lb 4 oz (79 kg)     GEN:  Well nourished, well developed in no acute distress HEENT: Normal NECK: No JVD; No carotid bruits LYMPHATICS: No lymphadenopathy CARDIAC: RRR, no murmurs, rubs, gallops RESPIRATORY:  Clear to auscultation without rales, wheezing or rhonchi  ABDOMEN: Soft, non-tender, non-distended MUSCULOSKELETAL:  No edema; No deformity  SKIN: Warm and dry NEUROLOGIC:  Alert and oriented x 3 PSYCHIATRIC:  Normal affect     Signed, Shirlee More, MD  11/28/2017 3:38 PM    [facility]

## 2017-11-28 ENCOUNTER — Ambulatory Visit: Payer: BLUE CROSS/BLUE SHIELD | Admitting: Cardiology

## 2017-11-28 ENCOUNTER — Encounter: Payer: Self-pay | Admitting: Cardiology

## 2017-11-28 VITALS — HR 85 | Ht 61.0 in | Wt 174.8 lb

## 2017-11-28 DIAGNOSIS — R9431 Abnormal electrocardiogram [ECG] [EKG]: Secondary | ICD-10-CM | POA: Diagnosis not present

## 2017-11-28 DIAGNOSIS — R768 Other specified abnormal immunological findings in serum: Secondary | ICD-10-CM | POA: Diagnosis not present

## 2017-11-28 DIAGNOSIS — R002 Palpitations: Secondary | ICD-10-CM | POA: Diagnosis not present

## 2017-11-28 HISTORY — DX: Palpitations: R00.2

## 2017-11-28 HISTORY — DX: Other specified abnormal immunological findings in serum: R76.8

## 2017-11-28 NOTE — Patient Instructions (Addendum)
Medication Instructions:  Your physician recommends that you continue on your current medications as directed. Please refer to the Current Medication list given to you today.  Labwork: None  Testing/Procedures: Your physician has requested that you have an echocardiogram. Echocardiography is a painless test that uses sound waves to create images of your heart. It provides your doctor with information about the size and shape of your heart and how well your heart's chambers and valves are working. This procedure takes approximately one hour. There are no restrictions for this procedure.  Your physician has recommended that you wear a holter monitor. Holter monitors are medical devices that record the heart's electrical activity. Doctors most often use these monitors to diagnose arrhythmias. Arrhythmias are problems with the speed or rhythm of the heartbeat. The monitor is a small, portable device. You can wear one while you do your normal daily activities. This is usually used to diagnose what is causing palpitations/syncope (passing out). 14 days.  Follow-Up: Your physician recommends that you schedule a follow-up appointment in: 7 weeks.  Any Other Special Instructions Will Be Listed Below (If Applicable).     If you need a refill on your cardiac medications before your next appointment, please call your pharmacy.    1. Avoid all over-the-counter antihistamines except Claritin/Loratadine and Zyrtec/Cetrizine. 2. Avoid all combination including cold sinus allergies flu decongestant and sleep medications 3. You can use Robitussin DM Mucinex and Mucinex DM for cough. 4. can use Tylenol aspirin ibuprofen and naproxen but no combinations such as sleep or sinus.

## 2017-12-01 NOTE — Progress Notes (Signed)
Office Visit Note  Patient: Tina Williams             Date of Birth: 17-May-1961           MRN: [id number]             PCP: Midge Minium, MD Referring: Midge Minium, MD Visit Date: 12/15/2017 Occupation: Machinist    Subjective:  Pain in multiple joints and muscles..   History of Present Illness: Tina Williams is a 57 y.o. female seen in consultation per request of her PCP.  According to patient for the last 2 years she has been experiencing increased generalized pain.  She states the pain will come and go.  She has had several sets of labs with her PCP which were normal so far.  She states she has been having recurrent rash on her bilateral upper extremities for the last 2 years.  She states 2 months ago her symptoms got worse with increased pain in her bilateral hands and bilateral feet.  She denies any joint swelling.  She was also having some palpitation.  She had repeat labs by Dr. Birdie Riddle which showed positive ANA.  She was referred to me for further evaluation and also was referred to cardiology.  Activities of Daily Living:  Patient reports morning stiffness for 20 minutes.   Patient Denies nocturnal pain.  Difficulty dressing/grooming: Denies Difficulty climbing stairs: Denies Difficulty getting out of chair: Denies Difficulty using hands for taps, buttons, cutlery, and/or writing: Denies   Review of Systems  Constitutional: Positive for fatigue and weight gain. Negative for night sweats and weight loss.  HENT: Positive for mouth sores and mouth dryness. Negative for trouble swallowing, trouble swallowing and nose dryness.   Eyes: Positive for dryness. Negative for pain, redness and visual disturbance.  Respiratory: Negative for cough, shortness of breath and difficulty breathing.   Cardiovascular: Positive for palpitations. Negative for chest pain, hypertension, irregular heartbeat and swelling in legs/feet.  Gastrointestinal: Positive for constipation. Negative  for blood in stool and diarrhea.  Endocrine: Negative for increased urination.  Genitourinary: Negative for vaginal dryness.  Musculoskeletal: Positive for arthralgias, joint pain, myalgias, morning stiffness and myalgias. Negative for joint swelling, muscle weakness and muscle tenderness.  Skin: Positive for rash. Negative for color change, hair loss, skin tightness, ulcers and sensitivity to sunlight.  Allergic/Immunologic: Negative for susceptible to infections.  Neurological: Negative for dizziness, memory loss, night sweats and weakness.  Hematological: Negative for swollen glands.  Psychiatric/Behavioral: Positive for depressed mood. Negative for sleep disturbance. The patient is not nervous/anxious.     PMFS History:  Patient Active Problem List   Diagnosis Date Noted  . Palpitation 11/28/2017  . ANA positive 11/28/2017  . Fatigue 11/27/2017  . Mild persistent asthma without complication 15/40/0867  . Vertigo 09/27/2017  . Allergic rhinoconjunctivitis 09/27/2017  . Tinnitus of right ear 09/27/2017  . Vitamin D deficiency 10/28/2016  . Hx of adenomatous polyp of colon 01/11/2016  . Respiratory tract infection 03/04/2015  . H/O oralPollinosis 03/04/2015  . Oral allergy syndrome, subsequent encounter 03/04/2015  . Abdominal pain, epigastric 05/13/2014  . Moderate recurrent major depression ([place]) 01/06/2014  . Polyarthralgia 10/25/2012  . Hyperlipidemia 03/23/2011  . General medical examination 03/23/2011  . Personal history of colonic adenoma  11/26/2009  . MYALGIA 11/24/2009  . FOOT PAIN, BILATERAL 10/28/2009  . RHINITIS 10/09/2008  . ASTHMA 10/09/2008  . OBESITY 07/09/2008  . MENOPAUSE, EARLY 07/09/2008    Past Medical History:  Diagnosis Date  .  Abdominal pain, epigastric 05/13/2014  . Allergic rhinoconjunctivitis 09/27/2017  . Allergy   . Asthma   . ASTHMA 10/09/2008   HFA 75% p coaching  09/27/10    . Depression   . Early menopause   . Fatigue 11/27/2017  . FOOT  PAIN, BILATERAL 10/28/2009   Qualifier: Diagnosis of  By: Birdie Riddle MD, Belenda Cruise    . General medical examination 03/23/2011  . H/O oralPollinosis 03/04/2015  . Hx of adenomatous polyp of colon 01/11/2016  . Hyperlipidemia   . MENOPAUSE, EARLY 07/09/2008   Qualifier: Diagnosis of  By: Ronnald Ramp CMA, Chemira    . Mild persistent asthma without complication 1/0/2725  . Moderate recurrent major depression ([place]) 01/06/2014  . MYALGIA 11/24/2009   Qualifier: Diagnosis of  By: Birdie Riddle MD, Belenda Cruise    . Non-allergic rhinitis   . Obesity   . OBESITY 07/09/2008   Qualifier: Diagnosis of  By: Birdie Riddle MD, Belenda Cruise    . Oral allergy syndrome, subsequent encounter 03/04/2015  . Osteoarthritis   . Personal history of colonic adenoma  11/26/2009  . Polyarthralgia 10/25/2012  . Respiratory tract infection 03/04/2015  . RHINITIS 10/09/2008   Qualifier: Diagnosis of  By: Birdie Riddle MD, Belenda Cruise    . Sinusitis acute   . Tinnitus of right ear 09/27/2017  . URI (upper respiratory infection)   . Vertigo 09/27/2017  . Vitamin D deficiency 10/28/2016    Family History  Problem Relation Age of Onset  . Epilepsy Sister   . Colon cancer Sister 66  . Hypertension Mother   . Diabetes Mother   . Diabetes Brother   . Hypertension Brother   . Lung cancer Sister        was a smoker  . Diabetes Sister   . Emphysema Father        was a smoker  . Healthy Son   . Healthy Son   . Heart Problems Son        age 19  . Healthy Son    Past Surgical History:  Procedure Laterality Date  . CESAREAN SECTION  1988  . KNEE SURGERY Right 1998  . [place] EXTRACTION  1984   Social History   Social History Narrative  . Not on file     Objective: Vital Signs: BP 131/79 (BP Location: Left Arm, Patient Position: Sitting, Cuff Size: Normal)   Pulse 80   Resp 13   Ht 5' 0.63" (1.54 m)   Wt 174 lb (78.9 kg)   BMI 33.28 kg/m    Physical Exam  Constitutional: She is oriented to person, place, and time. She appears well-developed and  well-nourished.  HENT:  Head: Normocephalic and atraumatic.  Eyes: Conjunctivae and EOM are normal.  Neck: Normal range of motion.  Cardiovascular: Normal rate, regular rhythm, normal heart sounds and intact distal pulses.  Pulmonary/Chest: Effort normal and breath sounds normal.  Abdominal: Soft. Bowel sounds are normal.  Lymphadenopathy:    She has no cervical adenopathy.  Neurological: She is alert and oriented to person, place, and time.  Skin: Skin is warm and dry. Capillary refill takes less than 2 seconds. There is erythema.  Erythema noted over bilateral upper extremities.  Psychiatric: She has a normal mood and affect. Her behavior is normal.  Nursing note and vitals reviewed.    Musculoskeletal Exam: C-spine thoracic lumbar spine good range of motion.  Shoulder joints elbow joints wrist joint MCPs PIPs DIPs were in good range of motion with no synovitis.  Hip joints knee  joints ankles MTPs PIPs DIPs been good range of motion with no synovitis.  She has tenderness across her PIPs of her hands and also of her feet.  CDAI Exam: No CDAI exam completed.    Investigation: Findings:  11/14/17: ANA 1:40 Nucleolar, vitamin D 87.40, Folate 11.3, Vitamin B12 >1500, Hep panel WNL, TSH 2.27   Component     Latest Ref Rng & Units 11/14/2017  Vitamin B12     211 - 911 pg/mL >1500 (H)  Folate     >5.9 ng/mL 11.3  ANA Pattern 1      NUCLEOLAR (A)  ANA Titer 1     titer 1:40 (H)  VITD     30.00 - 100.00 ng/mL 87.40  Anti Nuclear Antibody(ANA)     NEGATIVE POSITIVE (A)   Component     Latest Ref Rng & Units 11/14/2017  Total Bilirubin     0.2 - 1.2 mg/dL 0.6  Bilirubin, Direct     0.0 - 0.3 mg/dL 0.1  Alkaline Phosphatase     39 - 117 U/L 90  AST     0 - 37 U/L 12  ALT     0 - 35 U/L 12  Total Protein     6.0 - 8.3 g/dL 7.0  Albumin     3.5 - 5.2 g/dL 4.4  TSH     0.35 - 4.50 uIU/mL 2.27   CBC Latest Ref Rng & Units 11/14/2017 01/17/2017 01/10/2017  WBC 4.0 - 10.5 K/uL  6.9 9.2 20.9(H)  Hemoglobin 12.0 - 15.0 g/dL 13.2 12.9 14.7  Hematocrit 36.0 - 46.0 % 40.4 39.7 45.9(H)  Platelets 150.0 - 400.0 K/uL 434.0(H) 429.0(H) 568(H)   CMP Latest Ref Rng & Units 11/14/2017 10/28/2016 10/23/2015  Glucose 70 - 99 mg/dL 105(H) 87 92  BUN 6 - 23 mg/dL 12 11 11   Creatinine 0.40 - 1.20 mg/dL 0.59 0.67 0.58  Sodium 135 - 145 mEq/L 140 141 139  Potassium 3.5 - 5.1 mEq/L 4.5 4.7 3.6  Chloride 96 - 112 mEq/L 102 103 104  CO2 19 - 32 mEq/L 31 31 29   Calcium 8.4 - 10.5 mg/dL 9.7 9.6 9.2  Total Protein 6.0 - 8.3 g/dL 7.0 6.7 7.1  Total Bilirubin 0.2 - 1.2 mg/dL 0.6 0.9 0.7  Alkaline Phos 39 - 117 U/L 90 82 70  AST 0 - 37 U/L 12 12 14   ALT 0 - 35 U/L 12 13 13      Imaging: Xr Foot 2 Views Left  Result Date: 12/15/2017 First MTP, all PIP and DIP narrowing was noted.  None of the other MTP showed any narrowing or erosive changes.  No intertarsal joint space narrowing was noted. Impression: Mild osteoarthritis of the foot.  Xr Foot 2 Views Right  Result Date: 12/15/2017 First MTP, all PIP and DIP narrowing was noted.  No intertarsal joint space narrowing was noted.  Small calcaneal spur was noted. Impression: These findings are consistent with mild osteoarthritis of the foot.  Xr Hand 2 View Left  Result Date: 12/15/2017 Minimal PIP narrowing was noted.  No MCP intercarpal or radiocarpal changes were noted.  Some cystic changes were noted in the ulna. Impression: Unremarkable x-ray of the hand.  Xr Hand 2 View Right  Result Date: 12/15/2017 Minimal PIP narrowing was noted.  No MCP or intercarpal joint space narrowing was noted.  No radiocarpal joint space narrowing was noted.  Some cystic changes were noted were noted on left. Impression: These findings are  consistent with osteoarthritis.   Speciality Comments: No specialty comments available.    Procedures:  No procedures performed Allergies: Latex; Amoxicillin; Clarithromycin; Naproxen sodium; Penicillins; and  Sulfa antibiotics   Assessment / Plan:     Visit Diagnoses: Positive ANA (antinuclear antibody) - 1:40 Nucleolar on 11/14/17 -patient has no clinical features of autoimmune disease.  She is very much concerned about possible autoimmune disease due to rash and arthralgias.  I will obtain following labs today including AVISE.  She gives history of fatigue and photosensitivity.  Plan: CK, Sedimentation rate, Serum protein electrophoresis with reflex, Pan-ANCA  Polyarthralgia -she complains of generalized pain in multiple joints especially in her hands and feet.  Bilateral hand pain -she had tenderness over his bilateral PIPs.  No synovitis was noted.  Plan: XR Hand 2 View Right, XR Hand 2 View Left  Bilateral foot pain -she has tenderness over bilateral PIPs of her feet without any synovitis.  She has difficulty walking.  Plan: XR Foot 2 Views Right, XR Foot 2 Views Left  Myalgia-gives history of generalized myalgias.  She had few tender points and some hyperalgesia.  This raises concern of myofascial pain syndrome.  Rash - Plan: Ambulatory referral to Dermatology.  Plan pan ANCA.  Other medical problems are listed as follows:  Mild persistent asthma without complication  Allergic rhinoconjunctivitis  Vitamin D deficiency  Personal history of colonic adenoma   Moderate recurrent major depression ([place])  History of hyperlipidemia  History of vertigo  History of palpitations    Orders: Orders Placed This Encounter  Procedures  . XR Hand 2 View Right  . XR Hand 2 View Left  . XR Foot 2 Views Right  . XR Foot 2 Views Left  . CK  . Sedimentation rate  . Serum protein electrophoresis with reflex  . Pan-ANCA  . Ambulatory referral to Dermatology   No orders of the defined types were placed in this encounter.   Face-to-face time spent with patient was 50 minutes. >50% of time was spent in counseling and coordination of care.  Follow-Up Instructions: Return for Polyarthralgia,  myalgia.   Bo Merino, MD  Note - This record has been created using Editor, commissioning.  Chart creation errors have been sought, but may not always  have been located. Such creation errors do not reflect on  the standard of medical care.

## 2017-12-07 ENCOUNTER — Ambulatory Visit (INDEPENDENT_AMBULATORY_CARE_PROVIDER_SITE_OTHER): Payer: BLUE CROSS/BLUE SHIELD | Admitting: *Deleted

## 2017-12-07 DIAGNOSIS — J309 Allergic rhinitis, unspecified: Secondary | ICD-10-CM | POA: Diagnosis not present

## 2017-12-14 ENCOUNTER — Ambulatory Visit: Payer: BLUE CROSS/BLUE SHIELD | Admitting: Allergy and Immunology

## 2017-12-15 ENCOUNTER — Ambulatory Visit (INDEPENDENT_AMBULATORY_CARE_PROVIDER_SITE_OTHER): Payer: Self-pay

## 2017-12-15 ENCOUNTER — Encounter: Payer: Self-pay | Admitting: Rheumatology

## 2017-12-15 ENCOUNTER — Ambulatory Visit: Payer: BLUE CROSS/BLUE SHIELD | Admitting: Rheumatology

## 2017-12-15 VITALS — BP 131/79 | HR 80 | Resp 13 | Ht 60.63 in | Wt 174.0 lb

## 2017-12-15 DIAGNOSIS — M2550 Pain in unspecified joint: Secondary | ICD-10-CM | POA: Diagnosis not present

## 2017-12-15 DIAGNOSIS — H1010 Acute atopic conjunctivitis, unspecified eye: Secondary | ICD-10-CM

## 2017-12-15 DIAGNOSIS — Z860100 Personal history of colon polyps, unspecified: Secondary | ICD-10-CM

## 2017-12-15 DIAGNOSIS — Z87898 Personal history of other specified conditions: Secondary | ICD-10-CM

## 2017-12-15 DIAGNOSIS — E559 Vitamin D deficiency, unspecified: Secondary | ICD-10-CM | POA: Diagnosis not present

## 2017-12-15 DIAGNOSIS — M79641 Pain in right hand: Secondary | ICD-10-CM

## 2017-12-15 DIAGNOSIS — J4530 Mild persistent asthma, uncomplicated: Secondary | ICD-10-CM | POA: Diagnosis not present

## 2017-12-15 DIAGNOSIS — F331 Major depressive disorder, recurrent, moderate: Secondary | ICD-10-CM

## 2017-12-15 DIAGNOSIS — J309 Allergic rhinitis, unspecified: Secondary | ICD-10-CM | POA: Diagnosis not present

## 2017-12-15 DIAGNOSIS — R768 Other specified abnormal immunological findings in serum: Secondary | ICD-10-CM

## 2017-12-15 DIAGNOSIS — R21 Rash and other nonspecific skin eruption: Secondary | ICD-10-CM | POA: Diagnosis not present

## 2017-12-15 DIAGNOSIS — Z8639 Personal history of other endocrine, nutritional and metabolic disease: Secondary | ICD-10-CM

## 2017-12-15 DIAGNOSIS — M79672 Pain in left foot: Secondary | ICD-10-CM

## 2017-12-15 DIAGNOSIS — M79671 Pain in right foot: Secondary | ICD-10-CM | POA: Diagnosis not present

## 2017-12-15 DIAGNOSIS — M79642 Pain in left hand: Secondary | ICD-10-CM

## 2017-12-15 DIAGNOSIS — M791 Myalgia, unspecified site: Secondary | ICD-10-CM

## 2017-12-18 LAB — PROTEIN ELECTROPHORESIS, SERUM, WITH REFLEX
ALBUMIN ELP: 4.4 g/dL (ref 3.8–4.8)
ALPHA 1: 0.3 g/dL (ref 0.2–0.3)
ALPHA 2: 0.7 g/dL (ref 0.5–0.9)
BETA 2: 0.3 g/dL (ref 0.2–0.5)
BETA GLOBULIN: 0.4 g/dL (ref 0.4–0.6)
Gamma Globulin: 0.8 g/dL (ref 0.8–1.7)
Total Protein: 6.9 g/dL (ref 6.1–8.1)

## 2017-12-18 LAB — PAN-ANCA
ANCA Screen: NEGATIVE
Serine Protease 3: <1 AI

## 2017-12-18 LAB — CK: Total CK: 59 U/L (ref 29–143)

## 2017-12-18 LAB — SEDIMENTATION RATE: Sed Rate: 11 mm/h (ref 0–30)

## 2017-12-20 ENCOUNTER — Ambulatory Visit: Payer: BLUE CROSS/BLUE SHIELD

## 2017-12-20 ENCOUNTER — Ambulatory Visit (HOSPITAL_BASED_OUTPATIENT_CLINIC_OR_DEPARTMENT_OTHER)
Admission: RE | Admit: 2017-12-20 | Discharge: 2017-12-20 | Disposition: A | Discharge status: Home / Self Care | Payer: BLUE CROSS/BLUE SHIELD | Source: Ambulatory Visit | Attending: Cardiology | Admitting: Cardiology

## 2017-12-20 DIAGNOSIS — R9431 Abnormal electrocardiogram [ECG] [EKG]: Secondary | ICD-10-CM

## 2017-12-20 DIAGNOSIS — I10 Essential (primary) hypertension: Secondary | ICD-10-CM | POA: Insufficient documentation

## 2017-12-20 DIAGNOSIS — R002 Palpitations: Secondary | ICD-10-CM

## 2017-12-20 DIAGNOSIS — E119 Type 2 diabetes mellitus without complications: Secondary | ICD-10-CM | POA: Diagnosis not present

## 2017-12-20 DIAGNOSIS — R079 Chest pain, unspecified: Secondary | ICD-10-CM | POA: Diagnosis not present

## 2017-12-20 NOTE — Progress Notes (Signed)
  Echocardiogram 2D Echocardiogram has been performed.  Joelene Millin 12/20/2017, 12:01 PM

## 2018-01-01 ENCOUNTER — Ambulatory Visit (INDEPENDENT_AMBULATORY_CARE_PROVIDER_SITE_OTHER): Payer: BLUE CROSS/BLUE SHIELD | Admitting: *Deleted

## 2018-01-01 DIAGNOSIS — J309 Allergic rhinitis, unspecified: Secondary | ICD-10-CM

## 2018-01-02 NOTE — Progress Notes (Signed)
Office Visit Note  Patient: Tina Williams             Date of Birth: 1961-01-18           MRN: [id number]             PCP: Midge Minium, MD Referring: Midge Minium, MD Visit Date: 01/03/2018 Occupation: _0 @  Subjective:  Pain in both hands and feet.   History of Present Illness: Tina Williams is a 57 y.o. female history of pain in multiple joints.  She states she continues to have pain and stiffness in her bilateral hands.  She has difficulty driving.  She also experiences nocturnal pain sometimes in her wrist.  She continues to have discomfort in her feet.  She has been feeling tired.  Continues to have generalized muscle ache.  He continues to have recurrent rash.  Her appointment with dermatologist is in September.  Activities of Daily Living:  Patient reports morning stiffness for 45 minutes.   Patient Denies nocturnal pain.  Difficulty dressing/grooming: Denies Difficulty climbing stairs: Denies Difficulty getting out of chair: Denies Difficulty using hands for taps, buttons, cutlery, and/or writing: Reports  Review of Systems  Constitutional: Positive for fatigue. Negative for activity change, night sweats, weight gain and weight loss.  HENT: Positive for mouth dryness. Negative for mouth sores, trouble swallowing, trouble swallowing and nose dryness.   Eyes: Positive for dryness. Negative for pain, discharge, redness and visual disturbance.  Respiratory: Positive for shortness of breath. Negative for cough and difficulty breathing.   Cardiovascular: Negative for chest pain, palpitations, hypertension, irregular heartbeat and swelling in legs/feet.  Gastrointestinal: Positive for constipation. Negative for blood in stool and diarrhea.  Endocrine: Negative for cold intolerance and increased urination.  Genitourinary: Negative for difficulty urinating and vaginal dryness.  Musculoskeletal: Positive for arthralgias, joint pain, muscle weakness, morning  stiffness and muscle tenderness. Negative for joint swelling, myalgias and myalgias.  Skin: Positive for rash. Negative for color change, hair loss, skin tightness, ulcers and sensitivity to sunlight.  Allergic/Immunologic: Negative for susceptible to infections.  Neurological: Negative for dizziness, numbness, memory loss, night sweats and weakness.  Hematological: Negative for bruising/bleeding tendency and swollen glands.  Psychiatric/Behavioral: Positive for sleep disturbance. Negative for depressed mood. The patient is not nervous/anxious.     PMFS History:  Patient Active Problem List   Diagnosis Date Noted  . Palpitation 11/28/2017  . ANA positive 11/28/2017  . Fatigue 11/27/2017  . Mild persistent asthma without complication 02/07/4817  . Vertigo 09/27/2017  . Allergic rhinoconjunctivitis 09/27/2017  . Tinnitus of right ear 09/27/2017  . Vitamin D deficiency 10/28/2016  . Hx of adenomatous polyp of colon 01/11/2016  . Respiratory tract infection 03/04/2015  . H/O oralPollinosis 03/04/2015  . Oral allergy syndrome, subsequent encounter 03/04/2015  . Abdominal pain, epigastric 05/13/2014  . Moderate recurrent major depression ([place]) 01/06/2014  . Polyarthralgia 10/25/2012  . Hyperlipidemia 03/23/2011  . General medical examination 03/23/2011  . Personal history of colonic adenoma  11/26/2009  . MYALGIA 11/24/2009  . FOOT PAIN, BILATERAL 10/28/2009  . RHINITIS 10/09/2008  . ASTHMA 10/09/2008  . OBESITY 07/09/2008  . MENOPAUSE, EARLY 07/09/2008    Past Medical History:  Diagnosis Date  . Abdominal pain, epigastric 05/13/2014  . Allergic rhinoconjunctivitis 09/27/2017  . Allergy   . Asthma   . ASTHMA 10/09/2008   HFA 75% p coaching  09/27/10    . Depression   . Early menopause   . Fatigue 11/27/2017  .  FOOT PAIN, BILATERAL 10/28/2009   Qualifier: Diagnosis of  By: Birdie Riddle MD, Belenda Cruise    . General medical examination 03/23/2011  . H/O oralPollinosis 03/04/2015  . Hx of  adenomatous polyp of colon 01/11/2016  . Hyperlipidemia   . MENOPAUSE, EARLY 07/09/2008   Qualifier: Diagnosis of  By: Ronnald Ramp CMA, Chemira    . Mild persistent asthma without complication 11/26/8313  . Moderate recurrent major depression ([place]) 01/06/2014  . MYALGIA 11/24/2009   Qualifier: Diagnosis of  By: Birdie Riddle MD, Belenda Cruise    . Non-allergic rhinitis   . Obesity   . OBESITY 07/09/2008   Qualifier: Diagnosis of  By: Birdie Riddle MD, Belenda Cruise    . Oral allergy syndrome, subsequent encounter 03/04/2015  . Osteoarthritis   . Personal history of colonic adenoma  11/26/2009  . Polyarthralgia 10/25/2012  . Respiratory tract infection 03/04/2015  . RHINITIS 10/09/2008   Qualifier: Diagnosis of  By: Birdie Riddle MD, Belenda Cruise    . Sinusitis acute   . Tinnitus of right ear 09/27/2017  . URI (upper respiratory infection)   . Vertigo 09/27/2017  . Vitamin D deficiency 10/28/2016    Family History  Problem Relation Age of Onset  . Epilepsy Sister   . Colon cancer Sister 40  . Hypertension Mother   . Diabetes Mother   . Diabetes Brother   . Hypertension Brother   . Lung cancer Sister        was a smoker  . Diabetes Sister   . Emphysema Father        was a smoker  . Healthy Son   . Healthy Son   . Heart Problems Son        age 24  . Healthy Son    Past Surgical History:  Procedure Laterality Date  . CESAREAN SECTION  1988  . KNEE ARTHROPLASTY    . KNEE SURGERY Right 1998  . [place] EXTRACTION  1984   Social History   Social History Narrative  . Not on file    Objective: Vital Signs: BP 119/66 (BP Location: Right Arm, Patient Position: Sitting, Cuff Size: Normal)   Pulse 81   Resp 16   Ht 5' 1" (1.549 m)   Wt 175 lb (79.4 kg)   BMI 33.07 kg/m    Physical Exam  Constitutional: She is oriented to person, place, and time. She appears well-developed and well-nourished.  HENT:  Head: Normocephalic and atraumatic.  Eyes: Conjunctivae and EOM are normal.  Neck: Normal range of motion.    Cardiovascular: Normal rate, regular rhythm, normal heart sounds and intact distal pulses.  Pulmonary/Chest: Effort normal and breath sounds normal.  Abdominal: Soft. Bowel sounds are normal.  Lymphadenopathy:    She has no cervical adenopathy.  Neurological: She is alert and oriented to person, place, and time.  Skin: Skin is warm and dry. Capillary refill takes less than 2 seconds.  Erythematous macular rash present over bilateral arms  Psychiatric: She has a normal mood and affect. Her behavior is normal.  Nursing note and vitals reviewed.    Musculoskeletal Exam: C-spine thoracic lumbar spine good range of motion.  Shoulder joints elbow joints wrist joint MCPs PIPs DIPs with good range of motion.  She has some thickening of PIP and DIP joints consistent with osteoarthritis.  Tinel's, Phalen's and manual compression test are positive for carpal tunnel syndrome.  Hip joints knee joints ankles MTPs PIPs were in good range of motion.  She has bilateral first MTP thickening consistent with osteoarthritis.  No synovitis was noted.  She has some generalized hyperalgesia.  CDAI Exam: No CDAI exam completed.   Investigation: Findings:  June 21st 2019 AVISE lupus resolved index: -1.7 (ANA negative, dsDNA negative, Smith negative, SSA and SSB negative, SCL 70-, Jo 1-, RNP negative, CB CAP negative RF negative, anti-CCP negative, thyroid antibodies negative, anticardiolipin negative, beta-2 GP 1-)  December 15, 2017 SPEP negative, ANCA negative, CK 59, ESR 11 Imaging: [place] Extrem Up Bilat Comp  Result Date: 01/03/2018 Ultrasound examination of bilateral hands was performed per EULAR recommendations. Using 12 MHz transducer, grayscale and power Doppler bilateral second, third, and fifth MCP joints and bilateral wrist joints both dorsal and volar aspects were evaluated to look for synovitis or tenosynovitis. The findings were there was no synovitis or tenosynovitis on ultrasound examination. Right median  nerve was 0.13 cm squares which was more than upper limits of normal and left median nerve was 0.13 cm squares which was more than upper limits of normal. Impression: Ultrasound examination did not show inflammatory arthritis. Bilateral median nerves were enlarged.  Xr Foot 2 Views Left  Result Date: 12/15/2017 First MTP, all PIP and DIP narrowing was noted.  None of the other MTP showed any narrowing or erosive changes.  No intertarsal joint space narrowing was noted. Impression: Mild osteoarthritis of the foot.  Xr Foot 2 Views Right  Result Date: 12/15/2017 First MTP, all PIP and DIP narrowing was noted.  No intertarsal joint space narrowing was noted.  Small calcaneal spur was noted. Impression: These findings are consistent with mild osteoarthritis of the foot.  Xr Hand 2 View Left  Result Date: 12/15/2017 Minimal PIP narrowing was noted.  No MCP intercarpal or radiocarpal changes were noted.  Some cystic changes were noted in the ulna. Impression: Unremarkable x-ray of the hand.  Xr Hand 2 View Right  Result Date: 12/15/2017 Minimal PIP narrowing was noted.  No MCP or intercarpal joint space narrowing was noted.  No radiocarpal joint space narrowing was noted.  Some cystic changes were noted were noted on left. Impression: These findings are consistent with osteoarthritis.   Recent Labs: Lab Results  Component Value Date   WBC 6.9 11/14/2017   HGB 13.2 11/14/2017   PLT 434.0 (H) 11/14/2017   NA 140 11/14/2017   K 4.5 11/14/2017   CL 102 11/14/2017   CO2 31 11/14/2017   GLUCOSE 105 (H) 11/14/2017   BUN 12 11/14/2017   CREATININE 0.59 11/14/2017   BILITOT 0.6 11/14/2017   ALKPHOS 90 11/14/2017   AST 12 11/14/2017   ALT 12 11/14/2017   PROT 6.9 12/15/2017   ALBUMIN 4.4 11/14/2017   CALCIUM 9.7 11/14/2017    Speciality Comments: No specialty comments available.  Procedures:  No procedures performed Allergies: Latex; Amoxicillin; Clarithromycin; Naproxen sodium;  Penicillins; and Sulfa antibiotics   Assessment / Plan:     Visit Diagnoses: Primary osteoarthritis of both hands - mild.  Patient continues to have discomfort in her bilateral hands.  I do not see any synovitis on examination.  Ultrasound examination was also negative for synovitis.  Joint protection muscle strengthening was discussed.  I have given her a handout on hand exercises.  A list of natural anti-inflammatories was given.  I have given her prescription for Voltaren gel which can be used topically.  Side effects were reviewed.  She had hives with Naprosyn in the past but she has used Advil without any problems.  Primary osteoarthritis of both feet - mild.  She continues  to have some discomfort in her feet.  Proper fitting shoes were discussed.  Pain in both hands - All autoimmune work-up negative, ultrasound negative for synovitis. - Plan: [place] Extrem Up Bilat Comp  Paresthesia hands-she had positive manual compression test and Tinel's test.  I will schedule nerve conduction velocity of bilateral hands to look for extent of carpal tunnel syndrome.  Myofascial pain-she has some fatigue, myalgia and hyperalgesia.  Rash - Patient was referred to dermatology.  She has appointment in September.  Other medical problems are listed as follows:  Mild persistent asthma without complication  Allergic rhinoconjunctivitis  Vitamin D deficiency  Personal history of colonic adenoma   Moderate recurrent major depression ([place])  History of hyperlipidemia  History of vertigo  History of palpitations    Orders: Orders Placed This Encounter  Procedures  . [place] Extrem Up Bilat Comp  . Ambulatory referral to Physical Medicine Rehab   Meds ordered this encounter  Medications  . diclofenac sodium (VOLTAREN) 1 % GEL    Sig: Apply 3 gm to 3 large joints up to 3 times a day.Dispense 3 tubes with 3 refills.    Dispense:  3 Tube    Refill:  0    Face-to-face time spent with patient was  30 minutes. Greater than 50% of time was spent in counseling and coordination of care.  Follow-Up Instructions: Return in about 3 months (around 04/05/2018) for Osteoarthritis.   Bo Merino, MD  Note - This record has been created using Editor, commissioning.  Chart creation errors have been sought, but may not always  have been located. Such creation errors do not reflect on  the standard of medical care.

## 2018-01-02 NOTE — Progress Notes (Deleted)
Office Visit Note  Patient: Tina Williams             Date of Birth: January 30, 1961           MRN: [id number]             PCP: Midge Minium, MD Referring: Midge Minium, MD Visit Date: 01/15/2018 Occupation: '@GUAROCC'$ @    Subjective:  No chief complaint on file.   History of Present Illness: Tina Williams is a 57 y.o. female ***   Activities of Daily Living:  Patient reports morning stiffness for *** {minute/hour:19697}.   Patient {ACTIONS;DENIES/REPORTS:21021675::"Denies"} nocturnal pain.  Difficulty dressing/grooming: {ACTIONS;DENIES/REPORTS:21021675::"Denies"} Difficulty climbing stairs: {ACTIONS;DENIES/REPORTS:21021675::"Denies"} Difficulty getting out of chair: {ACTIONS;DENIES/REPORTS:21021675::"Denies"} Difficulty using hands for taps, buttons, cutlery, and/or writing: {ACTIONS;DENIES/REPORTS:21021675::"Denies"}   No Rheumatology ROS completed.   PMFS History:  Patient Active Problem List   Diagnosis Date Noted  . Palpitation 11/28/2017  . ANA positive 11/28/2017  . Fatigue 11/27/2017  . Mild persistent asthma without complication 51/76/1607  . Vertigo 09/27/2017  . Allergic rhinoconjunctivitis 09/27/2017  . Tinnitus of right ear 09/27/2017  . Vitamin D deficiency 10/28/2016  . Hx of adenomatous polyp of colon 01/11/2016  . Respiratory tract infection 03/04/2015  . H/O oralPollinosis 03/04/2015  . Oral allergy syndrome, subsequent encounter 03/04/2015  . Abdominal pain, epigastric 05/13/2014  . Moderate recurrent major depression ([place]) 01/06/2014  . Polyarthralgia 10/25/2012  . Hyperlipidemia 03/23/2011  . General medical examination 03/23/2011  . Personal history of colonic adenoma  11/26/2009  . MYALGIA 11/24/2009  . FOOT PAIN, BILATERAL 10/28/2009  . RHINITIS 10/09/2008  . ASTHMA 10/09/2008  . OBESITY 07/09/2008  . MENOPAUSE, EARLY 07/09/2008    Past Medical History:  Diagnosis Date  . Abdominal pain, epigastric 05/13/2014  . Allergic  rhinoconjunctivitis 09/27/2017  . Allergy   . Asthma   . ASTHMA 10/09/2008   HFA 75% p coaching  09/27/10    . Depression   . Early menopause   . Fatigue 11/27/2017  . FOOT PAIN, BILATERAL 10/28/2009   Qualifier: Diagnosis of  By: Birdie Riddle MD, Belenda Cruise    . General medical examination 03/23/2011  . H/O oralPollinosis 03/04/2015  . Hx of adenomatous polyp of colon 01/11/2016  . Hyperlipidemia   . MENOPAUSE, EARLY 07/09/2008   Qualifier: Diagnosis of  By: Ronnald Ramp CMA, Chemira    . Mild persistent asthma without complication 08/31/1060  . Moderate recurrent major depression ([place]) 01/06/2014  . MYALGIA 11/24/2009   Qualifier: Diagnosis of  By: Birdie Riddle MD, Belenda Cruise    . Non-allergic rhinitis   . Obesity   . OBESITY 07/09/2008   Qualifier: Diagnosis of  By: Birdie Riddle MD, Belenda Cruise    . Oral allergy syndrome, subsequent encounter 03/04/2015  . Osteoarthritis   . Personal history of colonic adenoma  11/26/2009  . Polyarthralgia 10/25/2012  . Respiratory tract infection 03/04/2015  . RHINITIS 10/09/2008   Qualifier: Diagnosis of  By: Birdie Riddle MD, Belenda Cruise    . Sinusitis acute   . Tinnitus of right ear 09/27/2017  . URI (upper respiratory infection)   . Vertigo 09/27/2017  . Vitamin D deficiency 10/28/2016    Family History  Problem Relation Age of Onset  . Epilepsy Sister   . Colon cancer Sister 12  . Hypertension Mother   . Diabetes Mother   . Diabetes Brother   . Hypertension Brother   . Lung cancer Sister        was a smoker  . Diabetes Sister   . Emphysema Father  was a smoker  . Healthy Son   . Healthy Son   . Heart Problems Son        age 21  . Healthy Son    Past Surgical History:  Procedure Laterality Date  . CESAREAN SECTION  1988  . KNEE SURGERY Right 1998  . [place] EXTRACTION  1984   Social History   Social History Narrative  . Not on file     Objective: Vital Signs: There were no vitals taken for this visit.   Physical Exam   Musculoskeletal Exam: ***  CDAI Exam: No  CDAI exam completed.    Investigation: No additional findings. December 15, 2017 CK 59, ESR 11, ANCA negative, SPEP normal Nov 14, 2017 CBC normal, LFTs normal by E70 high, folic acid normal, ANA 1: 40 nucleolar, vitamin D normal  Imaging: Xr Foot 2 Views Left  Result Date: 12/15/2017 First MTP, all PIP and DIP narrowing was noted.  None of the other MTP showed any narrowing or erosive changes.  No intertarsal joint space narrowing was noted. Impression: Mild osteoarthritis of the foot.  Xr Foot 2 Views Right  Result Date: 12/15/2017 First MTP, all PIP and DIP narrowing was noted.  No intertarsal joint space narrowing was noted.  Small calcaneal spur was noted. Impression: These findings are consistent with mild osteoarthritis of the foot.  Xr Hand 2 View Left  Result Date: 12/15/2017 Minimal PIP narrowing was noted.  No MCP intercarpal or radiocarpal changes were noted.  Some cystic changes were noted in the ulna. Impression: Unremarkable x-ray of the hand.  Xr Hand 2 View Right  Result Date: 12/15/2017 Minimal PIP narrowing was noted.  No MCP or intercarpal joint space narrowing was noted.  No radiocarpal joint space narrowing was noted.  Some cystic changes were noted were noted on left. Impression: These findings are consistent with osteoarthritis.   Speciality Comments: No specialty comments available.    Procedures:  No procedures performed Allergies: Latex; Amoxicillin; Clarithromycin; Naproxen sodium; Penicillins; and Sulfa antibiotics   Assessment / Plan:     Visit Diagnoses: ANA positive - 1: 40 nucleolar.  No clinical features of autoimmune disease.AVISE   Primary osteoarthritis of both hands - Bilateral mild  Primary osteoarthritis of both feet - Bilateral mild  Myalgia  Rash - Patient was referred to dermatology.   Other medical problems are listed as follows:  Mild persistent asthma without complication  Allergic rhinoconjunctivitis  Vitamin D  deficiency  Personal history of colonic adenoma   Moderate recurrent major depression ([place])  History of hyperlipidemia  History of vertigo  History of palpitations     Orders: No orders of the defined types were placed in this encounter.  No orders of the defined types were placed in this encounter.   Face-to-face time spent with patient was *** minutes. Greater than 50% of time was spent in counseling and coordination of care.  Follow-Up Instructions: No follow-ups on file.   Bo Merino, MD  Note - This record has been created using Editor, commissioning.  Chart creation errors have been sought, but may not always  have been located. Such creation errors do not reflect on  the standard of medical care.

## 2018-01-03 ENCOUNTER — Encounter: Payer: Self-pay | Admitting: Rheumatology

## 2018-01-03 ENCOUNTER — Ambulatory Visit (INDEPENDENT_AMBULATORY_CARE_PROVIDER_SITE_OTHER): Payer: Self-pay

## 2018-01-03 ENCOUNTER — Ambulatory Visit: Payer: BLUE CROSS/BLUE SHIELD | Admitting: Rheumatology

## 2018-01-03 VITALS — BP 119/66 | HR 81 | Resp 16 | Ht 61.0 in | Wt 175.0 lb

## 2018-01-03 DIAGNOSIS — R202 Paresthesia of skin: Secondary | ICD-10-CM | POA: Diagnosis not present

## 2018-01-03 DIAGNOSIS — R21 Rash and other nonspecific skin eruption: Secondary | ICD-10-CM | POA: Diagnosis not present

## 2018-01-03 DIAGNOSIS — M19042 Primary osteoarthritis, left hand: Secondary | ICD-10-CM

## 2018-01-03 DIAGNOSIS — M79641 Pain in right hand: Secondary | ICD-10-CM

## 2018-01-03 DIAGNOSIS — M19071 Primary osteoarthritis, right ankle and foot: Secondary | ICD-10-CM | POA: Diagnosis not present

## 2018-01-03 DIAGNOSIS — M7918 Myalgia, other site: Secondary | ICD-10-CM

## 2018-01-03 DIAGNOSIS — M19041 Primary osteoarthritis, right hand: Secondary | ICD-10-CM | POA: Diagnosis not present

## 2018-01-03 DIAGNOSIS — M19072 Primary osteoarthritis, left ankle and foot: Secondary | ICD-10-CM

## 2018-01-03 DIAGNOSIS — M79642 Pain in left hand: Secondary | ICD-10-CM

## 2018-01-03 MED ORDER — DICLOFENAC SODIUM 1 % TD GEL: TRANSDERMAL | 0 refills | Status: DC

## 2018-01-03 NOTE — Patient Instructions (Addendum)
Hand Exercises Hand exercises can be helpful to almost anyone. These exercises can strengthen the hands, improve flexibility and movement, and increase blood flow to the hands. These results can make work and daily tasks easier. Hand exercises can be especially helpful for people who have joint pain from arthritis or have nerve damage from overuse (carpal tunnel syndrome). These exercises can also help people who have injured a hand. Most of these hand exercises are fairly gentle stretching routines. You can do them often throughout the day. Still, it is a good idea to ask your health care provider which exercises would be best for you. Warming your hands before exercise may help to reduce stiffness. You can do this with gentle massage or by placing your hands in warm water for 15 minutes. Also, make sure you pay attention to your level of hand pain as you begin an exercise routine. Exercises Knuckle Bend Repeat this exercise 5-10 times with each hand. 1. Stand or sit with your arm, hand, and all five fingers pointed straight up. Make sure your wrist is straight. 2. Gently and slowly bend your fingers down and inward until the tips of your fingers are touching the tops of your palm. 3. Hold this position for a few seconds. 4. Extend your fingers out to their original position, all pointing straight up again.  Finger Fan Repeat this exercise 5-10 times with each hand. 1. Hold your arm and hand out in front of you. Keep your wrist straight. 2. Squeeze your hand into a fist. 3. Hold this position for a few seconds. 4. Edison Simon out, or spread apart, your hand and fingers as much as possible, stretching every joint fully.  Tabletop Repeat this exercise 5-10 times with each hand. 1. Stand or sit with your arm, hand, and all five fingers pointed straight up. Make sure your wrist is straight. 2. Gently and slowly bend your fingers at the knuckles where they meet the hand until your hand  is making an upside-down L shape. Your fingers should form a tabletop. 3. Hold this position for a few seconds. 4. Extend your fingers out to their original position, all pointing straight up again.  Making Os Repeat this exercise 5-10 times with each hand. 1. Stand or sit with your arm, hand, and all five fingers pointed straight up. Make sure your wrist is straight. 2. Make an O shape by touching your pointer finger to your thumb. Hold for a few seconds. Then open your hand wide. 3. Repeat this motion with each finger on your hand.  Table Spread Repeat this exercise 5-10 times with each hand. 1. Place your hand on a table with your palm facing down. Make sure your wrist is straight. 2. Spread your fingers out as much as possible. Hold this position for a few seconds. 3. Slide your fingers back together again. Hold for a few seconds.  Ball Grip  Repeat this exercise 10-15 times with each hand. 1. Hold a tennis ball or another soft ball in your hand. 2. While slowly increasing pressure, squeeze the ball as hard as possible. 3. Squeeze as hard as you can for 3-5 seconds. 4. Relax and repeat.  Wrist Curls Repeat this exercise 10-15 times with each hand. 1. Sit in a chair that has armrests. 2. Hold a light weight in your hand, such as a dumbbell that weighs 1-3 pounds (0.5-1.4 kg). Ask your health care provider what weight would be  best for you. 3. Rest your hand just over the end of the chair arm with your palm facing up. 4. Gently pivot your wrist up and down while holding the weight. Do not twist your wrist from side to side.  Contact a health care provider if:  Your hand pain or discomfort gets much worse when you do an exercise.  Your hand pain or discomfort does not improve within 2 hours after you exercise. If you have any of these problems, stop doing these exercises right away. Do not do them again unless your health care provider says that you can. Get help right away  if:  You develop sudden, severe hand pain. If this happens, stop doing these exercises right away. Do not do them again unless your health care provider says that you can. This information is not intended to replace advice given to you by your health care provider. Make sure you discuss any questions you have with your health care provider. Document Released: 05/25/2015 Document Revised: 11/19/2015 Document Reviewed: 12/22/2014 Elsevier Interactive Patient Education  2018 [employer]. Natural anti-inflammatories  You can purchase these at [employer], [employer] or online.  . Turmeric (capsules)  . Ginger (ginger root or capsules)  . Omega 3 (Fish, flax seeds, chia seeds, walnuts, almonds)  . Tart cherry (dried or extract)   Patient should be under the care of a physician while taking these supplements. This may not be reproduced without the permission of Dr. Bo Merino.

## 2018-01-07 ENCOUNTER — Other Ambulatory Visit: Payer: Self-pay | Admitting: Family Medicine

## 2018-01-11 ENCOUNTER — Telehealth (INDEPENDENT_AMBULATORY_CARE_PROVIDER_SITE_OTHER): Payer: Self-pay | Admitting: Radiology

## 2018-01-11 ENCOUNTER — Telehealth: Payer: Self-pay

## 2018-01-11 MED ORDER — ACEBUTOLOL HCL 400 MG PO CAPS: 400.0000 mg | ORAL_CAPSULE | Freq: Every day | ORAL | 3 refills | Status: DC

## 2018-01-11 NOTE — Telephone Encounter (Signed)
Patient informed of long term monitor results showing APC's with symptoms.  Patient advised per Dr Bettina Gavia to start a beta blocker to alleviate palpitations.   Patient advised to start Acebutolol 400 mg one capsule daily. Rx sent to pharmacy.  Patient agrees to plan.

## 2018-01-11 NOTE — Telephone Encounter (Signed)
Diclofenac sodium gel approved GBMBOM:[id number];EVQWQV:[id number];Review Type:Prior Auth;Coverage Start Date:12/12/2017;Coverage End Date:01/11/2019

## 2018-01-14 NOTE — Progress Notes (Signed)
Cardiology Office Note:    Date:  01/15/2018   ID:  Tina Williams, DOB 08/09/1960, MRN [id number]  PCP:  Midge Minium, MD  Cardiologist:  Shirlee More, MD    Referring MD: Midge Minium, MD    ASSESSMENT:    1. APC (atrial premature contractions)   2. Chest pain in adult   3. Frequent PVCs   4. Chest pain, unspecified type    PLAN:    In order of problems listed above:  1. She has not done well with the beta-blocker it seems a bit of fatigue sleep disturbance vivid dreams occurred withdrawal side effects and I discontinued.  Before considering an antiarrhythmic drug like to have her undergo an ischemia evaluation 2. She is having atypical chest pain stress Myoview ordered if abnormal would require coronary angiography if normal I can start her on low-dose flecainide to alleviate symptomatic atrial and premature contractions 3. See above intolerant of a selective hydrophilic beta-blocker with CNS side effects discontinued consider low-dose antiarrhythmic drug if her myocardial perfusion study is normal   Next appointment: 2 weeks   Medication Adjustments/Labs and Tests Ordered: Current medicines are reviewed at length with the patient today.  Concerns regarding medicines are outlined above.  No orders of the defined types were placed in this encounter.  No orders of the defined types were placed in this encounter.   Chief Complaint  Patient presents with  . Follow-up    History of Present Illness:    Tina Williams is a 57 y.o. female with a hx of palpitation and an abnormal EKG last seen 11/28/2017. Her event monitor showed frequent and symptomatic APC's. Echo with EF 65%. Compliance with diet, lifestyle and medications: yes  When her heart rhythm monitor became available we offered a beta-blocker with frequent ventricular and supraventricular premature beats she initiated on Friday she has had sleep disturbance fatigue as well as vivid dreams she attributes  to the beta-blocker I agree its discontinued.  Over the weekend she had nonexertional chest pressure which lasted for days.  Prior to considering an antiarrhythmic drug she will undergo myocardial perfusion study.  Her echocardiogram is normal.  If her skin evaluation is normal with the place her on low-dose flecainide to alleviate symptomatic atrial and ventricular premature contractions.  She takes no over-the-counter proarrhythmic drugs Past Medical History:  Diagnosis Date  . Abdominal pain, epigastric 05/13/2014  . Allergic rhinoconjunctivitis 09/27/2017  . Allergy   . Asthma   . ASTHMA 10/09/2008   HFA 75% p coaching  09/27/10    . Depression   . Early menopause   . Fatigue 11/27/2017  . FOOT PAIN, BILATERAL 10/28/2009   Qualifier: Diagnosis of  By: Birdie Riddle MD, Belenda Cruise    . General medical examination 03/23/2011  . H/O oralPollinosis 03/04/2015  . Hx of adenomatous polyp of colon 01/11/2016  . Hyperlipidemia   . MENOPAUSE, EARLY 07/09/2008   Qualifier: Diagnosis of  By: Ronnald Ramp CMA, Chemira    . Mild persistent asthma without complication 08/27/3555  . Moderate recurrent major depression ([place]) 01/06/2014  . MYALGIA 11/24/2009   Qualifier: Diagnosis of  By: Birdie Riddle MD, Belenda Cruise    . Non-allergic rhinitis   . Obesity   . OBESITY 07/09/2008   Qualifier: Diagnosis of  By: Birdie Riddle MD, Belenda Cruise    . Oral allergy syndrome, subsequent encounter 03/04/2015  . Osteoarthritis   . Personal history of colonic adenoma  11/26/2009  . Polyarthralgia 10/25/2012  . Respiratory tract infection 03/04/2015  .  RHINITIS 10/09/2008   Qualifier: Diagnosis of  By: Birdie Riddle MD, Belenda Cruise    . Sinusitis acute   . Tinnitus of right ear 09/27/2017  . URI (upper respiratory infection)   . Vertigo 09/27/2017  . Vitamin D deficiency 10/28/2016    Past Surgical History:  Procedure Laterality Date  . CESAREAN SECTION  1988  . KNEE ARTHROPLASTY    . KNEE SURGERY Right 1998  . WISDOM TOOTH EXTRACTION  1984    Current  Medications: Current Meds  Medication Sig  . Acetaminophen (TYLENOL PO) Take 2 tablets by mouth every 8 (eight) hours as needed.   Marland Kitchen albuterol (PROAIR HFA) 108 (90 Base) MCG/ACT inhaler Inhale into the lungs every 6 (six) hours as needed for wheezing or shortness of breath.  Darlin Priestly HFA 200 MCG/ACT AERO TAKE 2 PUFFS BY MOUTH TWICE A DAY  . Azelastine HCl 0.15 % SOLN Place 2 sprays into both nostrils 2 (two) times daily.  . Cholecalciferol (VITAMIN D-3) 5000 units TABS Take 1 tablet by mouth daily.  . Cyanocobalamin (VITAMIN B-12) 1000 MCG SUBL Place 1 tablet under the tongue daily.  . diclofenac sodium (VOLTAREN) 1 % GEL Apply 3 gm to 3 large joints up to 3 times a day.Dispense 3 tubes with 3 refills.  . DULoxetine (CYMBALTA) 30 MG capsule TAKE 1 CAPSULE (30 MG TOTAL) BY MOUTH DAILY IN ADDITION TO 60MG  FOR TOTAL OF 90MG   . DULoxetine (CYMBALTA) 60 MG capsule TAKE 1 CAPSULE (60 MG TOTAL) BY MOUTH DAILY.  Marland Kitchen EPINEPHrine (EPIPEN 2-PAK) 0.3 mg/0.3 mL IJ SOAJ injection Use as directed for life threatening allergic reactions  . FIBER PO Take 2 tablets by mouth daily as needed.   . fluticasone (FLONASE) 50 MCG/ACT nasal spray USE ONE SPRAY IN EACH NOSTRIL TWICE DAILY  . levocetirizine (XYZAL) 5 MG tablet Take 5 mg by mouth every evening.  . Misc Natural Products (IMMUNE FORMULA PO) Take 1 tablet by mouth daily.  . montelukast (SINGULAIR) 10 MG tablet TAKE ONE TABLET ONCE DAILY ONCE DAILY AS DIRECTED  . omeprazole (PRILOSEC) 40 MG capsule TAKE 1 CAPSULE (40 MG TOTAL) BY MOUTH DAILY.     Allergies:   Latex; Amoxicillin; Clarithromycin; Naproxen sodium; Penicillins; and Sulfa antibiotics   Social History   Socioeconomic History  . Marital status: Married    Spouse name: Not on file  . Number of children: 3  . Years of education: Not on file  . Highest education level: Not on file  Occupational History  . Occupation: Research scientist (life sciences) Needs  . Financial resource strain: Not on file  .  Food insecurity:    Worry: Not on file    Inability: Not on file  . Transportation needs:    Medical: Not on file    Non-medical: Not on file  Tobacco Use  . Smoking status: Former Smoker    Packs/day: 0.50    Years: 18.00    Pack years: 9.00    Types: Cigarettes    Last attempt to quit: 06/28/1999    Years since quitting: 18.5  . Smokeless tobacco: Never Used  Substance and Sexual Activity  . Alcohol use: Yes    Alcohol/week: 4.2 oz    Types: 7 Glasses of wine per week  . Drug use: Never  . Sexual activity: Not on file  Lifestyle  . Physical activity:    Days per week: Not on file    Minutes per session: Not on file  . Stress: Not on  file  Relationships  . Social connections:    Talks on phone: Not on file    Gets together: Not on file    Attends religious service: Not on file    Active member of club or organization: Not on file    Attends meetings of clubs or organizations: Not on file    Relationship status: Not on file  Other Topics Concern  . Not on file  Social History Narrative  . Not on file     Family History: The patient's family history includes Colon cancer (age of onset: 72) in her sister; Diabetes in her brother, mother, and sister; Emphysema in her father; Epilepsy in her sister; Healthy in her son, son, and son; Heart Problems in her son; Hypertension in her brother and mother; Lung cancer in her sister. ROS:   Please see the history of present illness.    All other systems reviewed and are negative.  EKGs/Labs/Other Studies Reviewed:    The following studies were reviewed today:  EKG:  EKG ordered today.  The ekg ordered today demonstrates [place] normal  Recent Labs: 11/14/2017: ALT 12; BUN 12; Creatinine, Ser 0.59; Hemoglobin 13.2; Platelets 434.0; Potassium 4.5; Sodium 140; TSH 2.27  Recent Lipid Panel    Component Value Date/Time   CHOL 239 (H) 10/28/2016 0934   TRIG 120.0 10/28/2016 0934   HDL 66.80 10/28/2016 0934   CHOLHDL 4 10/28/2016  0934   VLDL 24.0 10/28/2016 0934   LDLCALC 148 (H) 10/28/2016 0934   LDLDIRECT 161.6 03/23/2011 1331    Physical Exam:    VS:  BP 114/78 (BP Location: Right Arm, Patient Position: Sitting, Cuff Size: Normal)   Pulse 68   Ht 5\' 1"  (1.549 m)   Wt 177 lb 12.8 oz (80.6 kg)   SpO2 99%   BMI 33.60 kg/m     Wt Readings from Last 3 Encounters:  01/15/18 177 lb 12.8 oz (80.6 kg)  01/03/18 175 lb (79.4 kg)  12/15/17 174 lb (78.9 kg)     GEN:  Well nourished, well developed in no acute distress HEENT: Normal NECK: No JVD; No carotid bruits LYMPHATICS: No lymphadenopathy CARDIAC: RRR, no murmurs, rubs, gallops RESPIRATORY:  Clear to auscultation without rales, wheezing or rhonchi  ABDOMEN: Soft, non-tender, non-distended MUSCULOSKELETAL:  No edema; No deformity  SKIN: Warm and dry NEUROLOGIC:  Alert and oriented x 3 PSYCHIATRIC:  Normal affect    Signed, Shirlee More, MD  01/15/2018 1:57 PM    [facility]

## 2018-01-15 ENCOUNTER — Encounter: Payer: Self-pay | Admitting: Emergency Medicine

## 2018-01-15 ENCOUNTER — Ambulatory Visit: Payer: BLUE CROSS/BLUE SHIELD | Admitting: Cardiology

## 2018-01-15 ENCOUNTER — Ambulatory Visit: Payer: BLUE CROSS/BLUE SHIELD | Admitting: Rheumatology

## 2018-01-15 ENCOUNTER — Encounter: Payer: Self-pay | Admitting: Cardiology

## 2018-01-15 VITALS — BP 114/78 | HR 68 | Ht 61.0 in | Wt 177.8 lb

## 2018-01-15 DIAGNOSIS — I491 Atrial premature depolarization: Secondary | ICD-10-CM

## 2018-01-15 DIAGNOSIS — I493 Ventricular premature depolarization: Secondary | ICD-10-CM

## 2018-01-15 DIAGNOSIS — R079 Chest pain, unspecified: Secondary | ICD-10-CM | POA: Diagnosis not present

## 2018-01-15 NOTE — Patient Instructions (Addendum)
Medication Instructions:  Your physician has recommended you make the following change in your medication:   STOP: Acebutolol 400 mg capsule.    Labwork: None.  Testing/Procedures: Your physician has requested that you have en exercise stress myoview. For further information please visit [email or web address]. Please follow instruction sheet, as given.     Follow-Up:  Any Other Special Instructions Will Be Listed Below (If Applicable).     If you need a refill on your cardiac medications before your next appointment, please call your pharmacy.  Cardiac Nuclear Scan A cardiac nuclear scan is a test that measures blood flow to the heart when a person is resting and when he or she is exercising. The test looks for problems such as:  Not enough blood reaching a portion of the heart.  The heart muscle not working normally.  You may need this test if:  You have heart disease.  You have had abnormal lab results.  You have had heart surgery or angioplasty.  You have chest pain.  You have shortness of breath.  In this test, a radioactive dye (tracer) is injected into your bloodstream. After the tracer has traveled to your heart, an imaging device is used to measure how much of the tracer is absorbed by or distributed to various areas of your heart. This procedure is usually done at a hospital and takes 2-4 hours. Tell a health care provider about:  Any allergies you have.  All medicines you are taking, including vitamins, herbs, eye drops, creams, and over-the-counter medicines.  Any problems you or family members have had with the use of anesthetic medicines.  Any blood disorders you have.  Any surgeries you have had.  Any medical conditions you have.  Whether you are pregnant or may be pregnant. What are the risks? Generally, this is a safe procedure. However, problems may occur, including:  Serious chest pain and heart attack. This is only a risk if the stress  portion of the test is done.  Rapid heartbeat.  Sensation of warmth in your chest. This usually passes quickly.  What happens before the procedure?  Ask your health care provider about changing or stopping your regular medicines. This is especially important if you are taking diabetes medicines or blood thinners.  Remove your jewelry on the day of the procedure. What happens during the procedure?  An IV tube will be inserted into one of your veins.  Your health care provider will inject a small amount of radioactive tracer through the tube.  You will wait for 20-40 minutes while the tracer travels through your bloodstream.  Your heart activity will be monitored with an electrocardiogram (ECG).  You will lie down on an exam table.  Images of your heart will be taken for about 15-20 minutes.  You may be asked to exercise on a treadmill or stationary bike. While you exercise, your heart's activity will be monitored with an ECG, and your blood pressure will be checked. If you are unable to exercise, you may be given a medicine to increase blood flow to parts of your heart.  When blood flow to your heart has peaked, a tracer will again be injected through the IV tube.  After 20-40 minutes, you will get back on the exam table and have more images taken of your heart.  When the procedure is over, your IV tube will be removed. The procedure may vary among health care providers and hospitals. Depending on the type of tracer used, scans may  need to be repeated 3-4 hours later. What happens after the procedure?  Unless your health care provider tells you otherwise, you may return to your normal schedule, including diet, activities, and medicines.  Unless your health care provider tells you otherwise, you may increase your fluid intake. This will help flush the contrast dye from your body. Drink enough fluid to keep your urine clear or pale yellow.  It is up to you to get your test  results. Ask your health care provider, or the department that is doing the test, when your results will be ready. Summary  A cardiac nuclear scan measures the blood flow to the heart when a person is resting and when he or she is exercising.  You may need this test if you are at risk for heart disease.  Tell your health care provider if you are pregnant.  Unless your health care provider tells you otherwise, increase your fluid intake. This will help flush the contrast dye from your body. Drink enough fluid to keep your urine clear or pale yellow. This information is not intended to replace advice given to you by your health care provider. Make sure you discuss any questions you have with your health care provider. Document Released: 07/08/2004 Document Revised: 06/15/2016 Document Reviewed: 05/22/2013 Elsevier Interactive Patient Education  2017 [employer].

## 2018-01-16 ENCOUNTER — Telehealth (HOSPITAL_COMMUNITY): Payer: Self-pay | Admitting: *Deleted

## 2018-01-16 ENCOUNTER — Telehealth: Payer: Self-pay | Admitting: Family Medicine

## 2018-01-16 MED ORDER — DULOXETINE HCL 60 MG PO CPEP: ORAL_CAPSULE | ORAL | 1 refills | Status: DC

## 2018-01-16 NOTE — Telephone Encounter (Signed)
Copied from [place] [id number]. Topic: Quick Communication - Rx Refill/Question >> Jan 16, 2018  1:06 PM Judyann Munson wrote: Medication: DULoxetine (CYMBALTA) 60 MG capsule   Has the patient contacted their pharmacy? No   Preferred Pharmacy (with phone number or street name):   Agent: Please be advised that RX refills may take up to 3 business days. We ask that you follow-up with your pharmacy.

## 2018-01-16 NOTE — Addendum Note (Signed)
Addended by: Davis Gourd on: 01/16/2018 01:49 PM   Modules accepted: Orders

## 2018-01-16 NOTE — Telephone Encounter (Signed)
Patient given detailed instructions per Myocardial Perfusion Study Information Sheet for the test on 01/19/18. Patient notified to arrive 15 minutes early and that it is imperative to arrive on time for appointment to keep from having the test rescheduled.  If you need to cancel or reschedule your appointment, please call the office within 24 hours of your appointment. . Patient verbalized understanding. Kirstie Peri, RN

## 2018-01-16 NOTE — Telephone Encounter (Signed)
Medication filled to pharmacy as requested.   

## 2018-01-16 NOTE — Telephone Encounter (Signed)
Error

## 2018-01-18 ENCOUNTER — Ambulatory Visit (INDEPENDENT_AMBULATORY_CARE_PROVIDER_SITE_OTHER): Payer: BLUE CROSS/BLUE SHIELD | Admitting: *Deleted

## 2018-01-18 DIAGNOSIS — J309 Allergic rhinitis, unspecified: Secondary | ICD-10-CM | POA: Diagnosis not present

## 2018-01-19 ENCOUNTER — Ambulatory Visit (HOSPITAL_COMMUNITY): Payer: BLUE CROSS/BLUE SHIELD | Attending: Internal Medicine

## 2018-01-19 ENCOUNTER — Telehealth: Payer: Self-pay | Admitting: Cardiology

## 2018-01-19 VITALS — Ht 61.0 in | Wt 177.0 lb

## 2018-01-19 DIAGNOSIS — R079 Chest pain, unspecified: Secondary | ICD-10-CM | POA: Diagnosis not present

## 2018-01-19 LAB — MYOCARDIAL PERFUSION IMAGING
CHL CUP NUCLEAR SDS: 4
CHL CUP NUCLEAR SRS: 7
CHL CUP RESTING HR STRESS: 77 {beats}/min
CSEPED: 6 min
CSEPHR: 98 %
CSEPPHR: 160 {beats}/min
Estimated workload: 7 METS
Exercise duration (sec): 0 s
LV dias vol: 73 mL (ref 46–106)
LVSYSVOL: 29 mL
MPHR: 163 {beats}/min
RATE: 0.35
SSS: 11
TID: 1.11

## 2018-01-19 MED ORDER — TECHNETIUM TC 99M TETROFOSMIN IV KIT
10.4000 | PACK | Freq: Once | INTRAVENOUS | Status: AC | PRN
  Administered 2018-01-19: 10.4 via INTRAVENOUS
  Filled 2018-01-19: qty 11

## 2018-01-19 MED ORDER — TECHNETIUM TC 99M TETROFOSMIN IV KIT
30.4000 | PACK | Freq: Once | INTRAVENOUS | Status: AC | PRN
  Administered 2018-01-19: 30.4 via INTRAVENOUS
  Filled 2018-01-19: qty 31

## 2018-01-19 NOTE — Telephone Encounter (Signed)
° ° ° °

## 2018-01-19 NOTE — Telephone Encounter (Signed)
Patient informed that lexiscan stress test results have not been read by Dr. Bettina Gavia yet and we would call her with the results as soon as they are available. Patient verbalized understanding. No further questions.

## 2018-01-24 ENCOUNTER — Encounter: Payer: Self-pay | Admitting: Cardiology

## 2018-01-24 ENCOUNTER — Other Ambulatory Visit: Payer: Self-pay | Admitting: *Deleted

## 2018-01-24 MED ORDER — FLECAINIDE ACETATE 50 MG PO TABS: 50.0000 mg | ORAL_TABLET | Freq: Two times a day (BID) | ORAL | 3 refills | Status: DC

## 2018-01-26 ENCOUNTER — Ambulatory Visit (INDEPENDENT_AMBULATORY_CARE_PROVIDER_SITE_OTHER): Payer: BLUE CROSS/BLUE SHIELD | Admitting: Physical Medicine and Rehabilitation

## 2018-01-26 DIAGNOSIS — R202 Paresthesia of skin: Secondary | ICD-10-CM | POA: Diagnosis not present

## 2018-01-26 NOTE — Progress Notes (Signed)
  Numeric Pain Rating Scale and Functional Assessment Average Pain 6   In the last MONTH (on 0-10 scale) has pain interfered with the following?  1. General activity like being  able to carry out your everyday physical activities such as walking, climbing stairs, carrying groceries, or moving a chair?  Rating(5)   

## 2018-01-29 ENCOUNTER — Encounter: Payer: Self-pay | Admitting: *Deleted

## 2018-01-29 ENCOUNTER — Ambulatory Visit: Payer: BLUE CROSS/BLUE SHIELD | Admitting: Cardiology

## 2018-01-29 DIAGNOSIS — I491 Atrial premature depolarization: Secondary | ICD-10-CM

## 2018-01-29 NOTE — Patient Instructions (Addendum)
Medication Instructions:  Your physician recommends that you continue on your current medications as directed. Please refer to the Current Medication list given to you today.   Labwork: None  Testing/Procedures: You had an EKG today.   Follow-Up: Your physician recommends that you schedule a follow-up appointment on 02/01/18 with Dr. Bettina Gavia at 10:20 am as scheduled.  If you need a refill on your cardiac medications before your next appointment, please call your pharmacy.   Thank you for choosing [facility]! Robyne Peers, RN [phone]

## 2018-01-29 NOTE — Progress Notes (Signed)
Tina Williams came in today per Dr. Bettina Gavia due to starting Flecainide 100 mg twice daily last week. Her EKG was reviewed with Dr. Geraldo Pitter. The patient is aware that no changes to her medication are to be made at this point.

## 2018-01-29 NOTE — Progress Notes (Signed)
Tina Williams - 57 y.o. female MRN [id number]  Date of birth: [birth year]  Office Visit Note: Visit Date: 01/26/2018 PCP: Midge Minium, MD Referred by: Midge Minium, MD  Subjective: Chief Complaint  Patient presents with  . Right Wrist - Pain  . Left Wrist - Pain   HPI: Mrs. Tina Williams is a 57 year old left-hand-dominant female who comes in today at the request of Dr. Cy Blamer for electrodiagnostic study of both upper limbs.  The patient is complaining mainly of bilateral wrist pain on the palmar side with numbness into the fingers left more than right.  She cannot really state which fingers seem to be more problematic.  She has reported that the left wrist has felt weak since 2012 when she had a wrist fracture.  The right wrist started bothering her about a year ago.  She follows with Dr. Estanislado Pandy with poly-arthralgias.  Ultrasound evidently was performed on the hands that did not show any active synovitis.  According to Dr. Arlean Hopping notes that the patient had positive manual compression test and positive Tinel sign bilaterally.  She has not had prior electrodiagnostic studies.  She has no radicular complaints down the arms.   ROS Otherwise per HPI.  Assessment & Plan: Visit Diagnoses:  1. Paresthesia of skin     Plan: No additional findings.  Impression: The above electrodiagnostic study is ABNORMAL and reveals evidence of a mild left median nerve entrapment at the wrist (carpal tunnel syndrome) affecting sensory components.   There is no significant electrodiagnostic evidence of any other focal nerve entrapment, brachial plexopathy or cervical radiculopathy.   As you know, this particular electrodiagnostic study cannot rule out chemical radiculitis or sensory only radiculopathy.    This electrodiagnostic study cannot rule out small fiber polyneuropathy and dysesthesias from central pain sensitization syndromes such as fibromyalgia.  Myotomal referral pain from  trigger points is also not excluded.   Recommendations: 1.  Continue current management of symptoms. 2.  Follow-up with referring physician. 3.  Continue use of resting splint at night-time and as needed during the day.   Meds & Orders: No orders of the defined types were placed in this encounter.   Orders Placed This Encounter  Procedures  . NCV with EMG (electromyography)    Follow-up: Return for Cy Blamer, M.D..   Procedures: No procedures performed  EMG & NCV Findings: Evaluation of the left median motor and the right median motor nerves showed reduced amplitude (L2.6, R1.2 mV).  The left median (across palm) sensory nerve showed prolonged distal peak latency (Wrist, 3.8 ms) and prolonged distal peak latency (Palm, 2.9 ms).  All remaining nerves (as indicated in the following tables) were within normal limits.  All left vs. right side differences were within normal limits.    All examined muscles (as indicated in the following table) showed no evidence of electrical instability.    Impression: The above electrodiagnostic study is ABNORMAL and reveals evidence of a mild left median nerve entrapment at the wrist (carpal tunnel syndrome) affecting sensory components.   There is no significant electrodiagnostic evidence of any other focal nerve entrapment, brachial plexopathy or cervical radiculopathy.   As you know, this particular electrodiagnostic study cannot rule out chemical radiculitis or sensory only radiculopathy.    This electrodiagnostic study cannot rule out small fiber polyneuropathy and dysesthesias from central pain sensitization syndromes such as fibromyalgia.  Myotomal referral pain from trigger points is also not excluded.   Recommendations: 1.  Continue current management  of symptoms. 2.  Follow-up with referring physician. 3.  Continue use of resting splint at night-time and as needed during the day.  ___________________________ Wonda Olds Board Certified, American Board of Physical Medicine and Rehabilitation    Nerve Conduction Studies Anti Sensory Summary Table   Stim Site NR Peak (ms) Norm Peak (ms) P-T Amp (V) Norm P-T Amp Site1 Site2 Delta-P (ms) Dist (cm) Vel (m/s) Norm Vel (m/s)  Left Median Acr Palm Anti Sensory (2nd Digit)  32C  Wrist    *3.8 <3.6 15.1 >10 Wrist Palm 0.9 0.0    Palm    *2.9 <2.0 5.9         Right Median Acr Palm Anti Sensory (2nd Digit)  31.3C  Wrist    3.3 <3.6 12.1 >10 Wrist Palm 2.4 0.0    Palm    0.9 <2.0 5.3         Left Radial Anti Sensory (Base 1st Digit)  33.8C  Wrist    2.1 <3.1 20.6  Wrist Base 1st Digit 2.1 0.0    Left Ulnar Anti Sensory (5th Digit)  32.3C  Wrist    3.4 <3.7 37.4 >15.0 Wrist 5th Digit 3.4 14.0 41 >38   Motor Summary Table   Stim Site NR Onset (ms) Norm Onset (ms) O-P Amp (mV) Norm O-P Amp Site1 Site2 Delta-0 (ms) Dist (cm) Vel (m/s) Norm Vel (m/s)  Left Median Motor (Abd Poll Brev)  33.3C  Wrist    3.4 <4.2 *2.6 >5 Elbow Wrist 3.2 16.3 51 >50  Elbow    6.6  8.2         Right Median Motor (Abd Poll Brev)  31.5C  Wrist    3.3 <4.2 *1.2 >5 Elbow Wrist 3.3 17.5 53 >50  Elbow    6.6  6.3         Left Ulnar Motor (Abd Dig Min)  32.3C  Wrist    3.0 <4.2 9.5 >3 B Elbow Wrist 2.6 16.0 62 >53  B Elbow    5.6  9.2  A Elbow B Elbow 1.7 9.0 53 >53  A Elbow    7.3  6.3          EMG   Side Muscle Nerve Root Ins Act Fibs Psw Amp Dur Poly Recrt Int Fraser Din Comment  Left Abd Poll Brev Median C8-T1 Nml Nml Nml Nml Nml 0 Nml Nml   Left 1stDorInt Ulnar C8-T1 Nml Nml Nml Nml Nml 0 Nml Nml   Left PronatorTeres Median C6-7 Nml Nml Nml Nml Nml 0 Nml Nml   Left Biceps Musculocut C5-6 Nml Nml Nml Nml Nml 0 Nml Nml   Left Deltoid Axillary C5-6 Nml Nml Nml Nml Nml 0 Nml Nml     Nerve Conduction Studies Anti Sensory Left/Right Comparison   Stim Site L Lat (ms) R Lat (ms) L-R Lat (ms) L Amp (V) R Amp (V) L-R Amp (%) Site1 Site2 L Vel (m/s) R Vel (m/s) L-R Vel (m/s)   Median Acr Palm Anti Sensory (2nd Digit)  32C  Wrist *3.8 3.3 0.5 15.1 12.1 19.9 Wrist Palm     Palm *2.9 0.9 2.0 5.9 5.3 10.2       Radial Anti Sensory (Base 1st Digit)  33.8C  Wrist 2.1   20.6   Wrist Base 1st Digit     Ulnar Anti Sensory (5th Digit)  32.3C  Wrist 3.4   37.4   Wrist 5th Digit 41     Motor Left/Right  Comparison   Stim Site L Lat (ms) R Lat (ms) L-R Lat (ms) L Amp (mV) R Amp (mV) L-R Amp (%) Site1 Site2 L Vel (m/s) R Vel (m/s) L-R Vel (m/s)  Median Motor (Abd Poll Brev)  33.3C  Wrist 3.4 3.3 0.1 *2.6 *1.2 53.8 Elbow Wrist 51 53 2  Elbow 6.6 6.6 0.0 8.2 6.3 23.2       Ulnar Motor (Abd Dig Min)  32.3C  Wrist 3.0   9.5   B Elbow Wrist 62    B Elbow 5.6   9.2   A Elbow B Elbow 53    A Elbow 7.3   6.3            Waveforms:                Clinical History: No specialty comments available.   She reports that she quit smoking about 18 years ago. Her smoking use included cigarettes. She has a 9.00 pack-year smoking history. She has never used smokeless tobacco. No results for input(s): HGBA1C, LABURIC in the last 8760 hours.  Objective:  VS:  HT:    WT:   BMI:     BP:   HR: bpm  TEMP: ( )  RESP:  Physical Exam  Musculoskeletal:  Inspection reveals no atrophy of the bilateral APB or FDI or hand intrinsics. There is no swelling, color changes, allodynia or dystrophic changes. There is 5 out of 5 strength in the bilateral wrist extension, finger abduction and long finger flexion. There is intact sensation to light touch in all dermatomal and peripheral nerve distributions. There is a negative Hoffmann's test bilaterally.  Neurological: She exhibits normal muscle tone. Coordination normal.    Ortho Exam Imaging: No results found.  Past Medical/Family/Surgical/Social History: Medications & Allergies reviewed per EMR, new medications updated. Patient Active Problem List   Diagnosis Date Noted  . Chest pain in adult 01/15/2018  . Palpitation 11/28/2017   . ANA positive 11/28/2017  . Fatigue 11/27/2017  . Mild persistent asthma without complication 02/58/5277  . Vertigo 09/27/2017  . Allergic rhinoconjunctivitis 09/27/2017  . Tinnitus of right ear 09/27/2017  . Vitamin D deficiency 10/28/2016  . Hx of adenomatous polyp of colon 01/11/2016  . Respiratory tract infection 03/04/2015  . H/O oralPollinosis 03/04/2015  . Oral allergy syndrome, subsequent encounter 03/04/2015  . Abdominal pain, epigastric 05/13/2014  . Moderate recurrent major depression ([place]) 01/06/2014  . Polyarthralgia 10/25/2012  . Hyperlipidemia 03/23/2011  . General medical examination 03/23/2011  . Personal history of colonic adenoma  11/26/2009  . MYALGIA 11/24/2009  . FOOT PAIN, BILATERAL 10/28/2009  . RHINITIS 10/09/2008  . ASTHMA 10/09/2008  . OBESITY 07/09/2008  . MENOPAUSE, EARLY 07/09/2008   Past Medical History:  Diagnosis Date  . Abdominal pain, epigastric 05/13/2014  . Allergic rhinoconjunctivitis 09/27/2017  . Allergy   . Asthma   . ASTHMA 10/09/2008   HFA 75% p coaching  09/27/10    . Depression   . Early menopause   . Fatigue 11/27/2017  . FOOT PAIN, BILATERAL 10/28/2009   Qualifier: Diagnosis of  By: Birdie Riddle MD, Belenda Cruise    . General medical examination 03/23/2011  . H/O oralPollinosis 03/04/2015  . Hx of adenomatous polyp of colon 01/11/2016  . Hyperlipidemia   . MENOPAUSE, EARLY 07/09/2008   Qualifier: Diagnosis of  By: Ronnald Ramp CMA, Chemira    . Mild persistent asthma without complication 01/26/4234  . Moderate recurrent major depression ([place]) 01/06/2014  . MYALGIA 11/24/2009  Qualifier: Diagnosis of  By: Birdie Riddle MD, Belenda Cruise    . Non-allergic rhinitis   . Obesity   . OBESITY 07/09/2008   Qualifier: Diagnosis of  By: Birdie Riddle MD, Belenda Cruise    . Oral allergy syndrome, subsequent encounter 03/04/2015  . Osteoarthritis   . Personal history of colonic adenoma  11/26/2009  . Polyarthralgia 10/25/2012  . Respiratory tract infection 03/04/2015  . RHINITIS  10/09/2008   Qualifier: Diagnosis of  By: Birdie Riddle MD, Belenda Cruise    . Sinusitis acute   . Tinnitus of right ear 09/27/2017  . URI (upper respiratory infection)   . Vertigo 09/27/2017  . Vitamin D deficiency 10/28/2016   Family History  Problem Relation Age of Onset  . Epilepsy Sister   . Colon cancer Sister 55  . Hypertension Mother   . Diabetes Mother   . Diabetes Brother   . Hypertension Brother   . Lung cancer Sister        was a smoker  . Diabetes Sister   . Emphysema Father        was a smoker  . Healthy Son   . Healthy Son   . Heart Problems Son        age 37  . Healthy Son    Past Surgical History:  Procedure Laterality Date  . CESAREAN SECTION  1988  . KNEE ARTHROPLASTY    . KNEE SURGERY Right 1998  . [place] EXTRACTION  1984   Social History   Occupational History  . Occupation: Teacher's Aid  Tobacco Use  . Smoking status: Former Smoker    Packs/day: 0.50    Years: 18.00    Pack years: 9.00    Types: Cigarettes    Last attempt to quit: 06/28/1999    Years since quitting: 18.6  . Smokeless tobacco: Never Used  Substance and Sexual Activity  . Alcohol use: Yes    Alcohol/week: 4.2 oz    Types: 7 Glasses of wine per week  . Drug use: Never  . Sexual activity: Not on file

## 2018-01-29 NOTE — Procedures (Signed)
EMG & NCV Findings: Evaluation of the left median motor and the right median motor nerves showed reduced amplitude (L2.6, R1.2 mV).  The left median (across palm) sensory nerve showed prolonged distal peak latency (Wrist, 3.8 ms) and prolonged distal peak latency (Palm, 2.9 ms).  All remaining nerves (as indicated in the following tables) were within normal limits.  All left vs. right side differences were within normal limits.    All examined muscles (as indicated in the following table) showed no evidence of electrical instability.    Impression: The above electrodiagnostic study is ABNORMAL and reveals evidence of a mild left median nerve entrapment at the wrist (carpal tunnel syndrome) affecting sensory components.   There is no significant electrodiagnostic evidence of any other focal nerve entrapment, brachial plexopathy or cervical radiculopathy.   As you know, this particular electrodiagnostic study cannot rule out chemical radiculitis or sensory only radiculopathy.    This electrodiagnostic study cannot rule out small fiber polyneuropathy and dysesthesias from central pain sensitization syndromes such as fibromyalgia.  Myotomal referral pain from trigger points is also not excluded.   Recommendations: 1.  Continue current management of symptoms. 2.  Follow-up with referring physician. 3.  Continue use of resting splint at night-time and as needed during the day.  ___________________________ Tina Williams Board Certified, American Board of Physical Medicine and Rehabilitation    Nerve Conduction Studies Anti Sensory Summary Table   Stim Site NR Peak (ms) Norm Peak (ms) P-T Amp (V) Norm P-T Amp Site1 Site2 Delta-P (ms) Dist (cm) Vel (m/s) Norm Vel (m/s)  Left Median Acr Palm Anti Sensory (2nd Digit)  32C  Wrist    *3.8 <3.6 15.1 >10 Wrist Palm 0.9 0.0    Palm    *2.9 <2.0 5.9         Right Median Acr Palm Anti Sensory (2nd Digit)  31.3C  Wrist    3.3 <3.6 12.1 >10  Wrist Palm 2.4 0.0    Palm    0.9 <2.0 5.3         Left Radial Anti Sensory (Base 1st Digit)  33.8C  Wrist    2.1 <3.1 20.6  Wrist Base 1st Digit 2.1 0.0    Left Ulnar Anti Sensory (5th Digit)  32.3C  Wrist    3.4 <3.7 37.4 >15.0 Wrist 5th Digit 3.4 14.0 41 >38   Motor Summary Table   Stim Site NR Onset (ms) Norm Onset (ms) O-P Amp (mV) Norm O-P Amp Site1 Site2 Delta-0 (ms) Dist (cm) Vel (m/s) Norm Vel (m/s)  Left Median Motor (Abd Poll Brev)  33.3C  Wrist    3.4 <4.2 *2.6 >5 Elbow Wrist 3.2 16.3 51 >50  Elbow    6.6  8.2         Right Median Motor (Abd Poll Brev)  31.5C  Wrist    3.3 <4.2 *1.2 >5 Elbow Wrist 3.3 17.5 53 >50  Elbow    6.6  6.3         Left Ulnar Motor (Abd Dig Min)  32.3C  Wrist    3.0 <4.2 9.5 >3 B Elbow Wrist 2.6 16.0 62 >53  B Elbow    5.6  9.2  A Elbow B Elbow 1.7 9.0 53 >53  A Elbow    7.3  6.3          EMG   Side Muscle Nerve Root Ins Act Fibs Psw Amp Dur Poly Recrt Int Pat Comment  Left Abd Poll Brev Median  C8-T1 Nml Nml Nml Nml Nml 0 Nml Nml   Left 1stDorInt Ulnar C8-T1 Nml Nml Nml Nml Nml 0 Nml Nml   Left PronatorTeres Median C6-7 Nml Nml Nml Nml Nml 0 Nml Nml   Left Biceps Musculocut C5-6 Nml Nml Nml Nml Nml 0 Nml Nml   Left Deltoid Axillary C5-6 Nml Nml Nml Nml Nml 0 Nml Nml     Nerve Conduction Studies Anti Sensory Left/Right Comparison   Stim Site L Lat (ms) R Lat (ms) L-R Lat (ms) L Amp (V) R Amp (V) L-R Amp (%) Site1 Site2 L Vel (m/s) R Vel (m/s) L-R Vel (m/s)  Median Acr Palm Anti Sensory (2nd Digit)  32C  Wrist *3.8 3.3 0.5 15.1 12.1 19.9 Wrist Palm     Palm *2.9 0.9 2.0 5.9 5.3 10.2       Radial Anti Sensory (Base 1st Digit)  33.8C  Wrist 2.1   20.6   Wrist Base 1st Digit     Ulnar Anti Sensory (5th Digit)  32.3C  Wrist 3.4   37.4   Wrist 5th Digit 41     Motor Left/Right Comparison   Stim Site L Lat (ms) R Lat (ms) L-R Lat (ms) L Amp (mV) R Amp (mV) L-R Amp (%) Site1 Site2 L Vel (m/s) R Vel (m/s) L-R Vel (m/s)  Median Motor  (Abd Poll Brev)  33.3C  Wrist 3.4 3.3 0.1 *2.6 *1.2 53.8 Elbow Wrist 51 53 2  Elbow 6.6 6.6 0.0 8.2 6.3 23.2       Ulnar Motor (Abd Dig Min)  32.3C  Wrist 3.0   9.5   B Elbow Wrist 62    B Elbow 5.6   9.2   A Elbow B Elbow 53    A Elbow 7.3   6.3            Waveforms:

## 2018-02-01 ENCOUNTER — Encounter: Payer: Self-pay | Admitting: Rheumatology

## 2018-02-01 ENCOUNTER — Encounter: Payer: Self-pay | Admitting: Cardiology

## 2018-02-01 ENCOUNTER — Ambulatory Visit: Payer: BLUE CROSS/BLUE SHIELD | Admitting: Cardiology

## 2018-02-01 VITALS — BP 116/60 | HR 85 | Ht 61.0 in | Wt 176.8 lb

## 2018-02-01 DIAGNOSIS — Z5181 Encounter for therapeutic drug level monitoring: Secondary | ICD-10-CM

## 2018-02-01 DIAGNOSIS — Z79899 Other long term (current) drug therapy: Secondary | ICD-10-CM | POA: Diagnosis not present

## 2018-02-01 DIAGNOSIS — I491 Atrial premature depolarization: Secondary | ICD-10-CM | POA: Diagnosis not present

## 2018-02-01 DIAGNOSIS — J4530 Mild persistent asthma, uncomplicated: Secondary | ICD-10-CM

## 2018-02-01 NOTE — Patient Instructions (Addendum)
Medication Instructions:  Your physician recommends that you continue on your current medications as directed. Please refer to the Current Medication list given to you today.   Labwork: You will have lab work today: Flecanide level  Testing/Procedures: NONE  Follow-Up: Your physician wants you to follow-up in: 6 months.  You will receive a reminder letter in the mail two months in advance. If you don't receive a letter, please call our office to schedule the follow-up appointment.   Any Other Special Instructions Will Be Listed Below (If Applicable).     If you need a refill on your cardiac medications before your next appointment, please call your pharmacy.     1. Avoid all over-the-counter antihistamines except Claritin/Loratadine and Zyrtec/Cetrizine. 2. Avoid all combination including cold sinus allergies flu decongestant and sleep medications 3. You can use Robitussin DM Mucinex and Mucinex DM for cough. 4. can use Tylenol aspirin ibuprofen and naproxen but no combinations such as sleep or sinus.  KardiaMobile [email or web address]        FDA-cleared, clinical grade mobile EKG monitor: Tina Williams is the most clinically-validated mobile EKG used by the world's leading cardiac care medical professionals With Basic service, know instantly if your heart rhythm is normal or if atrial fibrillation is detected, and email the last single EKG recording to yourself or your doctor Premium service, available for purchase through the Kardia app for $9.99 per month or $99 per year, includes unlimited history and storage of your EKG recordings, a monthly EKG summary report to share with your doctor, along with the ability to track your blood pressure, activity and weight Includes one K[employer]iaMobile phone clip FREE SHIPPING: Standard delivery 1-3 business days. Orders placed by 11:00am PST will ship that afternoon. Otherwise, will ship next business day. All orders ship  via [employer] from [place], [place]

## 2018-02-01 NOTE — Progress Notes (Signed)
Cardiology Office Note:    Date:  02/01/2018   ID:  Tina Williams, DOB 01/22/1961, MRN [id number]  PCP:  Midge Minium, MD  Cardiologist:  Shirlee More, MD    Referring MD: Midge Minium, MD    ASSESSMENT:    1. APC (atrial premature contractions)   2. Mild persistent asthma without complication    PLAN:    In order of problems listed above:  1. Improved she started antiarrhythmic drug therapy with low-dose flecainide initially had dull headache when she took it which is not uncommon this is resolved and in the last day or 2 she has had a marked decrease in palpitation and is pleased with the quality of life.  She will continue to avoid over-the-counter proarrhythmic drugs I encouraged her she had ongoing symptoms to buy the iPhone adapter to try to correlate symptoms with rhythm and to continue flecainide her EKG 3 days ago was normal no evidence of toxicity we will check a flecainide level and I will plan to see in the office in 6 months 2. Stable managed by her PCP   Next appointment: 6 months   Medication Adjustments/Labs and Tests Ordered: Current medicines are reviewed at length with the patient today.  Concerns regarding medicines are outlined above.  Orders Placed This Encounter  Procedures  . Flecainide level   No orders of the defined types were placed in this encounter.   Chief Complaint  Patient presents with  . Follow up on Medication  . Palpitations    started oin flecanide    History of Present Illness:    Tina Williams is a 57 y.o. female with a hx of palpitation with frequent APC's last seen 01/15/18. She was started on flecanide 01/24/18. Compliance with diet, lifestyle and medications: Yes  Her myocardial perfusion study was normal there is no evidence of structural heart disease started flecainide and after achieving blood levels had a nice response.  Initially she had some dull headache which is not uncommon its resolved.  Her asthma is  stable no shortness of breath chest pain syncope. Past Medical History:  Diagnosis Date  . Abdominal pain, epigastric 05/13/2014  . Allergic rhinoconjunctivitis 09/27/2017  . Allergy   . Asthma   . ASTHMA 10/09/2008   HFA 75% p coaching  09/27/10    . Depression   . Early menopause   . Fatigue 11/27/2017  . FOOT PAIN, BILATERAL 10/28/2009   Qualifier: Diagnosis of  By: Birdie Riddle MD, Belenda Cruise    . General medical examination 03/23/2011  . H/O oralPollinosis 03/04/2015  . Hx of adenomatous polyp of colon 01/11/2016  . Hyperlipidemia   . MENOPAUSE, EARLY 07/09/2008   Qualifier: Diagnosis of  By: Ronnald Ramp CMA, Chemira    . Mild persistent asthma without complication 9/0/2409  . Moderate recurrent major depression ([place]) 01/06/2014  . MYALGIA 11/24/2009   Qualifier: Diagnosis of  By: Birdie Riddle MD, Belenda Cruise    . Non-allergic rhinitis   . Obesity   . OBESITY 07/09/2008   Qualifier: Diagnosis of  By: Birdie Riddle MD, Belenda Cruise    . Oral allergy syndrome, subsequent encounter 03/04/2015  . Osteoarthritis   . Personal history of colonic adenoma  11/26/2009  . Polyarthralgia 10/25/2012  . Respiratory tract infection 03/04/2015  . RHINITIS 10/09/2008   Qualifier: Diagnosis of  By: Birdie Riddle MD, Belenda Cruise    . Sinusitis acute   . Tinnitus of right ear 09/27/2017  . URI (upper respiratory infection)   . Vertigo 09/27/2017  .  Vitamin D deficiency 10/28/2016    Past Surgical History:  Procedure Laterality Date  . CESAREAN SECTION  1988  . KNEE ARTHROPLASTY    . KNEE SURGERY Right 1998  . WISDOM TOOTH EXTRACTION  1984    Current Medications: Current Meds  Medication Sig  . albuterol (PROAIR HFA) 108 (90 Base) MCG/ACT inhaler Inhale into the lungs every 6 (six) hours as needed for wheezing or shortness of breath.  Darlin Priestly HFA 200 MCG/ACT AERO TAKE 2 PUFFS BY MOUTH TWICE A DAY  . Azelastine HCl 0.15 % SOLN Place 2 sprays into both nostrils 2 (two) times daily.  . Cholecalciferol (VITAMIN D-3) 5000 units TABS Take 1 tablet by  mouth daily.  . Cyanocobalamin (VITAMIN B-12) 1000 MCG SUBL Place 1 tablet under the tongue daily.  . diclofenac sodium (VOLTAREN) 1 % GEL Apply 3 gm to 3 large joints up to 3 times a day.Dispense 3 tubes with 3 refills.  . DULoxetine (CYMBALTA) 30 MG capsule TAKE 1 CAPSULE (30 MG TOTAL) BY MOUTH DAILY IN ADDITION TO 60MG  FOR TOTAL OF 90MG   . DULoxetine (CYMBALTA) 60 MG capsule TAKE 1 CAPSULE (60 MG TOTAL) BY MOUTH DAILY.  Marland Kitchen EPINEPHrine (EPIPEN 2-PAK) 0.3 mg/0.3 mL IJ SOAJ injection Use as directed for life threatening allergic reactions  . FIBER PO Take 2 tablets by mouth daily as needed.   . flecainide (TAMBOCOR) 50 MG tablet Take 1 tablet (50 mg total) by mouth 2 (two) times daily.  . fluticasone (FLONASE) 50 MCG/ACT nasal spray USE ONE SPRAY IN EACH NOSTRIL TWICE DAILY  . levocetirizine (XYZAL) 5 MG tablet Take 5 mg by mouth every evening.  . Misc Natural Products (IMMUNE FORMULA PO) Take 1 tablet by mouth daily.  . montelukast (SINGULAIR) 10 MG tablet TAKE ONE TABLET ONCE DAILY ONCE DAILY AS DIRECTED  . omeprazole (PRILOSEC) 40 MG capsule TAKE 1 CAPSULE (40 MG TOTAL) BY MOUTH DAILY.     Allergies:   Latex; Amoxicillin; Clarithromycin; Naproxen sodium; Penicillins; and Sulfa antibiotics   Social History   Socioeconomic History  . Marital status: Married    Spouse name: Not on file  . Number of children: 3  . Years of education: Not on file  . Highest education level: Not on file  Occupational History  . Occupation: Research scientist (life sciences) Needs  . Financial resource strain: Not on file  . Food insecurity:    Worry: Not on file    Inability: Not on file  . Transportation needs:    Medical: Not on file    Non-medical: Not on file  Tobacco Use  . Smoking status: Former Smoker    Packs/day: 0.50    Years: 18.00    Pack years: 9.00    Types: Cigarettes    Last attempt to quit: 06/28/1999    Years since quitting: 18.6  . Smokeless tobacco: Never Used  Substance and Sexual  Activity  . Alcohol use: Yes    Alcohol/week: 7.0 standard drinks    Types: 7 Glasses of wine per week  . Drug use: Never  . Sexual activity: Not on file  Lifestyle  . Physical activity:    Days per week: Not on file    Minutes per session: Not on file  . Stress: Not on file  Relationships  . Social connections:    Talks on phone: Not on file    Gets together: Not on file    Attends religious service: Not on file  Active member of club or organization: Not on file    Attends meetings of clubs or organizations: Not on file    Relationship status: Not on file  Other Topics Concern  . Not on file  Social History Narrative  . Not on file     Family History: The patient's  family history includes Colon cancer (age of onset: 24) in her sister; Diabetes in her brother, mother, and sister; Emphysema in her father; Epilepsy in her sister; Healthy in her son, son, and son; Heart Problems in her son; Hypertension in her brother and mother; Lung cancer in her sister. ROS:  She had headache initially with flecanide Please see the history of present illness.    All other systems reviewed and are negative.  EKGs/Labs/Other Studies Reviewed:    The following studies were reviewed today:  EKG:  EKG 01/29/2018 demonstrates sinus rhythm normal  Recent Labs: 11/14/2017: ALT 12; BUN 12; Creatinine, Ser 0.59; Hemoglobin 13.2; Platelets 434.0; Potassium 4.5; Sodium 140; TSH 2.27  Recent Lipid Panel    Component Value Date/Time   CHOL 239 (H) 10/28/2016 0934   TRIG 120.0 10/28/2016 0934   HDL 66.80 10/28/2016 0934   CHOLHDL 4 10/28/2016 0934   VLDL 24.0 10/28/2016 0934   LDLCALC 148 (H) 10/28/2016 0934   LDLDIRECT 161.6 03/23/2011 1331    Physical Exam:    VS:  BP 116/60   Pulse 85   Ht 5\' 1"  (1.549 m)   Wt 176 lb 12.8 oz (80.2 kg)   SpO2 98%   BMI 33.41 kg/m     Wt Readings from Last 3 Encounters:  02/01/18 176 lb 12.8 oz (80.2 kg)  01/19/18 177 lb (80.3 kg)  01/15/18 177 lb  12.8 oz (80.6 kg)     GEN:  Well nourished, well developed in no acute distress HEENT: Normal NECK: No JVD; No carotid bruits LYMPHATICS: No lymphadenopathy CARDIAC: RRR, no murmurs, rubs, gallops RESPIRATORY:  Clear to auscultation without rales, wheezing or rhonchi  ABDOMEN: Soft, non-tender, non-distended MUSCULOSKELETAL:  No edema; No deformity  SKIN: Warm and dry NEUROLOGIC:  Alert and oriented x 3 PSYCHIATRIC:  Normal affect    Signed, Shirlee More, MD  02/01/2018 10:44 AM    [place]

## 2018-02-03 LAB — FLECAINIDE LEVEL: FLECAINIDE: 0.21 ug/mL (ref 0.20–1.00)

## 2018-02-05 ENCOUNTER — Other Ambulatory Visit: Payer: Self-pay | Admitting: Allergy and Immunology

## 2018-02-08 ENCOUNTER — Ambulatory Visit (INDEPENDENT_AMBULATORY_CARE_PROVIDER_SITE_OTHER): Payer: BLUE CROSS/BLUE SHIELD | Admitting: *Deleted

## 2018-02-08 DIAGNOSIS — J309 Allergic rhinitis, unspecified: Secondary | ICD-10-CM | POA: Diagnosis not present

## 2018-02-13 DIAGNOSIS — D225 Melanocytic nevi of trunk: Secondary | ICD-10-CM | POA: Diagnosis not present

## 2018-02-13 DIAGNOSIS — L308 Other specified dermatitis: Secondary | ICD-10-CM | POA: Diagnosis not present

## 2018-02-13 DIAGNOSIS — Z1283 Encounter for screening for malignant neoplasm of skin: Secondary | ICD-10-CM | POA: Diagnosis not present

## 2018-02-18 ENCOUNTER — Other Ambulatory Visit: Payer: Self-pay | Admitting: Allergy and Immunology

## 2018-02-23 ENCOUNTER — Ambulatory Visit (INDEPENDENT_AMBULATORY_CARE_PROVIDER_SITE_OTHER): Payer: BLUE CROSS/BLUE SHIELD | Admitting: *Deleted

## 2018-02-23 DIAGNOSIS — J309 Allergic rhinitis, unspecified: Secondary | ICD-10-CM

## 2018-03-07 DIAGNOSIS — L308 Other specified dermatitis: Secondary | ICD-10-CM | POA: Diagnosis not present

## 2018-03-08 ENCOUNTER — Telehealth: Payer: Self-pay | Admitting: *Deleted

## 2018-03-08 NOTE — Telephone Encounter (Signed)
Spoke with Tina Williams and advised her to send the EKG strips to [place] via email for Dr. Bettina Gavia to review. Patient reports taking her medications as prescribed. Patient has no complaints at this time. No further questions.

## 2018-03-08 NOTE — Telephone Encounter (Signed)
Pt has cardiac device on phone which stated she had a-fib for 27 seconds and then 2 min later said normal. Does she need to be concerned? Please Advise

## 2018-03-09 NOTE — Telephone Encounter (Signed)
Attempted to contact patient with no answer. Unable to leave message. Responded to patient via patient advice request email. Advised her to contact our office with further questions or concerns.

## 2018-03-23 ENCOUNTER — Ambulatory Visit (INDEPENDENT_AMBULATORY_CARE_PROVIDER_SITE_OTHER): Payer: BLUE CROSS/BLUE SHIELD | Admitting: *Deleted

## 2018-03-23 DIAGNOSIS — J309 Allergic rhinitis, unspecified: Secondary | ICD-10-CM | POA: Diagnosis not present

## 2018-03-28 ENCOUNTER — Encounter: Payer: Self-pay | Admitting: Allergy and Immunology

## 2018-03-29 ENCOUNTER — Ambulatory Visit: Payer: BLUE CROSS/BLUE SHIELD | Admitting: Allergy and Immunology

## 2018-03-29 ENCOUNTER — Encounter: Payer: Self-pay | Admitting: Allergy and Immunology

## 2018-03-29 VITALS — BP 112/74 | HR 84 | Resp 18

## 2018-03-29 DIAGNOSIS — D4701 Cutaneous mastocytosis: Secondary | ICD-10-CM

## 2018-03-29 DIAGNOSIS — J4530 Mild persistent asthma, uncomplicated: Secondary | ICD-10-CM

## 2018-03-29 DIAGNOSIS — T781XXD Other adverse food reactions, not elsewhere classified, subsequent encounter: Secondary | ICD-10-CM

## 2018-03-29 DIAGNOSIS — J3089 Other allergic rhinitis: Secondary | ICD-10-CM | POA: Diagnosis not present

## 2018-03-29 NOTE — Patient Instructions (Addendum)
  1. Continue Immunotherapy and Epi-Pen  2. Continue Flonase + Azelastine 1-2 sprays each nostril 1-2 times per day   3. Continue Asmanex 200 HFA two inhalations 1-2 times per day depending on asthma activity.   4. Continue Montelukast 10mg  one tablet one time per day  5. Continue claritin / zyrtec, nasal saline if needed  6. Continue Proair HFA if needed  7. Obtain fall flu vaccine    8. Return in 6 months or earlier if problem  9. Obtain blood -tryptase

## 2018-03-29 NOTE — Progress Notes (Signed)
Follow-up Note  Referring Provider: Midge Minium, MD Primary Provider: Midge Minium, MD Date of Office Visit: 03/29/2018  Subjective:   Tina Williams (DOB: 1960/09/28) is a 57 y.o. female who returns to the [facility] [place] on 03/29/2018 in re-evaluation of the following:  HPI: Tina Williams returns to this clinic in reevaluation of asthma and allergic rhinitis and history of oral pollinosis syndrome.  I have not seen in this clinic since 23 August 2016.  At this point she is doing very well with her airways and rarely uses a short acting bronchodilator and can exert herself without any problem and has not required a systemic steroid to treat an exacerbation.  Likewise her nose is really doing quite well and she has not required an antibiotic to treat an episode of sinusitis.  She had a problem with oral pollinosis syndrome in the past but now she can eat avocados without any problem.  In addition, she tried almonds this summer but apparently developed a coughing episode and she has eliminated almond consumption at this point.  She continues on immunotherapy currently at every 2 weeks without any adverse effects.  She continues on nasal fluticasone and Asmanex and montelukast as well.  She apparently developed a rash on her arms and achiness of her joints and saw a rheumatologist who felt that she did not have an autoimmune disease and she also developed an issue with stomach upset and subsequently had a biopsy of her skin which identified telangiectasia macularis eruptiva perstans and is now using ranitidine every day with a very good result regarding both her GI issue and her skin.  As well, she also developed some type of cardiac conduction abnormality and is now on flecainide.  Allergies as of 03/29/2018      Reactions   Latex Other (See Comments)   Throat swelling, chest tightness   Amoxicillin Hives   Clarithromycin    REACTION: hives   Naproxen Sodium    REACTION: HIVES   Penicillins    REACTION: hives   Sulfa Antibiotics Hives      Medication List      ASMANEX HFA 200 MCG/ACT Aero Generic drug:  Mometasone Furoate TAKE 2 PUFFS BY MOUTH TWICE A DAY   Azelastine HCl 0.15 % Soln Place 2 sprays into both nostrils 2 (two) times daily.   diclofenac sodium 1 % Gel Commonly known as:  VOLTAREN Apply 3 gm to 3 large joints up to 3 times a day.Dispense 3 tubes with 3 refills.   DULoxetine 30 MG capsule Commonly known as:  CYMBALTA TAKE 1 CAPSULE (30 MG TOTAL) BY MOUTH DAILY IN ADDITION TO 60MG  FOR TOTAL OF 90MG    DULoxetine 60 MG capsule Commonly known as:  CYMBALTA TAKE 1 CAPSULE (60 MG TOTAL) BY MOUTH DAILY.   EPINEPHrine 0.3 mg/0.3 mL Soaj injection Commonly known as:  EPI-PEN Use as directed for life threatening allergic reactions   FIBER PO Take 2 tablets by mouth daily as needed.   flecainide 50 MG tablet Commonly known as:  TAMBOCOR Take 1 tablet (50 mg total) by mouth 2 (two) times daily.   fluticasone 50 MCG/ACT nasal spray Commonly known as:  FLONASE USE ONE SPRAY IN EACH NOSTRIL TWICE DAILY   IMMUNE FORMULA PO Take 1 tablet by mouth daily.   montelukast 10 MG tablet Commonly known as:  SINGULAIR TAKE ONE TABLET ONCE DAILY AS DIRECTED   omeprazole 40 MG capsule Commonly known as:  PRILOSEC TAKE 1  CAPSULE (40 MG TOTAL) BY MOUTH DAILY.   PROAIR HFA 108 (90 Base) MCG/ACT inhaler Generic drug:  albuterol Inhale into the lungs every 6 (six) hours as needed for wheezing or shortness of breath.   Vitamin B-12 1000 MCG Subl Place 1 tablet under the tongue daily.   Vitamin D-3 5000 units Tabs Take 1 tablet by mouth daily.       Past Medical History:  Diagnosis Date  . Abdominal pain, epigastric 05/13/2014  . Allergic rhinoconjunctivitis 09/27/2017  . Allergy   . Asthma   . ASTHMA 10/09/2008   HFA 75% p coaching  09/27/10    . Depression   . Early menopause   . Fatigue 11/27/2017  . FOOT PAIN, BILATERAL  10/28/2009   Qualifier: Diagnosis of  By: Birdie Riddle MD, Belenda Cruise    . General medical examination 03/23/2011  . H/O oralPollinosis 03/04/2015  . Hx of adenomatous polyp of colon 01/11/2016  . Hyperlipidemia   . MENOPAUSE, EARLY 07/09/2008   Qualifier: Diagnosis of  By: Ronnald Ramp CMA, Chemira    . Mild persistent asthma without complication 03/02/453  . Moderate recurrent major depression ([place]) 01/06/2014  . MYALGIA 11/24/2009   Qualifier: Diagnosis of  By: Birdie Riddle MD, Belenda Cruise    . Non-allergic rhinitis   . Obesity   . OBESITY 07/09/2008   Qualifier: Diagnosis of  By: Birdie Riddle MD, Belenda Cruise    . Oral allergy syndrome, subsequent encounter 03/04/2015  . Osteoarthritis   . Personal history of colonic adenoma  11/26/2009  . Polyarthralgia 10/25/2012  . Respiratory tract infection 03/04/2015  . RHINITIS 10/09/2008   Qualifier: Diagnosis of  By: Birdie Riddle MD, Belenda Cruise    . Sinusitis acute   . Tinnitus of right ear 09/27/2017  . URI (upper respiratory infection)   . Vertigo 09/27/2017  . Vitamin D deficiency 10/28/2016    Past Surgical History:  Procedure Laterality Date  . CESAREAN SECTION  1988  . KNEE ARTHROPLASTY    . KNEE SURGERY Right 1998  . WISDOM TOOTH EXTRACTION  1984    Review of systems negative except as noted in HPI / PMHx or noted below:  Review of Systems  Constitutional: Negative.   HENT: Negative.   Eyes: Negative.   Respiratory: Negative.   Cardiovascular: Negative.   Gastrointestinal: Negative.   Genitourinary: Negative.   Musculoskeletal: Negative.   Skin: Negative.   Neurological: Negative.   Endo/Heme/Allergies: Negative.   Psychiatric/Behavioral: Negative.      Objective:   Vitals:   03/29/18 1118  BP: 112/74  Pulse: 84  Resp: 18          Physical Exam  HENT:  Head: Normocephalic.  Right Ear: Tympanic membrane, external ear and ear canal normal.  Left Ear: Tympanic membrane, external ear and ear canal normal.  Nose: Nose normal. No mucosal edema or rhinorrhea.    Mouth/Throat: Uvula is midline, oropharynx is clear and moist and mucous membranes are normal. No oropharyngeal exudate.  Eyes: Conjunctivae are normal.  Neck: Trachea normal. No tracheal tenderness present. No tracheal deviation present. No thyromegaly present.  Cardiovascular: Normal rate, regular rhythm, S1 normal, S2 normal and normal heart sounds.  No murmur heard. Pulmonary/Chest: Breath sounds normal. No stridor. No respiratory distress. She has no wheezes. She has no rales.  Musculoskeletal: She exhibits no edema.  Lymphadenopathy:       Head (right side): No tonsillar adenopathy present.       Head (left side): No tonsillar adenopathy present.    She has  no cervical adenopathy.  Neurological: She is alert.  Skin: She is not diaphoretic. No erythema. Nails show no clubbing.    Diagnostics:    Spirometry was performed and demonstrated an FEV1 of 2.10 at 89 % of predicted.  The patient had an Asthma Control Test with the following results: ACT Total Score: 25.    Assessment and Plan:   1. Asthma, well controlled, mild persistent   2. Perennial allergic rhinitis   3. Oral allergy syndrome, subsequent encounter   4. Telangiectasia macularis eruptiva perstans     1. Continue Immunotherapy and Epi-Pen  2. Continue Flonase +Azelastine 1-2 sprays each nostril 1-2 times per day   3. Continue Asmanex 200 HFA two inhalations 1-2 times per day depending on asthma activity.   4. Continue Montelukast 10mg  one tablet one time per day  5. Continue claritin / zyrtec, nasal saline if needed  6. Continue Proair HFA if needed  7. Obtain fall flu vaccine    8. Return in 6 months or earlier if problem  9. Obtain blood -tryptase  Ashanna appears to be doing relatively well regarding her airway and she can continue on the therapy noted above which includes immunotherapy and anti-inflammatory agents for both her upper and lower airway.  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  I will contact her with the results of the blood test once they are available for review.  I will see her back in this clinic in 6 months or earlier if there is a problem.  Allena Katz, MD Allergy / Immunology [place]

## 2018-03-30 ENCOUNTER — Other Ambulatory Visit: Payer: Self-pay | Admitting: Allergy and Immunology

## 2018-04-01 ENCOUNTER — Encounter: Payer: Self-pay | Admitting: Allergy and Immunology

## 2018-04-02 DIAGNOSIS — D4701 Cutaneous mastocytosis: Secondary | ICD-10-CM | POA: Diagnosis not present

## 2018-04-04 LAB — TRYPTASE: TRYPTASE: 6.1 ug/L (ref 2.2–13.2)

## 2018-04-05 NOTE — Progress Notes (Signed)
Office Visit Note  Patient: Tina Williams             Date of Birth: 1961-06-17           MRN: [id number]             PCP: Midge Minium, MD Referring: Midge Minium, MD Visit Date: 04/18/2018 Occupation: @GUAROCC @  Subjective:  Left third toe pain.  History of Present Illness: Tina Williams is a 57 y.o. female with history of osteoarthritis and myofascial pain.  According to patient she has been having pain and discomfort in her left third toe for the last 1-1/2-week.  She is denies any history of any injury.  She states she went to the urgent care where she had x-ray which was unremarkable.  She states the swelling has gone down but is still swollen.  None of the other joints are painful.  The overall discomfort in her muscles and joints is better.  The skin biopsy for the rash on her upper extremities.  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  Activities of Daily Living:  Patient reports morning stiffness for 20-30 minutes.   Patient Denies nocturnal pain.  Difficulty dressing/grooming: Denies Difficulty climbing stairs: Reports Difficulty getting out of chair: Denies Difficulty using hands for taps, buttons, cutlery, and/or writing: Reports  Review of Systems  Constitutional: Negative for fatigue.  HENT: Negative for mouth sores, trouble swallowing, trouble swallowing, mouth dryness and nose dryness.   Eyes: Negative for pain, redness, itching, visual disturbance and dryness.  Respiratory: Negative for cough, hemoptysis, shortness of breath, wheezing and difficulty breathing.   Cardiovascular: Negative for chest pain, palpitations, hypertension and swelling in legs/feet.  Gastrointestinal: Positive for constipation. Negative for abdominal pain, blood in stool, diarrhea and vomiting.  Endocrine: Negative for increased urination.  Genitourinary: Negative for painful urination, nocturia and pelvic pain.  Musculoskeletal:  Positive for arthralgias, joint pain, joint swelling and morning stiffness. Negative for myalgias, muscle weakness, muscle tenderness and myalgias.  Skin: Positive for rash. Negative for color change, pallor, hair loss, nodules/bumps, skin tightness, ulcers and sensitivity to sunlight.  Allergic/Immunologic: Negative for susceptible to infections.  Neurological: Negative for dizziness, light-headedness, numbness, headaches, memory loss and weakness.  Hematological: Negative for bruising/bleeding tendency and swollen glands.  Psychiatric/Behavioral: Negative for depressed mood, confusion and sleep disturbance. The patient is not nervous/anxious.     PMFS History:  Patient Active Problem List   Diagnosis Date Noted  . Chest pain in adult 01/15/2018  . Palpitation 11/28/2017  . ANA positive 11/28/2017  . Fatigue 11/27/2017  . Mild persistent asthma without complication 72/53/6644  . Vertigo 09/27/2017  . Allergic rhinoconjunctivitis 09/27/2017  . Tinnitus of right ear 09/27/2017  . Vitamin D deficiency 10/28/2016  . Hx of adenomatous polyp of colon 01/11/2016  . Respiratory tract infection 03/04/2015  . H/O oralPollinosis 03/04/2015  . Oral allergy syndrome, subsequent encounter 03/04/2015  . Abdominal pain, epigastric 05/13/2014  . Moderate recurrent major depression ([place]) 01/06/2014  . Polyarthralgia 10/25/2012  . Hyperlipidemia 03/23/2011  . General medical examination 03/23/2011  . Personal history of colonic adenoma  11/26/2009  . MYALGIA 11/24/2009  . FOOT PAIN, BILATERAL 10/28/2009  . RHINITIS 10/09/2008  . ASTHMA 10/09/2008  . OBESITY 07/09/2008  . MENOPAUSE, EARLY 07/09/2008    Past Medical History:  Diagnosis Date  . Abdominal pain, epigastric 05/13/2014  . Allergic rhinoconjunctivitis 09/27/2017  . Allergy   . Asthma   . ASTHMA 10/09/2008  HFA 75% p coaching  09/27/10    . Depression   . Early menopause   . Fatigue 11/27/2017  . FOOT PAIN, BILATERAL 10/28/2009    Qualifier: Diagnosis of  By: Birdie Riddle MD, Belenda Cruise    . General medical examination 03/23/2011  . H/O oralPollinosis 03/04/2015  . Hx of adenomatous polyp of colon 01/11/2016  . Hyperlipidemia   . MENOPAUSE, EARLY 07/09/2008   Qualifier: Diagnosis of  By: Ronnald Ramp CMA, Chemira    . Mild persistent asthma without complication 01/29/6313  . Moderate recurrent major depression ([place]) 01/06/2014  . MYALGIA 11/24/2009   Qualifier: Diagnosis of  By: Birdie Riddle MD, Belenda Cruise    . Non-allergic rhinitis   . Obesity   . OBESITY 07/09/2008   Qualifier: Diagnosis of  By: Birdie Riddle MD, Belenda Cruise    . Oral allergy syndrome, subsequent encounter 03/04/2015  . Osteoarthritis   . Personal history of colonic adenoma  11/26/2009  . Polyarthralgia 10/25/2012  . Respiratory tract infection 03/04/2015  . RHINITIS 10/09/2008   Qualifier: Diagnosis of  By: Birdie Riddle MD, Belenda Cruise    . Sinusitis acute   . Tinnitus of right ear 09/27/2017  . URI (upper respiratory infection)   . Vertigo 09/27/2017  . Vitamin D deficiency 10/28/2016    Family History  Problem Relation Age of Onset  . Epilepsy Sister   . Colon cancer Sister 74  . Hypertension Mother   . Diabetes Mother   . Diabetes Brother   . Hypertension Brother   . Lung cancer Sister        was a smoker  . Diabetes Sister   . Emphysema Father        was a smoker  . Healthy Son   . Healthy Son   . Heart Problems Son        age 32  . Healthy Son    Past Surgical History:  Procedure Laterality Date  . CESAREAN SECTION  1988  . KNEE ARTHROPLASTY    . KNEE SURGERY Right 1998  . [place] EXTRACTION  1984   Social History   Social History Narrative  . Not on file    Objective: Vital Signs: BP 126/70 (BP Location: Left Arm, Patient Position: Sitting, Cuff Size: Normal)   Pulse 75   Resp 12   Ht 5\' 1"  (1.549 m)   Wt 175 lb 12.8 oz (79.7 kg)   BMI 33.22 kg/m    Physical Exam  Constitutional: She is oriented to person, place, and time. She appears well-developed and  well-nourished.  HENT:  Head: Normocephalic and atraumatic.  Eyes: Conjunctivae and EOM are normal.  Neck: Normal range of motion.  Cardiovascular: Normal rate, regular rhythm, normal heart sounds and intact distal pulses.  Pulmonary/Chest: Effort normal and breath sounds normal.  Abdominal: Soft. Bowel sounds are normal.  Lymphadenopathy:    She has no cervical adenopathy.  Neurological: She is alert and oriented to person, place, and time.  Skin: Skin is warm and dry. Capillary refill takes less than 2 seconds. Rash noted.  Psychiatric: She has a normal mood and affect. Her behavior is normal.  Nursing note and vitals reviewed.    Musculoskeletal Exam: C-spine thoracic lumbar spine good range of motion.  Shoulder joints elbow joints wrist joint MCPs PIPs DIPs were in good range of motion.  Hip joints knee joints ankles MTPs PIPs were in good range of motion.  She had tenderness and swelling over her left third MTP joint.  CDAI Exam: CDAI Score:  Not documented Patient Global Assessment: Not documented; Provider Global Assessment: Not documented Swollen: Not documented; Tender: Not documented Joint Exam   Not documented   There is currently no information documented on the homunculus. Go to the Rheumatology activity and complete the homunculus joint exam.  Investigation: No additional findings.  Imaging: Xr Foot Complete Left  Result Date: 04/18/2018 First MTP narrowing was noted.  PIP and DIP narrowing was noted.  No intertarsal joint narrowing was noted.  No erosive changes were noted.  Small calcaneal spur was noted.  No fracture was noted.   Recent Labs: Lab Results  Component Value Date   WBC 6.9 11/14/2017   HGB 13.2 11/14/2017   PLT 434.0 (H) 11/14/2017   NA 140 11/14/2017   K 4.5 11/14/2017   CL 102 11/14/2017   CO2 31 11/14/2017   GLUCOSE 105 (H) 11/14/2017   BUN 12 11/14/2017   CREATININE 0.59 11/14/2017   BILITOT 0.6 11/14/2017   ALKPHOS 90 11/14/2017    AST 12 11/14/2017   ALT 12 11/14/2017   PROT 6.9 12/15/2017   ALBUMIN 4.4 11/14/2017   CALCIUM 9.7 11/14/2017    Speciality Comments: No specialty comments available.  Procedures:  No procedures performed Allergies: Latex; Amoxicillin; Clarithromycin; Naproxen sodium; Penicillins; and Sulfa antibiotics   Assessment / Plan:     Visit Diagnoses: Left foot pain -patient has been experiencing acute pain and swelling in her left third toe for the last 1-1/2-week.  X-ray obtained of her foot was unremarkable except for osteoarthritis.  Plan: XR Foot Complete Left.  No fracture was noted.  I will schedule MRI of her left foot to evaluate this further.  I will contact her once the MRI results are available.  Primary osteoarthritis of both hands - All autoimmune work-up negative, ultrasound negative for synovitis.  Primary osteoarthritis of both feet-she has been having difficulty walking due to swelling in her left foot.  She would like to start time off from work for at least 1 week.  A work note was given.  Carpal tunnel syndrome, left upper limb - 01/26/18: Mild median nerve entrapement  Myofascial pain-she continues to have some generalized myalgias and arthralgias.  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   Orders: Orders Placed This Encounter  Procedures  . XR Foot Complete Left  . MR FOOT LEFT WO CONTRAST   No orders of the defined types were placed in this encounter.   Face-to-face time spent with patient was 30 minutes. Greater than 50% of time was spent in counseling and coordination of care.  Follow-Up Instructions: Return in about 4 weeks (around 05/16/2018) for Osteoarthritis,  Myofascial pain.   Bo Merino, MD  Note - This record has been created using Editor, commissioning.  Chart creation errors have been sought, but may not always  have been located. Such creation errors do not reflect on  the standard of medical care.

## 2018-04-06 ENCOUNTER — Ambulatory Visit (INDEPENDENT_AMBULATORY_CARE_PROVIDER_SITE_OTHER): Payer: BLUE CROSS/BLUE SHIELD | Admitting: *Deleted

## 2018-04-06 DIAGNOSIS — J309 Allergic rhinitis, unspecified: Secondary | ICD-10-CM | POA: Diagnosis not present

## 2018-04-12 DIAGNOSIS — M19072 Primary osteoarthritis, left ankle and foot: Secondary | ICD-10-CM | POA: Diagnosis not present

## 2018-04-18 ENCOUNTER — Ambulatory Visit (INDEPENDENT_AMBULATORY_CARE_PROVIDER_SITE_OTHER): Payer: Self-pay

## 2018-04-18 ENCOUNTER — Encounter: Payer: Self-pay | Admitting: Rheumatology

## 2018-04-18 ENCOUNTER — Ambulatory Visit: Payer: BLUE CROSS/BLUE SHIELD | Admitting: Rheumatology

## 2018-04-18 VITALS — BP 126/70 | HR 75 | Resp 12 | Ht 61.0 in | Wt 175.8 lb

## 2018-04-18 DIAGNOSIS — M19071 Primary osteoarthritis, right ankle and foot: Secondary | ICD-10-CM

## 2018-04-18 DIAGNOSIS — J4530 Mild persistent asthma, uncomplicated: Secondary | ICD-10-CM

## 2018-04-18 DIAGNOSIS — M79672 Pain in left foot: Secondary | ICD-10-CM | POA: Diagnosis not present

## 2018-04-18 DIAGNOSIS — M19041 Primary osteoarthritis, right hand: Secondary | ICD-10-CM

## 2018-04-18 DIAGNOSIS — M7918 Myalgia, other site: Secondary | ICD-10-CM

## 2018-04-18 DIAGNOSIS — M19042 Primary osteoarthritis, left hand: Secondary | ICD-10-CM

## 2018-04-18 DIAGNOSIS — F331 Major depressive disorder, recurrent, moderate: Secondary | ICD-10-CM

## 2018-04-18 DIAGNOSIS — Z86010 Personal history of colonic polyps: Secondary | ICD-10-CM

## 2018-04-18 DIAGNOSIS — G5602 Carpal tunnel syndrome, left upper limb: Secondary | ICD-10-CM

## 2018-04-18 DIAGNOSIS — Z8639 Personal history of other endocrine, nutritional and metabolic disease: Secondary | ICD-10-CM

## 2018-04-18 DIAGNOSIS — Z87898 Personal history of other specified conditions: Secondary | ICD-10-CM

## 2018-04-18 DIAGNOSIS — M19072 Primary osteoarthritis, left ankle and foot: Secondary | ICD-10-CM

## 2018-04-18 DIAGNOSIS — H1010 Acute atopic conjunctivitis, unspecified eye: Secondary | ICD-10-CM

## 2018-04-18 DIAGNOSIS — E559 Vitamin D deficiency, unspecified: Secondary | ICD-10-CM

## 2018-04-18 DIAGNOSIS — J309 Allergic rhinitis, unspecified: Secondary | ICD-10-CM

## 2018-04-18 DIAGNOSIS — R21 Rash and other nonspecific skin eruption: Secondary | ICD-10-CM

## 2018-04-19 ENCOUNTER — Ambulatory Visit (HOSPITAL_COMMUNITY)
Admission: RE | Admit: 2018-04-19 | Discharge: 2018-04-19 | Disposition: A | Discharge status: Home / Self Care | Payer: BLUE CROSS/BLUE SHIELD | Source: Ambulatory Visit | Attending: Rheumatology | Admitting: Rheumatology

## 2018-04-19 DIAGNOSIS — M79672 Pain in left foot: Secondary | ICD-10-CM

## 2018-04-19 DIAGNOSIS — M19071 Primary osteoarthritis, right ankle and foot: Secondary | ICD-10-CM | POA: Insufficient documentation

## 2018-04-19 DIAGNOSIS — M7989 Other specified soft tissue disorders: Secondary | ICD-10-CM | POA: Diagnosis not present

## 2018-04-19 DIAGNOSIS — M19072 Primary osteoarthritis, left ankle and foot: Secondary | ICD-10-CM | POA: Diagnosis not present

## 2018-04-20 NOTE — Progress Notes (Signed)
Please notify patient that the MRI of the foot is consistent with stress reaction.  She would benefit from wearing a walking shoe.  She may come and get the prescription.  And she can schedule follow-up visit with Dr. Durward Fortes.

## 2018-04-23 ENCOUNTER — Telehealth: Payer: Self-pay | Admitting: Rheumatology

## 2018-04-23 NOTE — Telephone Encounter (Signed)
Patient advised that the MRI of the foot is consistent with stress reaction. Patient advised would benefit from wearing a walking shoe.  She will come to the office to pick up prescription.  And will schedule follow-up visit with Dr. Durward Fortes.

## 2018-04-23 NOTE — Telephone Encounter (Signed)
Patient left a voicemail requesting a return call to get the results of her MRI.

## 2018-04-24 ENCOUNTER — Encounter (INDEPENDENT_AMBULATORY_CARE_PROVIDER_SITE_OTHER): Payer: Self-pay | Admitting: Orthopaedic Surgery

## 2018-04-24 ENCOUNTER — Ambulatory Visit (INDEPENDENT_AMBULATORY_CARE_PROVIDER_SITE_OTHER): Payer: BLUE CROSS/BLUE SHIELD | Admitting: Orthopaedic Surgery

## 2018-04-24 VITALS — BP 132/73 | HR 83 | Ht 61.0 in | Wt 175.0 lb

## 2018-04-24 DIAGNOSIS — M79672 Pain in left foot: Secondary | ICD-10-CM | POA: Diagnosis not present

## 2018-04-24 NOTE — Progress Notes (Signed)
Office Visit Note   Patient: Tina Williams           Date of Birth: [birth year]           MRN: [id number] Visit Date: 04/24/2018              Requested by: Midge Minium, MD [address] [place], [place] [address] PCP: Midge Minium, MD   Assessment & Plan: Visit Diagnoses:  1. Left foot pain     Plan: Stress reaction of left third metatarsal.  Comfortable in wooden shoe.  Will reevaluate with x-rays in 2 weeks.  No work until Thursday, November 7.  Discussion regarding the stress reaction fact that she may have trouble for 4 to 6 weeks.  Follow-Up Instructions: Return in about 2 weeks (around 05/08/2018).   Orders:  No orders of the defined types were placed in this encounter.  No orders of the defined types were placed in this encounter.     Procedures: No procedures performed   Clinical Data: No additional findings.   Subjective: Chief Complaint  Patient presents with  . New Patient (Initial Visit)    l foot pain for 2 weeks not sure how she hurt it, went to urgent care was told it wasnt broken seen deveshwar had mri and referred to Deidre Carino  Tina Williams experienced insidious onset of left foot pain about 2-1/2 to 3 weeks ago.  No injury or trauma.  She did see Dr. Estanislado Pandy who ordered x-rays.  Films were negative.  Because of persistent pain and swelling an MRI scan was demonstrating a stress reaction of the distal portion of the third metatarsal with some fluid within the the soft tissue.  She has been placed in a wooden shoe and is comfortable.  Her job requires her to be in a covered shoe before she can return.  HPI  Review of Systems  Constitutional: Negative for fatigue and fever.  HENT: Negative for ear pain.   Eyes: Negative for pain.  Respiratory: Negative for cough and shortness of breath.   Cardiovascular: Negative for leg swelling.  Gastrointestinal: Positive for constipation. Negative for diarrhea.  Genitourinary: Negative for  difficulty urinating.  Musculoskeletal: Negative for back pain and neck pain.  Skin: Positive for rash.  Allergic/Immunologic: Positive for food allergies.  Neurological: Positive for weakness. Negative for numbness.  Hematological: Bruises/bleeds easily.  Psychiatric/Behavioral: Negative for sleep disturbance.     Objective: Vital Signs: BP 132/73 (BP Location: Right Arm, Patient Position: Sitting, Cuff Size: Normal)   Pulse 83   Ht 5\' 1"  (1.549 m)   Wt 175 lb (79.4 kg)   BMI 33.07 kg/m   Physical Exam  Constitutional: She is oriented to person, place, and time. She appears well-developed and well-nourished.  HENT:  Mouth/Throat: Oropharynx is clear and moist.  Eyes: Pupils are equal, round, and reactive to light. EOM are normal.  Pulmonary/Chest: Effort normal.  Neurological: She is alert and oriented to person, place, and time.  Skin: Skin is warm and dry.  Psychiatric: She has a normal mood and affect. Her behavior is normal.    Ortho Exam awake alert and oriented x3.  Comfortable sitting.  Walks comfortably and without a limp in a wooden shoe the left foot.  Does have some tenderness over the third metatarsal distally.  No mild swelling of the toe.  Good capillary refill.  Normal sensibility.  Specialty Comments:  No specialty comments available.  Imaging: No results found.  PMFS History: Patient Active Problem List   Diagnosis Date Noted  . Chest pain in adult 01/15/2018  . Palpitation 11/28/2017  . ANA positive 11/28/2017  . Fatigue 11/27/2017  . Mild persistent asthma without complication 47/42/5956  . Vertigo 09/27/2017  . Allergic rhinoconjunctivitis 09/27/2017  . Tinnitus of right ear 09/27/2017  . Vitamin D deficiency 10/28/2016  . Hx of adenomatous polyp of colon 01/11/2016  . Respiratory tract infection 03/04/2015  . H/O oralPollinosis 03/04/2015  . Oral allergy syndrome, subsequent encounter 03/04/2015  . Abdominal pain, epigastric 05/13/2014  .  Moderate recurrent major depression ([place]) 01/06/2014  . Polyarthralgia 10/25/2012  . Hyperlipidemia 03/23/2011  . General medical examination 03/23/2011  . Personal history of colonic adenoma  11/26/2009  . MYALGIA 11/24/2009  . FOOT PAIN, BILATERAL 10/28/2009  . RHINITIS 10/09/2008  . ASTHMA 10/09/2008  . OBESITY 07/09/2008  . MENOPAUSE, EARLY 07/09/2008   Past Medical History:  Diagnosis Date  . Abdominal pain, epigastric 05/13/2014  . Allergic rhinoconjunctivitis 09/27/2017  . Allergy   . Asthma   . ASTHMA 10/09/2008   HFA 75% p coaching  09/27/10    . Depression   . Early menopause   . Fatigue 11/27/2017  . FOOT PAIN, BILATERAL 10/28/2009   Qualifier: Diagnosis of  By: Birdie Riddle MD, Belenda Cruise    . General medical examination 03/23/2011  . H/O oralPollinosis 03/04/2015  . Hx of adenomatous polyp of colon 01/11/2016  . Hyperlipidemia   . MENOPAUSE, EARLY 07/09/2008   Qualifier: Diagnosis of  By: Ronnald Ramp CMA, Chemira    . Mild persistent asthma without complication 09/02/7562  . Moderate recurrent major depression ([place]) 01/06/2014  . MYALGIA 11/24/2009   Qualifier: Diagnosis of  By: Birdie Riddle MD, Belenda Cruise    . Non-allergic rhinitis   . Obesity   . OBESITY 07/09/2008   Qualifier: Diagnosis of  By: Birdie Riddle MD, Belenda Cruise    . Oral allergy syndrome, subsequent encounter 03/04/2015  . Osteoarthritis   . Personal history of colonic adenoma  11/26/2009  . Polyarthralgia 10/25/2012  . Respiratory tract infection 03/04/2015  . RHINITIS 10/09/2008   Qualifier: Diagnosis of  By: Birdie Riddle MD, Belenda Cruise    . Sinusitis acute   . Tinnitus of right ear 09/27/2017  . URI (upper respiratory infection)   . Vertigo 09/27/2017  . Vitamin D deficiency 10/28/2016    Family History  Problem Relation Age of Onset  . Epilepsy Sister   . Colon cancer Sister 3  . Hypertension Mother   . Diabetes Mother   . Diabetes Brother   . Hypertension Brother   . Lung cancer Sister        was a smoker  . Diabetes Sister   . Emphysema  Father        was a smoker  . Healthy Son   . Healthy Son   . Heart Problems Son        age 32  . Healthy Son     Past Surgical History:  Procedure Laterality Date  . CESAREAN SECTION  1988  . KNEE ARTHROPLASTY    . KNEE SURGERY Right 1998  . [place] EXTRACTION  1984   Social History   Occupational History  . Occupation: Teacher's Aid  Tobacco Use  . Smoking status: Former Smoker    Packs/day: 0.50    Years: 18.00    Pack years: 9.00    Types: Cigarettes    Last attempt to quit: 06/28/1999    Years since quitting: 18.8  .  Smokeless tobacco: Never Used  Substance and Sexual Activity  . Alcohol use: Yes    Alcohol/week: 7.0 standard drinks    Types: 7 Glasses of wine per week  . Drug use: Never  . Sexual activity: Not on file

## 2018-04-27 ENCOUNTER — Ambulatory Visit (INDEPENDENT_AMBULATORY_CARE_PROVIDER_SITE_OTHER): Payer: BLUE CROSS/BLUE SHIELD | Admitting: *Deleted

## 2018-04-27 DIAGNOSIS — J309 Allergic rhinitis, unspecified: Secondary | ICD-10-CM | POA: Diagnosis not present

## 2018-05-04 ENCOUNTER — Other Ambulatory Visit: Payer: Self-pay | Admitting: Allergy and Immunology

## 2018-05-07 ENCOUNTER — Ambulatory Visit (INDEPENDENT_AMBULATORY_CARE_PROVIDER_SITE_OTHER): Payer: Self-pay

## 2018-05-07 ENCOUNTER — Ambulatory Visit (INDEPENDENT_AMBULATORY_CARE_PROVIDER_SITE_OTHER): Payer: BLUE CROSS/BLUE SHIELD | Admitting: Orthopaedic Surgery

## 2018-05-07 ENCOUNTER — Encounter (INDEPENDENT_AMBULATORY_CARE_PROVIDER_SITE_OTHER): Payer: Self-pay | Admitting: Orthopaedic Surgery

## 2018-05-07 VITALS — BP 110/83 | HR 69 | Ht 61.0 in | Wt 175.0 lb

## 2018-05-07 DIAGNOSIS — M25572 Pain in left ankle and joints of left foot: Secondary | ICD-10-CM

## 2018-05-07 HISTORY — DX: Pain in left ankle and joints of left foot: M25.572

## 2018-05-07 NOTE — Progress Notes (Signed)
Office Visit Note   Patient: Tina Williams           Date of Birth: 09/02/1960           MRN: [id number] Visit Date: 05/07/2018              Requested by: Midge Minium, MD [address] [place], [place] [address] PCP: Midge Minium, MD   Assessment & Plan: Visit Diagnoses:  1. Pain in left ankle and joints of left foot     Plan: Prior MRI scan demonstrated a stress reaction of the third metatarsal.  Films today did not reveal any abnormality about the third metatarsal.  Feeling better and wooden shoe.  Suspect her problem will resolve over the next 3 to 4 weeks and will wear comfortable shoe wearing.  Has been performing light duty activity at work when she is sitting more than standing and would suggest she continue with that until her pain resolves  Follow-Up Instructions: Return if symptoms worsen or fail to improve.   Orders:  Orders Placed This Encounter  Procedures  . XR Foot Complete Left   No orders of the defined types were placed in this encounter.     Procedures: No procedures performed   Clinical Data: No additional findings.   Subjective: Chief Complaint  Patient presents with  . Follow-up    2 WK F/U L FOOT PAIN GETTING BETTER, WEARING POST OP SHOE   Feeling better in terms of her left foot in the wooden shoe.  HPI  Review of Systems  Constitutional: Negative for fatigue and fever.  HENT: Negative for ear pain.   Eyes: Negative for pain.  Respiratory: Negative for cough and shortness of breath.   Cardiovascular: Negative for leg swelling.  Gastrointestinal: Positive for constipation. Negative for diarrhea.  Genitourinary: Negative for difficulty urinating.  Musculoskeletal: Negative for back pain and neck pain.  Skin: Negative for rash.  Allergic/Immunologic: Positive for food allergies.  Neurological: Positive for weakness. Negative for numbness.  Hematological: Bruises/bleeds easily.  Psychiatric/Behavioral: Negative for  sleep disturbance.     Objective: Vital Signs: BP 110/83 (BP Location: Right Arm, Patient Position: Sitting, Cuff Size: Normal)   Pulse 69   Ht 5\' 1"  (1.549 m)   Wt 175 lb (79.4 kg)   BMI 33.07 kg/m   Physical Exam  Constitutional: She is oriented to person, place, and time. She appears well-developed and well-nourished.  HENT:  Mouth/Throat: Oropharynx is clear and moist.  Eyes: Pupils are equal, round, and reactive to light. EOM are normal.  Pulmonary/Chest: Effort normal.  Neurological: She is alert and oriented to person, place, and time.  Skin: Skin is warm and dry.  Psychiatric: She has a normal mood and affect. Her behavior is normal.    Ortho Exam awake alert and oriented x3 comfortable sitting.  Some pain with ambulation left foot with minimal limp.  Comfortable in a wooden shoe.  Still some tenderness in the area of the third metatarsal distally.  No toe edema  Specialty Comments:  No specialty comments available.  Imaging: Xr Foot Complete Left  Result Date: 05/07/2018 Films of the left foot were negative for any acute change.  There was a stress reaction of the third metatarsal on MRI scan but not visible on plain films    PMFS History: Patient Active Problem List   Diagnosis Date Noted  . Pain in left ankle and joints of left foot 05/07/2018  . Chest pain  in adult 01/15/2018  . Palpitation 11/28/2017  . ANA positive 11/28/2017  . Fatigue 11/27/2017  . Mild persistent asthma without complication 57/32/2025  . Vertigo 09/27/2017  . Allergic rhinoconjunctivitis 09/27/2017  . Tinnitus of right ear 09/27/2017  . Vitamin D deficiency 10/28/2016  . Hx of adenomatous polyp of colon 01/11/2016  . Respiratory tract infection 03/04/2015  . H/O oralPollinosis 03/04/2015  . Oral allergy syndrome, subsequent encounter 03/04/2015  . Abdominal pain, epigastric 05/13/2014  . Moderate recurrent major depression ([place]) 01/06/2014  . Polyarthralgia 10/25/2012  .  Hyperlipidemia 03/23/2011  . General medical examination 03/23/2011  . Personal history of colonic adenoma  11/26/2009  . MYALGIA 11/24/2009  . FOOT PAIN, BILATERAL 10/28/2009  . RHINITIS 10/09/2008  . ASTHMA 10/09/2008  . OBESITY 07/09/2008  . MENOPAUSE, EARLY 07/09/2008   Past Medical History:  Diagnosis Date  . Abdominal pain, epigastric 05/13/2014  . Allergic rhinoconjunctivitis 09/27/2017  . Allergy   . Asthma   . ASTHMA 10/09/2008   HFA 75% p coaching  09/27/10    . Depression   . Early menopause   . Fatigue 11/27/2017  . FOOT PAIN, BILATERAL 10/28/2009   Qualifier: Diagnosis of  By: Birdie Riddle MD, Belenda Cruise    . General medical examination 03/23/2011  . H/O oralPollinosis 03/04/2015  . Hx of adenomatous polyp of colon 01/11/2016  . Hyperlipidemia   . MENOPAUSE, EARLY 07/09/2008   Qualifier: Diagnosis of  By: Ronnald Ramp CMA, Chemira    . Mild persistent asthma without complication 09/26/7060  . Moderate recurrent major depression ([place]) 01/06/2014  . MYALGIA 11/24/2009   Qualifier: Diagnosis of  By: Birdie Riddle MD, Belenda Cruise    . Non-allergic rhinitis   . Obesity   . OBESITY 07/09/2008   Qualifier: Diagnosis of  By: Birdie Riddle MD, Belenda Cruise    . Oral allergy syndrome, subsequent encounter 03/04/2015  . Osteoarthritis   . Personal history of colonic adenoma  11/26/2009  . Polyarthralgia 10/25/2012  . Respiratory tract infection 03/04/2015  . RHINITIS 10/09/2008   Qualifier: Diagnosis of  By: Birdie Riddle MD, Belenda Cruise    . Sinusitis acute   . Tinnitus of right ear 09/27/2017  . URI (upper respiratory infection)   . Vertigo 09/27/2017  . Vitamin D deficiency 10/28/2016    Family History  Problem Relation Age of Onset  . Epilepsy Sister   . Colon cancer Sister 24  . Hypertension Mother   . Diabetes Mother   . Diabetes Brother   . Hypertension Brother   . Lung cancer Sister        was a smoker  . Diabetes Sister   . Emphysema Father        was a smoker  . Healthy Son   . Healthy Son   . Heart Problems Son          age 61  . Healthy Son     Past Surgical History:  Procedure Laterality Date  . CESAREAN SECTION  1988  . KNEE ARTHROPLASTY    . KNEE SURGERY Right 1998  . [place] EXTRACTION  1984   Social History   Occupational History  . Occupation: Teacher's Aid  Tobacco Use  . Smoking status: Former Smoker    Packs/day: 0.50    Years: 18.00    Pack years: 9.00    Types: Cigarettes    Last attempt to quit: 06/28/1999    Years since quitting: 18.8  . Smokeless tobacco: Never Used  Substance and Sexual Activity  . Alcohol use:  Yes    Alcohol/week: 7.0 standard drinks    Types: 7 Glasses of wine per week  . Drug use: Never  . Sexual activity: Not on file

## 2018-05-08 ENCOUNTER — Other Ambulatory Visit: Payer: Self-pay | Admitting: Family Medicine

## 2018-05-16 NOTE — Progress Notes (Signed)
Office Visit Note  Patient: Tina Williams             Date of Birth: [birth year]           MRN: [id number]             PCP: Midge Minium, MD Referring: Midge Minium, MD Visit Date: 05/30/2018 Occupation: @GUAROCC @  Subjective:  Generalized muscle aches   History of Present Illness: Tina Williams is a 57 y.o. female with history of osteoarthritis and myofascial pain syndrome.  Patient reports that she is been having increased muscle aches and muscle tenderness.  She states that she had a fibromyalgia flare last week.  She states that she seems to flare worse with weather changes.  She states that she continues to take Cymbalta 60 mg 1 tablet by mouth daily but was previously taking 90.  She states that her pain is increased since decreasing the dose of Cymbalta.  She denies any muscle cramps or muscle spasms at this time.  She has been taking Tylenol and Advil for pain relief which have not been effective.  She reports that she has been having increased pain in bilateral hands bilateral wrists and bilateral feet.  She denies any joint swelling.  She feels as though her fatigue has been worsening recently.  She states that she typically sleeps well at night and gets about 6 to 7 hours. She reports that her left foot stress fracture causes occasional discomfort but is improved significantly.  She is no longer wearing a boot or shoe.  She denies any joint swelling or bruising.  She states she is no longer following up with Dr. Durward Fortes.    Activities of Daily Living:  Patient reports morning stiffness for 30 minutes.   Patient Denies nocturnal pain.  Difficulty dressing/grooming: Denies Difficulty climbing stairs: Denies Difficulty getting out of chair: Denies Difficulty using hands for taps, buttons, cutlery, and/or writing: Denies  Review of Systems  Constitutional: Positive for fatigue.  HENT: Negative for mouth sores, trouble swallowing, trouble swallowing, mouth dryness and  nose dryness.   Eyes: Negative for pain, redness, itching, visual disturbance and dryness.  Respiratory: Negative for cough, hemoptysis, shortness of breath, wheezing and difficulty breathing.   Cardiovascular: Negative for chest pain, palpitations, hypertension and swelling in legs/feet.  Gastrointestinal: Positive for constipation. Negative for abdominal pain, blood in stool, diarrhea, nausea and vomiting.  Endocrine: Negative for increased urination.  Genitourinary: Negative for painful urination and nocturia.  Musculoskeletal: Positive for arthralgias, joint pain and morning stiffness. Negative for joint swelling, myalgias, muscle weakness, muscle tenderness and myalgias.  Skin: Positive for rash. Negative for color change, pallor, hair loss, nodules/bumps, skin tightness, ulcers and sensitivity to sunlight.  Allergic/Immunologic: Negative for susceptible to infections.  Neurological: Negative for dizziness, tremors, light-headedness, numbness, headaches, memory loss and weakness.  Hematological: Negative for swollen glands.  Psychiatric/Behavioral: Negative for depressed mood, confusion and sleep disturbance. The patient is not nervous/anxious.     PMFS History:  Patient Active Problem List   Diagnosis Date Noted  . Pain in left ankle and joints of left foot 05/07/2018  . Chest pain in adult 01/15/2018  . Palpitation 11/28/2017  . ANA positive 11/28/2017  . Fatigue 11/27/2017  . Mild persistent asthma without complication 81/44/8185  . Vertigo 09/27/2017  . Allergic rhinoconjunctivitis 09/27/2017  . Tinnitus of right ear 09/27/2017  . Vitamin D deficiency 10/28/2016  . Hx of adenomatous polyp of colon 01/11/2016  . Respiratory tract infection 03/04/2015  .  H/O oralPollinosis 03/04/2015  . Oral allergy syndrome, subsequent encounter 03/04/2015  . Abdominal pain, epigastric 05/13/2014  . Moderate recurrent major depression ([place]) 01/06/2014  . Polyarthralgia 10/25/2012  .  Hyperlipidemia 03/23/2011  . General medical examination 03/23/2011  . Personal history of colonic adenoma  11/26/2009  . MYALGIA 11/24/2009  . FOOT PAIN, BILATERAL 10/28/2009  . RHINITIS 10/09/2008  . ASTHMA 10/09/2008  . OBESITY 07/09/2008  . MENOPAUSE, EARLY 07/09/2008    Past Medical History:  Diagnosis Date  . Abdominal pain, epigastric 05/13/2014  . Allergic rhinoconjunctivitis 09/27/2017  . Allergy   . Asthma   . ASTHMA 10/09/2008   HFA 75% p coaching  09/27/10    . Depression   . Early menopause   . Fatigue 11/27/2017  . FOOT PAIN, BILATERAL 10/28/2009   Qualifier: Diagnosis of  By: Birdie Riddle MD, Belenda Cruise    . General medical examination 03/23/2011  . H/O oralPollinosis 03/04/2015  . Hx of adenomatous polyp of colon 01/11/2016  . Hyperlipidemia   . MENOPAUSE, EARLY 07/09/2008   Qualifier: Diagnosis of  By: Ronnald Ramp CMA, [place]    . Mild persistent asthma without complication 02/26/1193  . Moderate recurrent major depression ([place]) 01/06/2014  . MYALGIA 11/24/2009   Qualifier: Diagnosis of  By: Birdie Riddle MD, Belenda Cruise    . Non-allergic rhinitis   . Obesity   . OBESITY 07/09/2008   Qualifier: Diagnosis of  By: Birdie Riddle MD, Belenda Cruise    . Oral allergy syndrome, subsequent encounter 03/04/2015  . Osteoarthritis   . Personal history of colonic adenoma  11/26/2009  . Polyarthralgia 10/25/2012  . Respiratory tract infection 03/04/2015  . RHINITIS 10/09/2008   Qualifier: Diagnosis of  By: Birdie Riddle MD, Belenda Cruise    . Sinusitis acute   . Tinnitus of right ear 09/27/2017  . URI (upper respiratory infection)   . Vertigo 09/27/2017  . Vitamin D deficiency 10/28/2016    Family History  Problem Relation Age of Onset  . Epilepsy Sister   . Colon cancer Sister 23  . Hypertension Mother   . Diabetes Mother   . Diabetes Brother   . Hypertension Brother   . Lung cancer Sister        was a smoker  . Diabetes Sister   . Emphysema Father        was a smoker  . Healthy Son   . Healthy Son   . Heart Problems Son          age 68  . Healthy Son    Past Surgical History:  Procedure Laterality Date  . CESAREAN SECTION  1988  . KNEE ARTHROPLASTY    . KNEE SURGERY Right 1998  . [place]er EXTRACTION  1984   Social History   Social History Narrative  . Not on file    Objective: Vital Signs: BP 126/75 (BP Location: Left Arm, Patient Position: Sitting, Cuff Size: Normal)   Pulse 77   Resp 12   Ht 5\' 1"  (1.549 m)   Wt 171 lb 3.2 oz (77.7 kg)   BMI 32.35 kg/m    Physical Exam  Constitutional: She is oriented to person, place, and time. She appears well-developed and well-nourished.  HENT:  Head: Normocephalic and atraumatic.  Eyes: Conjunctivae and EOM are normal.  Neck: Normal range of motion.  Cardiovascular: Normal rate, regular rhythm, normal heart sounds and intact distal pulses.  Pulmonary/Chest: Effort normal and breath sounds normal.  Abdominal: Soft. Bowel sounds are normal.  Lymphadenopathy:    She has  no cervical adenopathy.  Neurological: She is alert and oriented to person, place, and time.  Skin: Skin is warm and dry. Capillary refill takes less than 2 seconds.  Psychiatric: She has a normal mood and affect. Her behavior is normal.  Nursing note and vitals reviewed.    Musculoskeletal Exam: C-spine, thoracic spine, lumbar spine good range of motion.  No midline spinal tenderness.  No SI joint tenderness.  Shoulder joints, elbow joints, wrist joints, MCPs and PIPs, DIPs good range of motion no synovitis.  She has mild PIP and DIP synovial thickening consistent with osteoarthritis of bilateral hands.  Hip joints, knee joints, ankle joints, MTPs, PIPs, DIPs good range of motion no synovitis.  No warmth or effusion bilateral knee joints.  No tenderness or swelling of ankle joints.  No tenderness of MTP joints.  No tenderness of trochanteric bursa bilaterally.  She has right piriformis tenderness.  CDAI Exam: CDAI Score: Not documented Patient Global Assessment: Not documented;  Provider Global Assessment: Not documented Swollen: Not documented; Tender: Not documented Joint Exam   Not documented   There is currently no information documented on the homunculus. Go to the Rheumatology activity and complete the homunculus joint exam.  Investigation: No additional findings.  Imaging: Xr Foot Complete Left  Result Date: 05/07/2018 Films of the left foot were negative for any acute change.  There was a stress reaction of the third metatarsal on MRI scan but not visible on plain films   Recent Labs: Lab Results  Component Value Date   WBC 6.9 11/14/2017   HGB 13.2 11/14/2017   PLT 434.0 (H) 11/14/2017   NA 140 11/14/2017   K 4.5 11/14/2017   CL 102 11/14/2017   CO2 31 11/14/2017   GLUCOSE 105 (H) 11/14/2017   BUN 12 11/14/2017   CREATININE 0.59 11/14/2017   BILITOT 0.6 11/14/2017   ALKPHOS 90 11/14/2017   AST 12 11/14/2017   ALT 12 11/14/2017   PROT 6.9 12/15/2017   ALBUMIN 4.4 11/14/2017   CALCIUM 9.7 11/14/2017    Speciality Comments: No specialty comments available.  Procedures:  No procedures performed Allergies: Latex; Amoxicillin; Clarithromycin; Naproxen sodium; Penicillins; and Sulfa antibiotics   Assessment / Plan:     Visit Diagnoses: Left foot pain - She has no discomfort at this time.  MRI of the left foot on 04/19/2018 revealed findings consistent with stress reaction of the third metatarsal.  She was evaluated by Dr. Durward Fortes but is no longer following up due to clinical improvement.  She had an x-ray on 05/07/2018 that did not show a visible stress reaction.  She is not having any discomfort at this time.  No tenderness on exam.  She is wearing proper fitting shoes.  Primary osteoarthritis of both hands - All autoimmune work-up negative, ultrasound negative for synovitis.  She has mild PIP and DIP synovial thickening consistent with osteoarthritis.  She is given a handout of exercises she can perform.  joint protection and muscle  strengthening were discussed.   Primary osteoarthritis of both feet: She has first MTP synovial thickening bilaterally.  No tenderness of MTPs.  She has no discomfort in her feet at this time.  She wears proper fitting shoes.  Carpal tunnel syndrome, left upper limb - Improved. She was previously using a brace which helped.   Myofascial pain: She has generalized muscle aches and muscle tenderness.  She has generalized hyperalgesia on exam.  Her fibromyalgia started flaring 1 week ago.  Her pain is worsened  by weather changes.  She declined massage therapy.  She does not use heat therapy.  She declined physical therapy at this time.  She will continue taking Cymbalta 60 mg 1 tablet by mouth daily.  She takes Advil and Tylenol for pain relief which are ineffective.  She was encouraged to try to stay active exercise on a regular basis.  We discussed water aerobics.  She is advised to let us know if she changes her mind about physical therapy.  Piriformis syndrome, right: She has tenderness in the right piriformis region.  She declined a cortisone injection today in the office.  She is given a handout of exercises that she can perform at home.  She declined physical therapy at this time.  Rash and other nonspecific skin eruption: She has a persistent rash on her torso.  She would like a referral to [facility] dermatology.  We will place a referral today.  Other medical conditions are listed as follows:   Mild persistent asthma without complication  History of palpitations  Vitamin D deficiency  Personal history of colonic adenoma   Allergic rhinoconjunctivitis  History of hyperlipidemia  History of vertigo  Moderate recurrent major depression ([place])   Orders: No orders of the defined types were placed in this encounter.  No orders of the defined types were placed in this encounter.   Face-to-face time spent with patient was 30 minutes. Greater than 50% of time was spent in counseling  and coordination of care.  Follow-Up Instructions: Return in about 6 months (around 11/29/2018) for Osteoarthritis, Myofascial pain .   Ofilia Neas, PA-C  Note - This record has been created using Dragon software.  Chart creation errors have been sought, but may not always  have been located. Such creation errors do not reflect on  the standard of medical care.

## 2018-05-17 DIAGNOSIS — Z1231 Encounter for screening mammogram for malignant neoplasm of breast: Secondary | ICD-10-CM | POA: Diagnosis not present

## 2018-05-17 DIAGNOSIS — Z1239 Encounter for other screening for malignant neoplasm of breast: Secondary | ICD-10-CM | POA: Diagnosis not present

## 2018-05-17 LAB — HM MAMMOGRAPHY

## 2018-05-18 ENCOUNTER — Ambulatory Visit (INDEPENDENT_AMBULATORY_CARE_PROVIDER_SITE_OTHER): Payer: BLUE CROSS/BLUE SHIELD | Admitting: *Deleted

## 2018-05-18 DIAGNOSIS — J309 Allergic rhinitis, unspecified: Secondary | ICD-10-CM

## 2018-05-23 ENCOUNTER — Encounter: Payer: Self-pay | Admitting: General Practice

## 2018-05-30 ENCOUNTER — Encounter: Payer: Self-pay | Admitting: Physician Assistant

## 2018-05-30 ENCOUNTER — Ambulatory Visit: Payer: BLUE CROSS/BLUE SHIELD | Admitting: Physician Assistant

## 2018-05-30 VITALS — BP 126/75 | HR 77 | Resp 12 | Ht 61.0 in | Wt 171.2 lb

## 2018-05-30 DIAGNOSIS — E559 Vitamin D deficiency, unspecified: Secondary | ICD-10-CM

## 2018-05-30 DIAGNOSIS — M19041 Primary osteoarthritis, right hand: Secondary | ICD-10-CM

## 2018-05-30 DIAGNOSIS — Z860100 Personal history of colon polyps, unspecified: Secondary | ICD-10-CM

## 2018-05-30 DIAGNOSIS — M19071 Primary osteoarthritis, right ankle and foot: Secondary | ICD-10-CM

## 2018-05-30 DIAGNOSIS — G5701 Lesion of sciatic nerve, right lower limb: Secondary | ICD-10-CM

## 2018-05-30 DIAGNOSIS — M79672 Pain in left foot: Secondary | ICD-10-CM | POA: Diagnosis not present

## 2018-05-30 DIAGNOSIS — M19042 Primary osteoarthritis, left hand: Secondary | ICD-10-CM

## 2018-05-30 DIAGNOSIS — F331 Major depressive disorder, recurrent, moderate: Secondary | ICD-10-CM

## 2018-05-30 DIAGNOSIS — Z8639 Personal history of other endocrine, nutritional and metabolic disease: Secondary | ICD-10-CM

## 2018-05-30 DIAGNOSIS — G5602 Carpal tunnel syndrome, left upper limb: Secondary | ICD-10-CM | POA: Diagnosis not present

## 2018-05-30 DIAGNOSIS — M19072 Primary osteoarthritis, left ankle and foot: Secondary | ICD-10-CM

## 2018-05-30 DIAGNOSIS — H1010 Acute atopic conjunctivitis, unspecified eye: Secondary | ICD-10-CM

## 2018-05-30 DIAGNOSIS — R21 Rash and other nonspecific skin eruption: Secondary | ICD-10-CM

## 2018-05-30 DIAGNOSIS — M7918 Myalgia, other site: Secondary | ICD-10-CM

## 2018-05-30 DIAGNOSIS — J4530 Mild persistent asthma, uncomplicated: Secondary | ICD-10-CM

## 2018-05-30 DIAGNOSIS — J309 Allergic rhinitis, unspecified: Secondary | ICD-10-CM

## 2018-05-30 DIAGNOSIS — Z87898 Personal history of other specified conditions: Secondary | ICD-10-CM

## 2018-05-30 NOTE — Patient Instructions (Addendum)
Hand Exercises Hand exercises can be helpful to almost anyone. These exercises can strengthen the hands, improve flexibility and movement, and increase blood flow to the hands. These results can make work and daily tasks easier. Hand exercises can be especially helpful for people who have joint pain from arthritis or have nerve damage from overuse (carpal tunnel syndrome). These exercises can also help people who have injured a hand. Most of these hand exercises are fairly gentle stretching routines. You can do them often throughout the day. Still, it is a good idea to ask your health care provider which exercises would be best for you. Warming your hands before exercise may help to reduce stiffness. You can do this with gentle massage or by placing your hands in warm water for 15 minutes. Also, make sure you pay attention to your level of hand pain as you begin an exercise routine. Exercises Knuckle Bend Repeat this exercise 5-10 times with each hand. 1. Stand or sit with your arm, hand, and all five fingers pointed straight up. Make sure your wrist is straight. 2. Gently and slowly bend your fingers down and inward until the tips of your fingers are touching the tops of your palm. 3. Hold this position for a few seconds. 4. Extend your fingers out to their original position, all pointing straight up again.  Finger Fan Repeat this exercise 5-10 times with each hand. 1. Hold your arm and hand out in front of you. Keep your wrist straight. 2. Squeeze your hand into a fist. 3. Hold this position for a few seconds. 4. Fan out, or spread apart, your hand and fingers as much as possible, stretching every joint fully.  Tabletop Repeat this exercise 5-10 times with each hand. 1. Stand or sit with your arm, hand, and all five fingers pointed straight up. Make sure your wrist is straight. 2. Gently and slowly bend your fingers at the knuckles where they meet the hand until your hand is making an  upside-down L shape. Your fingers should form a tabletop. 3. Hold this position for a few seconds. 4. Extend your fingers out to their original position, all pointing straight up again.  Making Os Repeat this exercise 5-10 times with each hand. 1. Stand or sit with your arm, hand, and all five fingers pointed straight up. Make sure your wrist is straight. 2. Make an O shape by touching your pointer finger to your thumb. Hold for a few seconds. Then open your hand wide. 3. Repeat this motion with each finger on your hand.  Table Spread Repeat this exercise 5-10 times with each hand. 1. Place your hand on a table with your palm facing down. Make sure your wrist is straight. 2. Spread your fingers out as much as possible. Hold this position for a few seconds. 3. Slide your fingers back together again. Hold for a few seconds.  Ball Grip  Repeat this exercise 10-15 times with each hand. 1. Hold a tennis ball or another soft ball in your hand. 2. While slowly increasing pressure, squeeze the ball as hard as possible. 3. Squeeze as hard as you can for 3-5 seconds. 4. Relax and repeat.  Wrist Curls Repeat this exercise 10-15 times with each hand. 1. Sit in a chair that has armrests. 2. Hold a light weight in your hand, such as a dumbbell that weighs 1-3 pounds (0.5-1.4 kg). Ask your health care provider what weight would be best for you. 3. Rest your hand just over   the end of the chair arm with your palm facing up. 4. Gently pivot your wrist up and down while holding the weight. Do not twist your wrist from side to side.  Contact a health care provider if:  Your hand pain or discomfort gets much worse when you do an exercise.  Your hand pain or discomfort does not improve within 2 hours after you exercise. If you have any of these problems, stop doing these exercises right away. Do not do them again unless your health care provider says that you can. Get help right away if:  You  develop sudden, severe hand pain. If this happens, stop doing these exercises right away. Do not do them again unless your health care provider says that you can. This information is not intended to replace advice given to you by your health care provider. Make sure you discuss any questions you have with your health care provider. Document Released: 05/25/2015 Document Revised: 11/19/2015 Document Reviewed: 12/22/2014 Elsevier Interactive Patient Education  2018 [employer]. Piriformis Syndrome Rehab Ask your health care provider which exercises are safe for you. Do exercises exactly as told by your health care provider and adjust them as directed. It is normal to feel mild stretching, pulling, tightness, or discomfort as you do these exercises, but you should stop right away if you feel sudden pain or your pain gets worse.Do not begin these exercises until told by your health care provider. Stretching and range of motion exercises These exercises warm up your muscles and joints and improve the movement and flexibility of your hip and pelvis. These exercises also help to relieve pain, numbness, and tingling. Exercise A: Hip rotators  1. Lie on your back on a firm surface. 2. Pull your left / right knee toward your same shoulder with your left / right hand until your knee is pointing toward the ceiling. Hold your left / right ankle with your other hand. 3. Keeping your knee steady, gently pull your left / right ankle toward your other shoulder until you feel a stretch in your buttocks. 4. Hold this position for __________ seconds. Repeat __________ times. Complete this stretch __________ times a day. Exercise B: Hip extensors 1. Lie on your back on a firm surface. Both of your legs should be straight. 2. Pull your left / right knee to your chest. Hold your leg in this position by holding onto the back of your thigh or the front of your knee. 3. Hold this position for __________  seconds. 4. Slowly return to the starting position. Repeat __________ times. Complete this stretch __________ times a day. Strengthening exercises These exercises build strength and endurance in your hip and thigh muscles. Endurance is the ability to use your muscles for a long time, even after they get tired. Exercise C: Straight leg raises ( hip abductors) 1. Lie on your side with your left / right leg in the top position. Lie so your head, shoulder, knee, and hip line up. Bend your bottom knee to help you balance. 2. Lift your top leg up 4-6 inches (10-15 cm), keeping your toes pointed straight ahead. 3. Hold this position for __________ seconds. 4. Slowly lower your leg to the starting position. Let your muscles relax completely. Repeat __________ times. Complete this exercise__________ times a day. Exercise D: Hip abductors and rotators, quadruped  1. Get on your hands and knees on a firm, lightly padded surface. Your hands should be directly below your shoulders, and your knees should  be directly below your hips. 2. Lift your left / right knee out to the side. Keep your knee bent. Do not twist your body. 3. Hold this position for __________ seconds. 4. Slowly lower your leg. Repeat __________ times. Complete this exercise__________ times a day. Exercise E: Straight leg raises ( hip extensors) 1. Lie on your abdomen on a bed or a firm surface with a pillow under your hips. 2. Squeeze your buttock muscles and lift your left / right thigh off the bed. Do not let your back arch. 3. Hold this position for __________ seconds. 4. Slowly return to the starting position. Let your muscles relax completely before doing another repetition. Repeat __________ times. Complete this exercise__________ times a day. This information is not intended to replace advice given to you by your health care provider. Make sure you discuss any questions you have with your health care provider. Document Released:  06/13/2005 Document Revised: 02/16/2016 Document Reviewed: 05/26/2015 Elsevier Interactive Patient Education  [employer].

## 2018-05-30 NOTE — Addendum Note (Signed)
Addended by: Earnestine Mealing on: 05/30/2018 02:40 PM   Modules accepted: Orders

## 2018-06-01 ENCOUNTER — Ambulatory Visit (INDEPENDENT_AMBULATORY_CARE_PROVIDER_SITE_OTHER): Payer: BLUE CROSS/BLUE SHIELD | Admitting: *Deleted

## 2018-06-01 DIAGNOSIS — J309 Allergic rhinitis, unspecified: Secondary | ICD-10-CM | POA: Diagnosis not present

## 2018-06-04 ENCOUNTER — Other Ambulatory Visit: Payer: Self-pay | Admitting: Allergy and Immunology

## 2018-06-05 ENCOUNTER — Telehealth: Payer: Self-pay

## 2018-06-05 MED ORDER — FLECAINIDE ACETATE 50 MG PO TABS: 50.0000 mg | ORAL_TABLET | Freq: Two times a day (BID) | ORAL | 2 refills | Status: DC

## 2018-06-05 NOTE — Telephone Encounter (Signed)
Rx sent to pharmacy as requested.

## 2018-06-29 ENCOUNTER — Ambulatory Visit (INDEPENDENT_AMBULATORY_CARE_PROVIDER_SITE_OTHER): Payer: BLUE CROSS/BLUE SHIELD

## 2018-06-29 DIAGNOSIS — J309 Allergic rhinitis, unspecified: Secondary | ICD-10-CM

## 2018-07-10 ENCOUNTER — Other Ambulatory Visit: Payer: Self-pay | Admitting: Family Medicine

## 2018-07-11 ENCOUNTER — Other Ambulatory Visit: Payer: Self-pay | Admitting: *Deleted

## 2018-07-11 MED ORDER — FLUTICASONE PROPIONATE HFA 44 MCG/ACT IN AERO: INHALATION_SPRAY | RESPIRATORY_TRACT | 3 refills | Status: DC

## 2018-07-12 ENCOUNTER — Encounter: Payer: BLUE CROSS/BLUE SHIELD | Admitting: Family Medicine

## 2018-07-13 ENCOUNTER — Ambulatory Visit (INDEPENDENT_AMBULATORY_CARE_PROVIDER_SITE_OTHER): Payer: BLUE CROSS/BLUE SHIELD | Admitting: *Deleted

## 2018-07-13 DIAGNOSIS — J309 Allergic rhinitis, unspecified: Secondary | ICD-10-CM | POA: Diagnosis not present

## 2018-07-19 NOTE — Progress Notes (Signed)
Cardiology Office Note:    Date:  07/20/2018   ID:  Tina Williams, DOB [birth year], MRN [id number]  PCP:  Midge Minium, MD  Cardiologist:  Shirlee More, MD    Referring MD: Midge Minium, MD    ASSESSMENT:    1. Palpitation   2. Atrial premature beats    PLAN:    In order of problems listed above:  1. She is quite improved with low-dose flecainide has had no side effects and after discussion agrees to remain on the drug for the next 6 months and reassess if she needs continued therapy at that visit.  To avoid over-the-counter proarrhythmic drugs.  She had many questions about why she has arrhythmia the natural history and the goals of treatment and appeared to be satisfied after discussion   Next appointment: 6 months   Medication Adjustments/Labs and Tests Ordered: Current medicines are reviewed at length with the patient today.  Concerns regarding medicines are outlined above.  Orders Placed This Encounter  Procedures  . EKG 12-Lead   No orders of the defined types were placed in this encounter.   Chief Complaint  Patient presents with  . Palpitations    with APC's    History of Present Illness:    Tina Williams is a 58 y.o. female with a hx of  palpitation with frequent APC's last seen 01/15/18 started on flecanide 01/24/18  She was last seen 02/01/2018. Compliance with diet, lifestyle and medications: yes  She is quite pleased with her response to low-dose flecainide she has had no palpitation and she found that her shortness of breath she attributed to asthma is markedly improved and uses her bronchodilator less frequently.  No edema shortness of breath chest pain palpitation or syncope. Past Medical History:  Diagnosis Date  . Abdominal pain, epigastric 05/13/2014  . Allergic rhinoconjunctivitis 09/27/2017  . Allergy   . Asthma   . ASTHMA 10/09/2008   HFA 75% p coaching  09/27/10    . Depression   . Early menopause   . Fatigue 11/27/2017  . FOOT  PAIN, BILATERAL 10/28/2009   Qualifier: Diagnosis of  By: Birdie Riddle MD, Belenda Cruise    . General medical examination 03/23/2011  . H/O oralPollinosis 03/04/2015  . Hx of adenomatous polyp of colon 01/11/2016  . Hyperlipidemia   . MENOPAUSE, EARLY 07/09/2008   Qualifier: Diagnosis of  By: Ronnald Ramp CMA, Chemira    . Mild persistent asthma without complication 0/07/4095  . Moderate recurrent major depression ([place]) 01/06/2014  . MYALGIA 11/24/2009   Qualifier: Diagnosis of  By: Birdie Riddle MD, Belenda Cruise    . Non-allergic rhinitis   . Obesity   . OBESITY 07/09/2008   Qualifier: Diagnosis of  By: Birdie Riddle MD, Belenda Cruise    . Oral allergy syndrome, subsequent encounter 03/04/2015  . Osteoarthritis   . Personal history of colonic adenoma  11/26/2009  . Polyarthralgia 10/25/2012  . Respiratory tract infection 03/04/2015  . RHINITIS 10/09/2008   Qualifier: Diagnosis of  By: Birdie Riddle MD, Belenda Cruise    . Sinusitis acute   . Tinnitus of right ear 09/27/2017  . URI (upper respiratory infection)   . Vertigo 09/27/2017  . Vitamin D deficiency 10/28/2016    Past Surgical History:  Procedure Laterality Date  . CESAREAN SECTION  1988  . KNEE ARTHROPLASTY    . KNEE SURGERY Right 1998  . WISDOM TOOTH EXTRACTION  1984    Current Medications: Current Meds  Medication Sig  . albuterol (PROAIR HFA) 108 (90 Base)  MCG/ACT inhaler Inhale into the lungs every 6 (six) hours as needed for wheezing or shortness of breath.  Darlin Priestly HFA 200 MCG/ACT AERO TAKE 2 PUFFS BY MOUTH TWICE A DAY  . cetirizine (ZYRTEC ALLERGY) 10 MG tablet Take 10 mg by mouth daily.  . Cholecalciferol (VITAMIN D-3) 5000 units TABS Take 1 tablet by mouth daily.  . cimetidine (TAGAMET) 200 MG tablet Take 1,200 mg by mouth daily.   . Cyanocobalamin (VITAMIN B-12) 1000 MCG SUBL Place 1 tablet under the tongue daily.  . diclofenac sodium (VOLTAREN) 1 % GEL Apply 3 gm to 3 large joints up to 3 times a day.Dispense 3 tubes with 3 refills.  . DULoxetine (CYMBALTA) 60 MG capsule  TAKE 1 CAPSULE BY MOUTH EVERY DAY. Please call [phone] to schedule a Physical.  . EPINEPHrine (EPIPEN 2-PAK) 0.3 mg/0.3 mL IJ SOAJ injection Use as directed for life threatening allergic reactions  . FIBER PO Take 2 tablets by mouth daily as needed.   . flecainide (TAMBOCOR) 50 MG tablet Take 1 tablet (50 mg total) by mouth 2 (two) times daily.  . fluticasone (FLOVENT HFA) 44 MCG/ACT inhaler Inhale two puffs twice daily to prevent cough or wheeze. Rinse mouth after use.  . Misc Natural Products (IMMUNE FORMULA PO) Take 1 tablet by mouth daily.  . montelukast (SINGULAIR) 10 MG tablet TAKE ONE TABLET ONCE DAILY AS DIRECTED     Allergies:   Latex; Amoxicillin; Clarithromycin; Naproxen sodium; Penicillins; and Sulfa antibiotics   Social History   Socioeconomic History  . Marital status: Married    Spouse name: Not on file  . Number of children: 3  . Years of education: Not on file  . Highest education level: Not on file  Occupational History  . Occupation: Research scientist (life sciences) Needs  . Financial resource strain: Not on file  . Food insecurity:    Worry: Not on file    Inability: Not on file  . Transportation needs:    Medical: Not on file    Non-medical: Not on file  Tobacco Use  . Smoking status: Former Smoker    Packs/day: 0.50    Years: 18.00    Pack years: 9.00    Types: Cigarettes    Last attempt to quit: 06/28/1999    Years since quitting: 19.0  . Smokeless tobacco: Never Used  Substance and Sexual Activity  . Alcohol use: Yes    Alcohol/week: 7.0 standard drinks    Types: 7 Glasses of wine per week  . Drug use: Never  . Sexual activity: Not on file  Lifestyle  . Physical activity:    Days per week: Not on file    Minutes per session: Not on file  . Stress: Not on file  Relationships  . Social connections:    Talks on phone: Not on file    Gets together: Not on file    Attends religious service: Not on file    Active member of club or organization: Not on file      Attends meetings of clubs or organizations: Not on file    Relationship status: Not on file  Other Topics Concern  . Not on file  Social History Narrative  . Not on file     Family History: The patient's family history includes Colon cancer (age of onset: 75) in her sister; Diabetes in her brother, mother, and sister; Emphysema in her father; Epilepsy in her sister; Healthy in her son, son, and son; Heart  Problems in her son; Hypertension in her brother and mother; Lung cancer in her sister. ROS:   Please see the history of present illness.    All other systems reviewed and are negative.  EKGs/Labs/Other Studies Reviewed:    The following studies were reviewed today:  EKG:  EKG ordered today.  The ekg ordered today demonstrates [place]: 11/14/2017: ALT 12; BUN 12; Creatinine, Ser 0.59; Hemoglobin 13.2; Platelets 434.0; Potassium 4.5; Sodium 140; TSH 2.27  Recent Lipid Panel    Component Value Date/Time   CHOL 239 (H) 10/28/2016 0934   TRIG 120.0 10/28/2016 0934   HDL 66.80 10/28/2016 0934   CHOLHDL 4 10/28/2016 0934   VLDL 24.0 10/28/2016 0934   LDLCALC 148 (H) 10/28/2016 0934   LDLDIRECT 161.6 03/23/2011 1331    Physical Exam:    VS:  BP 116/80 (BP Location: Right Arm, Patient Position: Sitting, Cuff Size: Normal)   Pulse 80   Ht 5\' 1"  (1.549 m)   Wt 168 lb 12.8 oz (76.6 kg)   SpO2 98%   BMI 31.89 kg/m     Wt Readings from Last 3 Encounters:  07/20/18 168 lb 12.8 oz (76.6 kg)  05/30/18 171 lb 3.2 oz (77.7 kg)  05/07/18 175 lb (79.4 kg)     GEN:  Well nourished, well developed in no acute distress HEENT: Normal NECK: No JVD; No carotid bruits LYMPHATICS: No lymphadenopathy CARDIAC: RRR, no murmurs, rubs, gallops RESPIRATORY:  Clear to auscultation without rales, wheezing or rhonchi  ABDOMEN: Soft, non-tender, non-distended MUSCULOSKELETAL:  No edema; No deformity  SKIN: Warm and dry NEUROLOGIC:  Alert and oriented x 3 PSYCHIATRIC:  Normal  affect    Signed, Shirlee More, MD  07/20/2018 1:21 PM    [facility]

## 2018-07-20 ENCOUNTER — Ambulatory Visit (INDEPENDENT_AMBULATORY_CARE_PROVIDER_SITE_OTHER): Payer: BLUE CROSS/BLUE SHIELD | Admitting: Cardiology

## 2018-07-20 ENCOUNTER — Encounter: Payer: Self-pay | Admitting: Cardiology

## 2018-07-20 VITALS — BP 116/80 | HR 80 | Ht 61.0 in | Wt 168.8 lb

## 2018-07-20 DIAGNOSIS — I491 Atrial premature depolarization: Secondary | ICD-10-CM | POA: Diagnosis not present

## 2018-07-20 DIAGNOSIS — R002 Palpitations: Secondary | ICD-10-CM

## 2018-07-20 NOTE — Patient Instructions (Addendum)
Medication Instructions:  Your physician recommends that you continue on your current medications as directed. Please refer to the Current Medication list given to you today.  If you need a refill on your cardiac medications before your next appointment, please call your pharmacy.   Lab work: NONE If you have labs (blood work) drawn today and your tests are completely normal, you will receive your results only by: Marland Kitchen MyChart Message (if you have MyChart) OR . A paper copy in the mail If you have any lab test that is abnormal or we need to change your treatment, we will call you to review the results.  Testing/Procedures: You had an EKG today   Follow-Up: At [facility], you and your health needs are our priority.  As part of our continuing mission to provide you with exceptional heart care, we have created designated Provider Care Teams.  These Care Teams include your primary Cardiologist (physician) and Advanced Practice Providers (APPs -  Physician Assistants and Nurse Practitioners) who all work together to provide you with the care you need, when you need it. You will need a follow up appointment in 6 months.  Please call our office 2 months in advance to schedule this appointment.    1. Avoid all over-the-counter antihistamines except Claritin/Loratadine and Zyrtec/Cetrizine. 2. Avoid all combination including cold sinus allergies flu decongestant and sleep medications 3. You can use Robitussin DM Mucinex and Mucinex DM for cough. 4. can use Tylenol aspirin ibuprofen and naproxen but no combinations such as sleep or sinus.

## 2018-07-25 ENCOUNTER — Other Ambulatory Visit: Payer: Self-pay

## 2018-07-25 ENCOUNTER — Encounter: Payer: Self-pay | Admitting: Family Medicine

## 2018-07-25 ENCOUNTER — Ambulatory Visit (INDEPENDENT_AMBULATORY_CARE_PROVIDER_SITE_OTHER): Payer: BLUE CROSS/BLUE SHIELD | Admitting: Family Medicine

## 2018-07-25 VITALS — BP 121/81 | HR 73 | Temp 98.0°F | Resp 16 | Ht 61.0 in | Wt 167.2 lb

## 2018-07-25 DIAGNOSIS — E559 Vitamin D deficiency, unspecified: Secondary | ICD-10-CM

## 2018-07-25 DIAGNOSIS — Z6831 Body mass index (BMI) 31.0-31.9, adult: Secondary | ICD-10-CM

## 2018-07-25 DIAGNOSIS — D4701 Cutaneous mastocytosis: Secondary | ICD-10-CM

## 2018-07-25 DIAGNOSIS — F331 Major depressive disorder, recurrent, moderate: Secondary | ICD-10-CM

## 2018-07-25 DIAGNOSIS — E6609 Other obesity due to excess calories: Secondary | ICD-10-CM | POA: Diagnosis not present

## 2018-07-25 DIAGNOSIS — Z124 Encounter for screening for malignant neoplasm of cervix: Secondary | ICD-10-CM

## 2018-07-25 DIAGNOSIS — Z0000 Encounter for general adult medical examination without abnormal findings: Secondary | ICD-10-CM

## 2018-07-25 HISTORY — DX: Cutaneous mastocytosis: D47.01

## 2018-07-25 LAB — CBC WITH DIFFERENTIAL/PLATELET
Basophils Absolute: 0 10*3/uL (ref 0.0–0.1)
Basophils Relative: 0.5 % (ref 0.0–3.0)
Eosinophils Absolute: 0.1 10*3/uL (ref 0.0–0.7)
Eosinophils Relative: 2.3 % (ref 0.0–5.0)
HCT: 40.7 % (ref 36.0–46.0)
Hemoglobin: 13.3 g/dL (ref 12.0–15.0)
LYMPHS ABS: 1.4 10*3/uL (ref 0.7–4.0)
Lymphocytes Relative: 21.3 % (ref 12.0–46.0)
MCHC: 32.8 g/dL (ref 30.0–36.0)
MCV: 85.5 fl (ref 78.0–100.0)
MONOS PCT: 10.1 % (ref 3.0–12.0)
Monocytes Absolute: 0.7 10*3/uL (ref 0.1–1.0)
Neutro Abs: 4.4 10*3/uL (ref 1.4–7.7)
Neutrophils Relative %: 65.8 % (ref 43.0–77.0)
Platelets: 475 10*3/uL — ABNORMAL HIGH (ref 150.0–400.0)
RBC: 4.76 Mil/uL (ref 3.87–5.11)
RDW: 15.4 % (ref 11.5–15.5)
WBC: 6.6 10*3/uL (ref 4.0–10.5)

## 2018-07-25 LAB — BASIC METABOLIC PANEL
BUN: 13 mg/dL (ref 6–23)
CALCIUM: 9.6 mg/dL (ref 8.4–10.5)
CO2: 29 mEq/L (ref 19–32)
Chloride: 103 mEq/L (ref 96–112)
Creatinine, Ser: 0.65 mg/dL (ref 0.40–1.20)
GFR: 93.74 mL/min (ref >60.00)
Glucose, Bld: 91 mg/dL (ref 70–99)
Potassium: 4.4 mEq/L (ref 3.5–5.1)
Sodium: 139 mEq/L (ref 135–145)

## 2018-07-25 LAB — HEPATIC FUNCTION PANEL
ALT: 11 U/L (ref 0–35)
AST: 11 U/L (ref 0–37)
Albumin: 4.3 g/dL (ref 3.5–5.2)
Alkaline Phosphatase: 83 U/L (ref 39–117)
BILIRUBIN TOTAL: 0.7 mg/dL (ref 0.2–1.2)
Bilirubin, Direct: 0.1 mg/dL (ref 0.0–0.3)
Total Protein: 6.6 g/dL (ref 6.0–8.3)

## 2018-07-25 LAB — LIPID PANEL
Cholesterol: 217 mg/dL — ABNORMAL HIGH (ref 0–200)
HDL: 51.4 mg/dL (ref >39.00)
LDL Cholesterol: 144 mg/dL — ABNORMAL HIGH (ref 0–99)
NonHDL: 165.67
Total CHOL/HDL Ratio: 4
Triglycerides: 108 mg/dL (ref 0.0–149.0)
VLDL: 21.6 mg/dL (ref 0.0–40.0)

## 2018-07-25 LAB — VITAMIN D 25 HYDROXY (VIT D DEFICIENCY, FRACTURES): VITD: 73.61 ng/mL (ref 30.00–100.00)

## 2018-07-25 LAB — TSH: TSH: 1.72 u[IU]/mL (ref 0.35–4.50)

## 2018-07-25 NOTE — Assessment & Plan Note (Signed)
Ongoing issue for pt.  She is down 4 lbs since last visit.  Applauded her efforts.  Check labs to risk stratify.  Will follow.

## 2018-07-25 NOTE — Assessment & Plan Note (Signed)
New.  Confirmed via bx of unidentified rash.  Asking for referral to [facility] for immunology/allergy.  Referral placed.

## 2018-07-25 NOTE — Assessment & Plan Note (Signed)
Pt's PE WNL w/ exception of obesity.  UTD on mammo and colonoscopy- due for pap.  Pt asking for referral to GYN- referral placed.  Check labs.  Anticipatory guidance provided.

## 2018-07-25 NOTE — Assessment & Plan Note (Signed)
Check labs and replete prn. 

## 2018-07-25 NOTE — Patient Instructions (Addendum)
Follow up in 1 year or as needed We'll notify you of your lab results and make any changes if needed Continue to work on healthy diet and regular exercise- you can do it! We'll call you with your Allergy and GYN appts Call with any questions or concerns Happy New Year!!!

## 2018-07-25 NOTE — Assessment & Plan Note (Signed)
Currently controlled on Cymbalta

## 2018-07-25 NOTE — Progress Notes (Signed)
   Subjective:    Patient ID: Tina Williams, female    DOB: 1961/05/22, 58 y.o.   MRN: [id number]  HPI CPE- UTD on mammo, due for pap (asking for referral to GYN).  UTD on immunizations, colonoscopy.   Review of Systems Patient reports no vision/ hearing changes, adenopathy,fever, weight change,  persistant/recurrent hoarseness , swallowing issues, chest pain, palpitations, edema, persistant/recurrent cough, hemoptysis, dyspnea (rest/exertional/paroxysmal nocturnal), gastrointestinal bleeding (melena, rectal bleeding), abdominal pain, significant heartburn, bowel changes, GU symptoms (dysuria, hematuria, incontinence), Gyn symptoms (abnormal  bleeding, pain),  syncope, focal weakness, memory loss, numbness & tingling, skin/hair/nail changes, abnormal bruising or bleeding, anxiety, or depression.     Objective:   Physical Exam General Appearance:    Alert, cooperative, no distress, appears stated age  Head:    Normocephalic, without obvious abnormality, atraumatic  Eyes:    PERRL, conjunctiva/corneas clear, EOM's intact, fundi    benign, both eyes  Ears:    Normal TM's and external ear canals, both ears  Nose:   Nares normal, septum midline, mucosa normal, no drainage    or sinus tenderness  Throat:   Lips, mucosa, and tongue normal; teeth and gums normal  Neck:   Supple, symmetrical, trachea midline, no adenopathy;    Thyroid: no enlargement/tenderness/nodules  Back:     Symmetric, no curvature, ROM normal, no CVA tenderness  Lungs:     Clear to auscultation bilaterally, respirations unlabored  Chest Wall:    No tenderness or deformity   Heart:    Regular rate and rhythm, S1 and S2 normal, no murmur, rub   or gallop  Breast Exam:    Deferred to mammo  Abdomen:     Soft, non-tender, bowel sounds active all four quadrants,    no masses, no organomegaly  Genitalia:    Deferred to GYN  Rectal:    Extremities:   Extremities normal, atraumatic, no cyanosis or edema  Pulses:   2+ and  symmetric all extremities  Skin:   Skin color, texture, turgor normal, no rashes or lesions  Lymph nodes:   Cervical, supraclavicular, and axillary nodes normal  Neurologic:   CNII-XII intact, normal strength, sensation and reflexes    throughout          Assessment & Plan:

## 2018-07-28 DIAGNOSIS — M810 Age-related osteoporosis without current pathological fracture: Secondary | ICD-10-CM

## 2018-07-28 HISTORY — DX: Age-related osteoporosis without current pathological fracture: M81.0

## 2018-08-01 ENCOUNTER — Ambulatory Visit (INDEPENDENT_AMBULATORY_CARE_PROVIDER_SITE_OTHER): Payer: BLUE CROSS/BLUE SHIELD | Admitting: Gynecology

## 2018-08-01 ENCOUNTER — Encounter: Payer: Self-pay | Admitting: Gynecology

## 2018-08-01 VITALS — BP 118/76 | Ht 60.0 in | Wt 168.0 lb

## 2018-08-01 DIAGNOSIS — E28319 Asymptomatic premature menopause: Secondary | ICD-10-CM | POA: Diagnosis not present

## 2018-08-01 DIAGNOSIS — Z01419 Encounter for gynecological examination (general) (routine) without abnormal findings: Secondary | ICD-10-CM

## 2018-08-01 DIAGNOSIS — Z1151 Encounter for screening for human papillomavirus (HPV): Secondary | ICD-10-CM | POA: Diagnosis not present

## 2018-08-01 NOTE — Patient Instructions (Signed)
Follow-up for the bone density as scheduled. 

## 2018-08-01 NOTE — Progress Notes (Signed)
    Navi Ewton Feb 22, 1961 [id number]        58 y.o.  [id number] new patient for annual gynecologic exam.  Without gynecologic complaints.  Underwent premature menopause in her late 58s.  Reports having undergone blood work to document and also has a family history of premature menopause.  Doing well now without significant menopausal symptoms.  Past medical history,surgical history, problem list, medications, allergies, family history and social history were all reviewed and documented as reviewed in the EPIC chart.  ROS:  Performed with pertinent positives and negatives included in the history, assessment and plan.   Additional significant findings : None   Exam: Caryn Bee assistant Vitals:   08/01/18 1031  BP: 118/76  Weight: 168 lb (76.2 kg)  Height: 5' (1.524 m)   Body mass index is 32.81 kg/m.  General appearance:  Normal affect, orientation and appearance. Skin: Grossly normal HEENT: Without gross lesions.  No cervical or supraclavicular adenopathy. Thyroid normal.  Lungs:  Clear without wheezing, rales or rhonchi Cardiac: RR, without RMG Abdominal:  Soft, nontender, without masses, guarding, rebound, organomegaly or hernia Breasts:  Examined lying and sitting without masses, retractions, discharge or axillary adenopathy. Pelvic:  Ext, BUS, Vagina: Normal with atrophic changes  Cervix: Normal with atrophic changes.  Pap smear/HPV  Uterus: Anteverted, normal size, shape and contour, midline and mobile nontender   Adnexa: Without masses or tenderness    Anus and perineum: Normal   Rectovaginal: Normal sphincter tone without palpated masses or tenderness.    Assessment/Plan:  58 y.o. G18P0013 female for annual gynecologic exam.   1. Postmenopausal.  Without significant menopausal symptoms or any vaginal bleeding.  Underwent early menopause.  Transiently tried HRT but could not tolerate it. 2. Pap smear 4 to 5 years ago.  Pap smear/HPV done today.  No history of abnormal  Pap smears previously. 3. Mammography 04/2018.  Continue with annual mammography when due.  Breast exam normal today. 4. DEXA reported a number of years ago.  Recommend follow-up DEXA now given her history of premature menopause and no HRT.  Patient will schedule in follow-up for this. 5. Colonoscopy 2017.  Repeat at their recommended interval. 6. Health maintenance.  No routine lab work done as patient has this done elsewhere.  Follow-up for bone density otherwise follow-up in 1 year for annual exam, sooner as needed.   Anastasio Auerbach MD, 10:50 AM 08/01/2018

## 2018-08-01 NOTE — Addendum Note (Signed)
Addended by: Nelva Nay on: 08/01/2018 11:19 AM   Modules accepted: Orders

## 2018-08-02 LAB — PAP IG AND HPV HIGH-RISK: HPV DNA High Risk: NOT DETECTED

## 2018-08-03 ENCOUNTER — Ambulatory Visit (INDEPENDENT_AMBULATORY_CARE_PROVIDER_SITE_OTHER): Payer: BLUE CROSS/BLUE SHIELD | Admitting: *Deleted

## 2018-08-03 DIAGNOSIS — J309 Allergic rhinitis, unspecified: Secondary | ICD-10-CM

## 2018-08-07 ENCOUNTER — Encounter: Payer: Self-pay | Admitting: *Deleted

## 2018-08-07 DIAGNOSIS — J301 Allergic rhinitis due to pollen: Secondary | ICD-10-CM | POA: Diagnosis not present

## 2018-08-07 NOTE — Progress Notes (Signed)
Vials exp. 08/08/2019

## 2018-08-09 ENCOUNTER — Encounter: Payer: Self-pay | Admitting: Allergy and Immunology

## 2018-08-09 ENCOUNTER — Ambulatory Visit (INDEPENDENT_AMBULATORY_CARE_PROVIDER_SITE_OTHER): Payer: BLUE CROSS/BLUE SHIELD | Admitting: *Deleted

## 2018-08-09 DIAGNOSIS — J309 Allergic rhinitis, unspecified: Secondary | ICD-10-CM | POA: Diagnosis not present

## 2018-08-17 DIAGNOSIS — J383 Other diseases of vocal cords: Secondary | ICD-10-CM | POA: Diagnosis not present

## 2018-08-17 DIAGNOSIS — J309 Allergic rhinitis, unspecified: Secondary | ICD-10-CM | POA: Diagnosis not present

## 2018-08-17 DIAGNOSIS — Z91018 Allergy to other foods: Secondary | ICD-10-CM | POA: Diagnosis not present

## 2018-08-17 DIAGNOSIS — J45909 Unspecified asthma, uncomplicated: Secondary | ICD-10-CM | POA: Diagnosis not present

## 2018-08-17 DIAGNOSIS — Z91048 Other nonmedicinal substance allergy status: Secondary | ICD-10-CM | POA: Diagnosis not present

## 2018-08-17 DIAGNOSIS — Z87891 Personal history of nicotine dependence: Secondary | ICD-10-CM | POA: Diagnosis not present

## 2018-08-17 DIAGNOSIS — R21 Rash and other nonspecific skin eruption: Secondary | ICD-10-CM | POA: Diagnosis not present

## 2018-08-19 DIAGNOSIS — J019 Acute sinusitis, unspecified: Secondary | ICD-10-CM | POA: Diagnosis not present

## 2018-08-22 ENCOUNTER — Ambulatory Visit (INDEPENDENT_AMBULATORY_CARE_PROVIDER_SITE_OTHER): Payer: BLUE CROSS/BLUE SHIELD

## 2018-08-22 ENCOUNTER — Other Ambulatory Visit: Payer: Self-pay | Admitting: Gynecology

## 2018-08-22 DIAGNOSIS — E28319 Asymptomatic premature menopause: Secondary | ICD-10-CM | POA: Diagnosis not present

## 2018-08-22 DIAGNOSIS — M810 Age-related osteoporosis without current pathological fracture: Secondary | ICD-10-CM

## 2018-08-23 ENCOUNTER — Ambulatory Visit (INDEPENDENT_AMBULATORY_CARE_PROVIDER_SITE_OTHER): Payer: BLUE CROSS/BLUE SHIELD | Admitting: *Deleted

## 2018-08-23 ENCOUNTER — Telehealth: Payer: Self-pay | Admitting: Gynecology

## 2018-08-23 ENCOUNTER — Encounter: Payer: Self-pay | Admitting: Gynecology

## 2018-08-23 ENCOUNTER — Encounter: Payer: Self-pay | Admitting: *Deleted

## 2018-08-23 DIAGNOSIS — J309 Allergic rhinitis, unspecified: Secondary | ICD-10-CM | POA: Diagnosis not present

## 2018-08-23 NOTE — Telephone Encounter (Signed)
Sent mychart message

## 2018-08-23 NOTE — Telephone Encounter (Signed)
Tell patient her bone density shows osteoporosis.  Recommend office visit to discuss treatment options.

## 2018-08-27 ENCOUNTER — Encounter: Payer: Self-pay | Admitting: Gynecology

## 2018-08-27 ENCOUNTER — Ambulatory Visit: Payer: BLUE CROSS/BLUE SHIELD | Admitting: Gynecology

## 2018-08-27 VITALS — BP 124/78

## 2018-08-27 DIAGNOSIS — M810 Age-related osteoporosis without current pathological fracture: Secondary | ICD-10-CM

## 2018-08-27 MED ORDER — ALENDRONATE SODIUM 70 MG PO TABS: 70.0000 mg | ORAL_TABLET | ORAL | 11 refills | Status: DC

## 2018-08-27 NOTE — Patient Instructions (Signed)
Alendronate tablets  What is this medicine?  ALENDRONATE (a LEN droe nate) slows calcium loss from bones. It helps to make normal healthy bone and to slow bone loss in people with Paget's disease and osteoporosis. It may be used in others at risk for bone loss.  This medicine may be used for other purposes; ask your health care provider or pharmacist if you have questions.  COMMON BRAND NAME(S): Fosamax  What should I tell my health care provider before I take this medicine?  They need to know if you have any of these conditions:  -dental disease  -esophagus, stomach, or intestine problems, like acid reflux or GERD  -kidney disease  -low blood calcium  -low vitamin D  -problems sitting or standing 30 minutes  -trouble swallowing  -an unusual or allergic reaction to alendronate, other medicines, foods, dyes, or preservatives  -pregnant or trying to get pregnant  -breast-feeding      How should I use this medicine?  You must take this medicine exactly as directed or you will lower the amount of the medicine you absorb into your body or you may cause yourself harm. Take this medicine by mouth first thing in the morning, after you are up for the day. Do not eat or drink anything before you take your medicine. Swallow the tablet with a full glass (6 to 8 fluid ounces) of plain water. Do not take this medicine with any other drink. Do not chew or crush the tablet. After taking this medicine, do not eat breakfast, drink, or take any medicines or vitamins for at least 30 minutes. Sit or stand up for at least 30 minutes after you take this medicine; do not lie down. Do not take your medicine more often than directed.  Talk to your pediatrician regarding the use of this medicine in children. Special care may be needed.  Overdosage: If you think you have taken too much of this medicine contact a poison control center or emergency room at once.        NOTE: This medicine is only for you. Do not share this medicine with  others.  What if I miss a dose?  If you miss a dose, do not take it later in the day. Continue your normal schedule starting the next morning. Do not take double or extra doses.  What may interact with this medicine?  -aluminum hydroxide  -antacids  -aspirin  -calcium supplements  -drugs for inflammation like ibuprofen, naproxen, and others  -iron supplements  -magnesium supplements  -vitamins with minerals  This list may not describe all possible interactions. Give your health care provider a list of all the medicines, herbs, non-prescription drugs, or dietary supplements you use. Also tell them if you smoke, drink alcohol, or use illegal drugs. Some items may interact with your medicine.  What should I watch for while using this medicine?  Visit your doctor or health care professional for regular checks ups. It may be some time before you see benefit from this medicine. Do not stop taking your medicine except on your doctor's advice. Your doctor or health care professional may order blood tests and other tests to see how you are doing.  You should make sure you get enough calcium and vitamin D while you are taking this medicine, unless your doctor tells you not to. Discuss the foods you eat and the vitamins you take with your health care professional.  Some people who take this medicine have severe bone, joint,   and/or muscle pain. This medicine may also increase your risk for a broken thigh bone. Tell your doctor right away if you have pain in your upper leg or groin. Tell your doctor if you have any pain that does not go away or that gets worse.  This medicine can make you more sensitive to the sun. If you get a rash while taking this medicine, sunlight may cause the rash to get worse. Keep out of the sun. If you cannot avoid being in the sun, wear protective clothing and use sunscreen. Do not use sun lamps or tanning beds/booths.  What side effects may I notice from receiving this medicine?  Side effects that  you should report to your doctor or health care professional as soon as possible:  -allergic reactions like skin rash, itching or hives, swelling of the face, lips, or tongue  -black or tarry stools  -bone, muscle or joint pain  -changes in vision  -chest pain  -heartburn or stomach pain  -jaw pain, especially after dental work  -pain or trouble when swallowing  -redness, blistering, peeling or loosening of the skin, including inside the mouth  Side effects that usually do not require medical attention (report to your doctor or health care professional if they continue or are bothersome):  -changes in taste  -diarrhea or constipation  -eye pain or itching  -headache  -nausea or vomiting  -stomach gas or fullness  This list may not describe all possible side effects. Call your doctor for medical advice about side effects. You may report side effects to FDA at 1-800-FDA-1088.  Where should I keep my medicine?  Keep out of the reach of children.  Store at room temperature of 15 and 30 degrees C (59 and 86 degrees F). Throw away any unused medicine after the expiration date.  NOTE: This sheet is a summary. It may not cover all possible information. If you have questions about this medicine, talk to your doctor, pharmacist, or health care provider.   2019 Elsevier/Gold Standard (2010-12-10 08:56:09)

## 2018-08-27 NOTE — Progress Notes (Signed)
    Mycah Mcdougall 23-Apr-1961 [id number]        58 y.o.  G4P0013 presents to discuss her bone density.  T score -2.9 AP spine.  History of early menopause in the late 6s with no HRT.  Also has family history of osteoporosis in her sisters to include a younger sister.  Past medical history,surgical history, problem list, medications, allergies, family history and social history were all reviewed and documented in the EPIC chart.  Directed ROS with pertinent positives and negatives documented in the history of present illness/assessment and plan.  Exam: Vitals:   08/27/18 1200  BP: 124/78   General appearance:  Normal   Assessment/Plan:  58 y.o. [id number] with osteoporosis attributable to early menopause.  Recent vitamin D level, TSH and calcium levels are normal.  Strong family history of osteoporosis.  We discussed osteoporosis in detail and her risks to include increased risk for fracture now and increasing with aging due to ongoing bone loss as well as the aging process.  We reviewed her bone density study in detail.  I discussed treatment options to include calcium, vitamin D, weightbearing exercise, bisphosphate's, Prolia, Evista, Evenity, Forteo.  The risks/benefits of each choice discussed.  We reviewed GERD, osteonecrosis of the jaw and atypical fractures with bisphosphate's.  After lengthy discussion she wants to try alendronate 70 mg weekly.  How to take the medication and what side effects to look out for were discussed.  1 year prescription provided.  Will call if she has any issues once starting.  We will plan to repeat the bone density in 2 years.  Possible drug-free holiday after 5 to 6 years also reviewed.    Anastasio Auerbach MD, 12:38 PM 08/27/2018

## 2018-09-03 ENCOUNTER — Ambulatory Visit (INDEPENDENT_AMBULATORY_CARE_PROVIDER_SITE_OTHER): Payer: Self-pay

## 2018-09-03 ENCOUNTER — Ambulatory Visit (INDEPENDENT_AMBULATORY_CARE_PROVIDER_SITE_OTHER): Payer: BLUE CROSS/BLUE SHIELD | Admitting: Orthopedic Surgery

## 2018-09-03 ENCOUNTER — Encounter (INDEPENDENT_AMBULATORY_CARE_PROVIDER_SITE_OTHER): Payer: Self-pay | Admitting: Orthopedic Surgery

## 2018-09-03 DIAGNOSIS — M6702 Short Achilles tendon (acquired), left ankle: Secondary | ICD-10-CM | POA: Diagnosis not present

## 2018-09-03 DIAGNOSIS — M25572 Pain in left ankle and joints of left foot: Secondary | ICD-10-CM

## 2018-09-03 NOTE — Progress Notes (Signed)
Office Visit Note   Patient: Tina Williams           Date of Birth: 02-10-1961           MRN: [id number] Visit Date: 09/03/2018              Requested by: Midge Minium, MD [address] [place], [place] [address] PCP: Midge Minium, MD  Chief Complaint  Patient presents with  . Left Foot - Pain      HPI: Patient is a 58 year old woman who was seen for initial evaluation for left foot pain.  She has had a MRI scan which showed stress reaction in the metatarsals 3 and 4 patient states she now has pain beneath the second metatarsal head.  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  Assessment & Plan: Visit Diagnoses:  1. Pain in left ankle and joints of left foot   2. Achilles tendon contracture, left     Plan: Patient was given instruction and demonstrated Achilles stretching to do 5 times a day a minute at a time this was written down for her.  Patient was given instructions for a stiff soled sneaker such as [place] and arch supports such as sole.  Follow-Up Instructions: Return in about 4 weeks (around 10/01/2018).   Ortho Exam  Patient is alert, oriented, no adenopathy, well-dressed, normal affect, normal respiratory effort. Examination patient is a good dorsalis pedis pulse she has significant heel cord contracture on the left with dorsiflexion 20 degrees short of neutral with her knee extended.  Patient has point tenderness to palpation worse beneath the second metatarsal head but also beneath the third and fourth metatarsal heads.  There is no ecchymosis no bruising no signs of any acute fractures.  Patient states the pain is worse at the end of the day.  Imaging: Xr Foot Complete Left  Result Date: 09/03/2018 3 view radiographs of the left foot shows no fractures of the metatarsals there is no callus formation   No images are attached to the encounter.  Labs: Lab Results  Component Value Date   ESRSEDRATE 11  12/15/2017     Lab Results  Component Value Date   ALBUMIN 4.3 07/25/2018   ALBUMIN 4.4 11/14/2017   ALBUMIN 4.4 10/28/2016    There is no height or weight on file to calculate BMI.  Orders:  Orders Placed This Encounter  Procedures  . XR Foot Complete Left   No orders of the defined types were placed in this encounter.    Procedures: No procedures performed  Clinical Data: No additional findings.  ROS:  All other systems negative, except as noted in the HPI. Review of Systems  Objective: Vital Signs: There were no vitals taken for this visit.  Specialty Comments:  No specialty comments available.  PMFS History: Patient Active Problem List   Diagnosis Date Noted  . Telangiectasia macularis eruptiva perstans 07/25/2018  . Pain in left ankle and joints of left foot 05/07/2018  . Palpitation 11/28/2017  . ANA positive 11/28/2017  . Mild persistent asthma without complication 76/16/0737  . Vertigo 09/27/2017  . Allergic rhinoconjunctivitis 09/27/2017  . Tinnitus of right ear 09/27/2017  . Vitamin D deficiency 10/28/2016  . Hx of adenomatous polyp of colon 01/11/2016  . H/O oralPollinosis 03/04/2015  . Oral allergy syndrome, subsequent encounter 03/04/2015  . Abdominal pain, epigastric 05/13/2014  . Moderate recurrent major depression ([place]) 01/06/2014  .  Polyarthralgia 10/25/2012  . Hyperlipidemia 03/23/2011  . General medical examination 03/23/2011  . Personal history of colonic adenoma  11/26/2009  . MYALGIA 11/24/2009  . FOOT PAIN, BILATERAL 10/28/2009  . RHINITIS 10/09/2008  . ASTHMA 10/09/2008  . OBESITY 07/09/2008  . MENOPAUSE, EARLY 07/09/2008   Past Medical History:  Diagnosis Date  . Abdominal pain, epigastric 05/13/2014  . Allergic rhinoconjunctivitis 09/27/2017  . Allergy   . Asthma   . ASTHMA 10/09/2008   HFA 75% p coaching  09/27/10    . Depression   . Early menopause   . Fatigue 11/27/2017  . FOOT PAIN, BILATERAL 10/28/2009   Qualifier:  Diagnosis of  By: Birdie Riddle MD, Belenda Cruise    . General medical examination 03/23/2011  . H/O oralPollinosis 03/04/2015  . Hx of adenomatous polyp of colon 01/11/2016  . Hyperlipidemia   . MENOPAUSE, EARLY 07/09/2008   Qualifier: Diagnosis of  By: Ronnald Ramp CMA, Chemira    . Mild persistent asthma without complication 0/0/5110  . Moderate recurrent major depression ([place]) 01/06/2014  . MYALGIA 11/24/2009   Qualifier: Diagnosis of  By: Birdie Riddle MD, Belenda Cruise    . Non-allergic rhinitis   . Obesity   . OBESITY 07/09/2008   Qualifier: Diagnosis of  By: Birdie Riddle MD, Belenda Cruise    . Oral allergy syndrome, subsequent encounter 03/04/2015  . Osteoarthritis   . Osteoporosis 07/2018   T score -2.9  . Personal history of colonic adenoma  11/26/2009  . Polyarthralgia 10/25/2012  . Respiratory tract infection 03/04/2015  . RHINITIS 10/09/2008   Qualifier: Diagnosis of  By: Birdie Riddle MD, Belenda Cruise    . Sinusitis acute   . Tinnitus of right ear 09/27/2017  . URI (upper respiratory infection)   . Vertigo 09/27/2017  . Vitamin D deficiency 10/28/2016    Family History  Problem Relation Age of Onset  . Epilepsy Sister   . Colon cancer Sister 10  . Hypertension Mother   . Diabetes Mother   . Diabetes Brother   . Hypertension Brother   . Lung cancer Sister        was a smoker  . Diabetes Sister   . Emphysema Father        was a smoker  . Healthy Son   . Healthy Son   . Heart Problems Son        age 59  . Healthy Son     Past Surgical History:  Procedure Laterality Date  . CESAREAN SECTION  1988  . KNEE ARTHROPLASTY    . KNEE SURGERY Right 1998  . [place] EXTRACTION  1984   Social History   Occupational History  . Occupation: Teacher's Aid  Tobacco Use  . Smoking status: Former Smoker    Packs/day: 0.50    Years: 18.00    Pack years: 9.00    Types: Cigarettes    Last attempt to quit: 06/28/1999    Years since quitting: 19.1  . Smokeless tobacco: Never Used  Substance and Sexual Activity  . Alcohol use:  Yes    Alcohol/week: 7.0 standard drinks    Types: 7 Glasses of wine per week  . Drug use: Never  . Sexual activity: Yes    Comment: 1st intercourse 58 yo-Fewer than 5 partners

## 2018-09-04 ENCOUNTER — Other Ambulatory Visit: Payer: Self-pay | Admitting: Cardiology

## 2018-09-06 ENCOUNTER — Ambulatory Visit (INDEPENDENT_AMBULATORY_CARE_PROVIDER_SITE_OTHER): Payer: BLUE CROSS/BLUE SHIELD | Admitting: Orthopedic Surgery

## 2018-09-17 ENCOUNTER — Ambulatory Visit (INDEPENDENT_AMBULATORY_CARE_PROVIDER_SITE_OTHER): Payer: BLUE CROSS/BLUE SHIELD

## 2018-09-17 DIAGNOSIS — J309 Allergic rhinitis, unspecified: Secondary | ICD-10-CM | POA: Diagnosis not present

## 2018-09-27 ENCOUNTER — Encounter: Payer: Self-pay | Admitting: Allergy and Immunology

## 2018-09-27 ENCOUNTER — Other Ambulatory Visit: Payer: Self-pay

## 2018-09-27 ENCOUNTER — Ambulatory Visit: Payer: BLUE CROSS/BLUE SHIELD | Admitting: Allergy and Immunology

## 2018-09-27 ENCOUNTER — Telehealth (INDEPENDENT_AMBULATORY_CARE_PROVIDER_SITE_OTHER): Payer: Self-pay | Admitting: Radiology

## 2018-09-27 VITALS — BP 122/72 | HR 72 | Resp 16

## 2018-09-27 DIAGNOSIS — J4530 Mild persistent asthma, uncomplicated: Secondary | ICD-10-CM

## 2018-09-27 DIAGNOSIS — T781XXD Other adverse food reactions, not elsewhere classified, subsequent encounter: Secondary | ICD-10-CM

## 2018-09-27 DIAGNOSIS — J3089 Other allergic rhinitis: Secondary | ICD-10-CM

## 2018-09-27 DIAGNOSIS — J309 Allergic rhinitis, unspecified: Secondary | ICD-10-CM | POA: Diagnosis not present

## 2018-09-27 NOTE — Telephone Encounter (Signed)
Called and left voicemail asking patient to call us back to answer pre screening questions for appointment on 4/6 

## 2018-09-27 NOTE — Progress Notes (Signed)
- [facility]   Follow-up Note  Referring Provider: Midge Minium, MD Primary Provider: Midge Minium, MD Date of Office Visit: 09/27/2018  Subjective:   Tina Williams (DOB: 01-12-1961) is a 58 y.o. female who returns to the [facility] on 09/27/2018 in re-evaluation of the following:  HPI: Tina Williams returns to this clinic in reevaluation of her asthma and allergic rhinitis and oral pollinosis syndrome addressed during her last evaluation of 29 March 2018.  She has really done well with her airway and it does not sound as though she has required a systemic steroid or an antibiotic to treat any type of airway issue and rarely uses a short acting bronchodilator while she continues to use low-dose Flovent and Flonase and montelukast on a consistent basis.  Her oral pollinosis syndrome has been dramatically improved with her current immunotherapy.  She can now eat most fruit without any problem.  She has not eaten a melon yet.  She is undergoing further evaluation regarding the dermatitis involving her right arm.  When she last presented to this clinic she was told that she may have telangiectasia macularis eruptive perstans and I obtained a serum tryptase with that last visit which was normal.  She did obtain the flu vaccine this year.  Allergies as of 09/27/2018      Reactions   Latex Other (See Comments)   Throat swelling, chest tightness   Amoxicillin Hives   Clarithromycin    REACTION: hives   Naproxen Sodium    REACTION: HIVES   Penicillins    REACTION: hives   Sulfa Antibiotics Hives      Medication List      alendronate 70 MG tablet Commonly known as:  FOSAMAX Take 1 tablet (70 mg total) by mouth every 7 (seven) days. Take with a full glass of water on an empty stomach.   Azelastine HCl 0.15 % Soln   cimetidine 200 MG tablet Commonly known as:  TAGAMET Take 1,200 mg by mouth daily.   diclofenac  sodium 1 % Gel Commonly known as:  VOLTAREN Apply 3 gm to 3 large joints up to 3 times a day.Dispense 3 tubes with 3 refills.   DULoxetine 60 MG capsule Commonly known as:  CYMBALTA TAKE 1 CAPSULE BY MOUTH EVERY DAY. Please call [phone] to schedule a Physical.   EPINEPHrine 0.3 mg/0.3 mL Soaj injection Commonly known as:  EpiPen 2-Pak Use as directed for life threatening allergic reactions   FIBER PO Take 2 tablets by mouth daily as needed.   flecainide 50 MG tablet Commonly known as:  TAMBOCOR TAKE 1 TABLET BY MOUTH TWICE A DAY   fluticasone 44 MCG/ACT inhaler Commonly known as:  Flovent HFA Inhale two puffs twice daily to prevent cough or wheeze. Rinse mouth after use.   fluticasone 50 MCG/ACT nasal spray Commonly known as:  FLONASE   IMMUNE FORMULA PO Take 1 tablet by mouth daily.   montelukast 10 MG tablet Commonly known as:  SINGULAIR TAKE ONE TABLET ONCE DAILY AS DIRECTED   ProAir HFA 108 (90 Base) MCG/ACT inhaler Generic drug:  albuterol Inhale into the lungs every 6 (six) hours as needed for wheezing or shortness of breath.   Vitamin B-12 1000 MCG Subl Place 1 tablet under the tongue daily.   Vitamin D-3 125 MCG (5000 UT) Tabs Take 1 tablet by mouth daily.   ZyrTEC Allergy 10 MG tablet Generic drug:  cetirizine Take 10  mg by mouth daily.       Past Medical History:  Diagnosis Date  . Abdominal pain, epigastric 05/13/2014  . Allergic rhinoconjunctivitis 09/27/2017  . Allergy   . Asthma   . ASTHMA 10/09/2008   HFA 75% p coaching  09/27/10    . Depression   . Early menopause   . Fatigue 11/27/2017  . FOOT PAIN, BILATERAL 10/28/2009   Qualifier: Diagnosis of  By: Birdie Riddle MD, Belenda Cruise    . General medical examination 03/23/2011  . H/O oralPollinosis 03/04/2015  . Hx of adenomatous polyp of colon 01/11/2016  . Hyperlipidemia   . MENOPAUSE, EARLY 07/09/2008   Qualifier: Diagnosis of  By: Ronnald Ramp CMA, Chemira    . Mild persistent asthma without complication  08/03/621  . Moderate recurrent major depression ([place]) 01/06/2014  . MYALGIA 11/24/2009   Qualifier: Diagnosis of  By: Birdie Riddle MD, Belenda Cruise    . Non-allergic rhinitis   . Obesity   . OBESITY 07/09/2008   Qualifier: Diagnosis of  By: Birdie Riddle MD, Belenda Cruise    . Oral allergy syndrome, subsequent encounter 03/04/2015  . Osteoarthritis   . Osteoporosis 07/2018   T score -2.9  . Personal history of colonic adenoma  11/26/2009  . Polyarthralgia 10/25/2012  . Respiratory tract infection 03/04/2015  . RHINITIS 10/09/2008   Qualifier: Diagnosis of  By: Birdie Riddle MD, Belenda Cruise    . Sinusitis acute   . Tinnitus of right ear 09/27/2017  . URI (upper respiratory infection)   . Vertigo 09/27/2017  . Vitamin D deficiency 10/28/2016    Past Surgical History:  Procedure Laterality Date  . CESAREAN SECTION  1988  . KNEE ARTHROPLASTY    . KNEE SURGERY Right 1998  . WISDOM TOOTH EXTRACTION  1984    Review of systems negative except as noted in HPI / PMHx or noted below:  Review of Systems  Constitutional: Negative.   HENT: Negative.   Eyes: Negative.   Respiratory: Negative.   Cardiovascular: Negative.   Gastrointestinal: Negative.   Genitourinary: Negative.   Musculoskeletal: Negative.   Skin: Negative.   Neurological: Negative.   Endo/Heme/Allergies: Negative.   Psychiatric/Behavioral: Negative.      Objective:   Vitals:   09/27/18 1055  BP: 122/72  Pulse: 72  Resp: 16  SpO2: 97%          Physical Exam Constitutional:      Appearance: She is not diaphoretic.  HENT:     Head: Normocephalic.     Right Ear: Tympanic membrane, ear canal and external ear normal.     Left Ear: Tympanic membrane, ear canal and external ear normal.     Nose: Nose normal. No mucosal edema or rhinorrhea.     Mouth/Throat:     Pharynx: Uvula midline. No oropharyngeal exudate.  Eyes:     Conjunctiva/sclera: Conjunctivae normal.  Neck:     Thyroid: No thyromegaly.     Trachea: Trachea normal. No tracheal  tenderness or tracheal deviation.  Cardiovascular:     Rate and Rhythm: Normal rate and regular rhythm.     Heart sounds: Normal heart sounds, S1 normal and S2 normal. No murmur.  Pulmonary:     Effort: No respiratory distress.     Breath sounds: Normal breath sounds. No stridor. No wheezing or rales.  Lymphadenopathy:     Head:     Right side of head: No tonsillar adenopathy.     Left side of head: No tonsillar adenopathy.     Cervical: No cervical  adenopathy.  Skin:    Findings: No erythema or rash.     Nails: There is no clubbing.   Neurological:     Mental Status: She is alert.     Diagnostics:    Spirometry was performed and demonstrated an FEV1 of 2.07 at 88 % of predicted.  Assessment and Plan:   1. Asthma, well controlled, mild persistent   2. Perennial allergic rhinitis   3. Oral allergy syndrome, subsequent encounter     1. Continue Immunotherapy and Epi-Pen  2. Continue Flonase + Azelastine 1-2 sprays each nostril 1-2 times per day   3. Continue Flovent 44 - two inhalations 1-2 times per day depending on asthma activity.   4. Continue Montelukast 10mg  one tablet one time per day  5. Continue zyrtec, nasal saline if needed  6. Continue Proair HFA if needed  7. Return in 6 months or earlier if problem  Claryssa appears to be doing quite well on her current therapy which includes anti-inflammatory medications for both her upper and lower airway and a course of immunotherapy.  She will continue to utilize this plan and I will see her back in this clinic in 6 months or earlier if there is a problem.  Allena Katz, MD Allergy / Immunology [place]

## 2018-09-27 NOTE — Patient Instructions (Addendum)
  1. Continue Immunotherapy and Epi-Pen  2. Continue Flonase + Azelastine 1-2 sprays each nostril 1-2 times per day   3. Continue Flovent 44 - two inhalations 1-2 times per day depending on asthma activity.   4. Continue Montelukast 10mg  one tablet one time per day  5. Continue zyrtec, nasal saline if needed  6. Continue Proair HFA if needed  7. Return in 6 months or earlier if problem

## 2018-10-01 ENCOUNTER — Ambulatory Visit (INDEPENDENT_AMBULATORY_CARE_PROVIDER_SITE_OTHER): Payer: BLUE CROSS/BLUE SHIELD | Admitting: Orthopedic Surgery

## 2018-10-01 ENCOUNTER — Encounter: Payer: Self-pay | Admitting: Allergy and Immunology

## 2018-10-04 ENCOUNTER — Other Ambulatory Visit: Payer: Self-pay | Admitting: Allergy and Immunology

## 2018-10-25 ENCOUNTER — Ambulatory Visit (INDEPENDENT_AMBULATORY_CARE_PROVIDER_SITE_OTHER): Payer: BLUE CROSS/BLUE SHIELD | Admitting: *Deleted

## 2018-10-25 DIAGNOSIS — J309 Allergic rhinitis, unspecified: Secondary | ICD-10-CM

## 2018-11-01 ENCOUNTER — Ambulatory Visit: Payer: Self-pay | Admitting: Orthopedic Surgery

## 2018-11-02 ENCOUNTER — Ambulatory Visit (INDEPENDENT_AMBULATORY_CARE_PROVIDER_SITE_OTHER): Payer: BLUE CROSS/BLUE SHIELD | Admitting: *Deleted

## 2018-11-02 DIAGNOSIS — J309 Allergic rhinitis, unspecified: Secondary | ICD-10-CM | POA: Diagnosis not present

## 2018-11-15 ENCOUNTER — Ambulatory Visit (INDEPENDENT_AMBULATORY_CARE_PROVIDER_SITE_OTHER): Payer: BLUE CROSS/BLUE SHIELD | Admitting: *Deleted

## 2018-11-15 DIAGNOSIS — J309 Allergic rhinitis, unspecified: Secondary | ICD-10-CM | POA: Diagnosis not present

## 2018-11-23 ENCOUNTER — Ambulatory Visit (INDEPENDENT_AMBULATORY_CARE_PROVIDER_SITE_OTHER): Payer: BLUE CROSS/BLUE SHIELD | Admitting: *Deleted

## 2018-11-23 DIAGNOSIS — J309 Allergic rhinitis, unspecified: Secondary | ICD-10-CM | POA: Diagnosis not present

## 2018-11-27 ENCOUNTER — Other Ambulatory Visit: Payer: Self-pay | Admitting: Cardiology

## 2018-12-06 ENCOUNTER — Ambulatory Visit (INDEPENDENT_AMBULATORY_CARE_PROVIDER_SITE_OTHER): Payer: BLUE CROSS/BLUE SHIELD | Admitting: *Deleted

## 2018-12-06 DIAGNOSIS — J309 Allergic rhinitis, unspecified: Secondary | ICD-10-CM

## 2018-12-20 ENCOUNTER — Ambulatory Visit (INDEPENDENT_AMBULATORY_CARE_PROVIDER_SITE_OTHER): Payer: BLUE CROSS/BLUE SHIELD

## 2018-12-20 DIAGNOSIS — J309 Allergic rhinitis, unspecified: Secondary | ICD-10-CM

## 2018-12-26 ENCOUNTER — Other Ambulatory Visit: Payer: Self-pay | Admitting: Allergy and Immunology

## 2018-12-28 ENCOUNTER — Other Ambulatory Visit: Payer: Self-pay | Admitting: Family Medicine

## 2019-01-03 ENCOUNTER — Ambulatory Visit (INDEPENDENT_AMBULATORY_CARE_PROVIDER_SITE_OTHER): Payer: BLUE CROSS/BLUE SHIELD | Admitting: *Deleted

## 2019-01-03 DIAGNOSIS — J309 Allergic rhinitis, unspecified: Secondary | ICD-10-CM | POA: Diagnosis not present

## 2019-01-18 ENCOUNTER — Ambulatory Visit (INDEPENDENT_AMBULATORY_CARE_PROVIDER_SITE_OTHER): Payer: BLUE CROSS/BLUE SHIELD | Admitting: *Deleted

## 2019-01-18 DIAGNOSIS — J309 Allergic rhinitis, unspecified: Secondary | ICD-10-CM

## 2019-02-01 ENCOUNTER — Ambulatory Visit (INDEPENDENT_AMBULATORY_CARE_PROVIDER_SITE_OTHER): Payer: BLUE CROSS/BLUE SHIELD | Admitting: *Deleted

## 2019-02-01 DIAGNOSIS — J309 Allergic rhinitis, unspecified: Secondary | ICD-10-CM | POA: Diagnosis not present

## 2019-02-05 DIAGNOSIS — J019 Acute sinusitis, unspecified: Secondary | ICD-10-CM | POA: Diagnosis not present

## 2019-02-22 ENCOUNTER — Ambulatory Visit (INDEPENDENT_AMBULATORY_CARE_PROVIDER_SITE_OTHER): Payer: BLUE CROSS/BLUE SHIELD | Admitting: *Deleted

## 2019-02-22 DIAGNOSIS — J309 Allergic rhinitis, unspecified: Secondary | ICD-10-CM

## 2019-02-24 ENCOUNTER — Other Ambulatory Visit: Payer: Self-pay | Admitting: Allergy and Immunology

## 2019-03-05 ENCOUNTER — Encounter: Payer: Self-pay | Admitting: *Deleted

## 2019-03-05 ENCOUNTER — Encounter: Payer: Self-pay | Admitting: Gynecology

## 2019-03-05 ENCOUNTER — Other Ambulatory Visit: Payer: Self-pay

## 2019-03-05 ENCOUNTER — Ambulatory Visit: Payer: BC Managed Care – PPO | Admitting: Gynecology

## 2019-03-05 VITALS — BP 128/70

## 2019-03-05 DIAGNOSIS — R300 Dysuria: Secondary | ICD-10-CM

## 2019-03-05 DIAGNOSIS — N3001 Acute cystitis with hematuria: Secondary | ICD-10-CM

## 2019-03-05 MED ORDER — CIPROFLOXACIN HCL 250 MG PO TABS: 250.0000 mg | ORAL_TABLET | Freq: Two times a day (BID) | ORAL | 0 refills | Status: DC

## 2019-03-05 NOTE — Progress Notes (Signed)
Pt needed note for work for visit. KW

## 2019-03-05 NOTE — Patient Instructions (Signed)
Take the prescribed antibiotic twice daily for 7 days.  Follow-up if your symptoms persist, worsen or recur. 

## 2019-03-05 NOTE — Progress Notes (Signed)
    Tina Williams 1961/04/21 [id number]        58 y.o.  [id number] presents with several days of worsening frequency, urgency and dysuria.  Had a little bit of blood in her urine this morning.  Some right-sided pain in her back.  No fever or chills.  Past medical history,surgical history, problem list, medications, allergies, family history and social history were all reviewed and documented in the EPIC chart.  Directed ROS with pertinent positives and negatives documented in the history of present illness/assessment and plan.  Exam: Vitals:   03/05/19 1406  BP: 128/70   General appearance:  Normal Spine straight without CVA tenderness Abdomen soft nontender without masses guarding rebound  Assessment/Plan:  57 y.o. [id number] with history and urine analysis consistent with UTI.  Will treat with ciprofloxacin 250 mg x 7 days.  OTC Azo for bladder symptom relief.  Follow-up if symptoms persist, worsen or recur.    Anastasio Auerbach MD, 2:17 PM 03/05/2019

## 2019-03-06 ENCOUNTER — Telehealth: Payer: Self-pay | Admitting: *Deleted

## 2019-03-06 MED ORDER — NITROFURANTOIN MONOHYD MACRO 100 MG PO CAPS: 100.0000 mg | ORAL_CAPSULE | Freq: Two times a day (BID) | ORAL | 0 refills | Status: DC

## 2019-03-06 NOTE — Telephone Encounter (Signed)
Okay for Macrobid 100 mg twice daily x7 days

## 2019-03-06 NOTE — Telephone Encounter (Signed)
Patient aware Rx sent.  

## 2019-03-06 NOTE — Telephone Encounter (Signed)
Patient called was seen yesterday for UTI and prescribed Cipro 250 mg tablets x 7 days. Patient was not able to pick up Rx because [facility] said Cipro has a drug interaction with flecainide 50 mg tablet.  Please advise

## 2019-03-08 LAB — URINE CULTURE
MICRO NUMBER:: 860454
SPECIMEN QUALITY:: ADEQUATE

## 2019-03-08 LAB — URINALYSIS, COMPLETE W/RFL CULTURE
Bilirubin Urine: NEGATIVE
Glucose, UA: NEGATIVE
Hyaline Cast: NONE SEEN /LPF
Ketones, ur: NEGATIVE
Nitrites, Initial: NEGATIVE
Specific Gravity, Urine: 1.015 (ref 1.001–1.03)
pH: 7 (ref 5.0–8.0)

## 2019-03-08 LAB — CULTURE INDICATED

## 2019-03-15 ENCOUNTER — Ambulatory Visit (INDEPENDENT_AMBULATORY_CARE_PROVIDER_SITE_OTHER): Payer: BC Managed Care – PPO | Admitting: *Deleted

## 2019-03-15 ENCOUNTER — Encounter: Payer: Self-pay | Admitting: Gynecology

## 2019-03-15 DIAGNOSIS — J309 Allergic rhinitis, unspecified: Secondary | ICD-10-CM | POA: Diagnosis not present

## 2019-03-15 MED ORDER — NITROFURANTOIN MONOHYD MACRO 100 MG PO CAPS: 100.0000 mg | ORAL_CAPSULE | Freq: Two times a day (BID) | ORAL | 0 refills | Status: DC

## 2019-03-26 ENCOUNTER — Encounter: Payer: Self-pay | Admitting: Gynecology

## 2019-04-01 ENCOUNTER — Other Ambulatory Visit: Payer: Self-pay

## 2019-04-01 ENCOUNTER — Ambulatory Visit: Payer: BC Managed Care – PPO | Admitting: Allergy and Immunology

## 2019-04-01 ENCOUNTER — Encounter: Payer: Self-pay | Admitting: Allergy and Immunology

## 2019-04-01 VITALS — BP 128/92 | HR 72 | Temp 98.0°F | Resp 12 | Ht 61.0 in | Wt 169.4 lb

## 2019-04-01 DIAGNOSIS — J4530 Mild persistent asthma, uncomplicated: Secondary | ICD-10-CM | POA: Diagnosis not present

## 2019-04-01 DIAGNOSIS — J309 Allergic rhinitis, unspecified: Secondary | ICD-10-CM | POA: Diagnosis not present

## 2019-04-01 DIAGNOSIS — T781XXD Other adverse food reactions, not elsewhere classified, subsequent encounter: Secondary | ICD-10-CM

## 2019-04-01 MED ORDER — MONTELUKAST SODIUM 10 MG PO TABS: ORAL_TABLET | ORAL | 1 refills | Status: DC

## 2019-04-01 MED ORDER — EPINEPHRINE 0.3 MG/0.3ML IJ SOAJ: INTRAMUSCULAR | 3 refills | Status: DC

## 2019-04-01 NOTE — Progress Notes (Signed)
[facility]   Follow-up Note  Referring Provider: Midge Minium, MD Primary Provider: Midge Minium, MD Date of Office Visit: 04/01/2019  Subjective:   Tina Williams (DOB: 06-Feb-1961) is a 58 y.o. female who returns to the [facility] and [facility] on 04/01/2019 in re-evaluation of the following:  HPI: Laresha returns to this clinic in reevaluation of asthma and allergic rhinitis and oral pollinosis syndrome.  Her last visit to this clinic was 27 September 2018.  She has really done quite well with her asthma.  It does not sound as though she has required a systemic steroid to treat an exacerbation.  She can exercise without any problem and does not use a short acting bronchodilator while she continues on Flovent 1 or 2 times per day depending on the season of the year and continues on daily leukotriene modifier.  Usually during spring and fall she will utilize Flovent twice a day.  Her nose has really done quite well.  She uses Flonase on a daily basis.  She will add in nasal antihistamine during the spring.  Apparently she required 1 antibiotic over the course of the past 6 months sometime in the spring for an episode of "sinusitis"  As long as she continues to use Zyrtec on a regular basis her oral pollinosis syndrome has improved dramatically as a result of her immunotherapy.  She can eat fruit of any type with no problem at this point.  Her immunotherapy is going quite well without any adverse effects.  She is currently at every 2-week administration..  Allergies as of 04/01/2019      Reactions   Latex Other (See Comments)   Throat swelling, chest tightness   Amoxicillin Hives   Clarithromycin    REACTION: hives   Naproxen Sodium    REACTION: HIVES   Penicillins    REACTION: hives   Sulfa Antibiotics Hives      Medication List    alendronate 70 MG tablet Commonly known as: FOSAMAX Take 1 tablet (70 mg total) by mouth  every 7 (seven) days. Take with a full glass of water on an empty stomach.   Azelastine HCl 0.15 % Soln   DULoxetine 60 MG capsule Commonly known as: CYMBALTA TAKE 1 CAPSULE BY MOUTH EVERY DAY.   EPINEPHrine 0.3 mg/0.3 mL Soaj injection Commonly known as: EpiPen 2-Pak Use as directed for life threatening allergic reactions   FIBER PO Take 2 tablets by mouth daily as needed.   Flovent HFA 44 MCG/ACT inhaler Generic drug: fluticasone INHALE TWO PUFFS TWICE DAILY TO PREVENT COUGH OR WHEEZE. RINSE MOUTH AFTER USE.   fluticasone 50 MCG/ACT nasal spray Commonly known as: FLONASE   IMMUNE FORMULA PO Take 1 tablet by mouth daily.   montelukast 10 MG tablet Commonly known as: SINGULAIR Take one tablet by mouth once daily as directed. What changed: See the new instructions. Changed by:  Kevan Rosebush, MD   ProAir HFA 108 (90 Base) MCG/ACT inhaler Generic drug: albuterol Inhale into the lungs every 6 (six) hours as needed for wheezing or shortness of breath.   Vitamin B-12 1000 MCG Subl Place 1 tablet under the tongue daily.   Vitamin D-3 125 MCG (5000 UT) Tabs Take 1 tablet by mouth daily.   ZINC PO Take by mouth daily.   ZyrTEC Allergy 10 MG tablet Generic drug: cetirizine Take 10 mg by mouth daily.       Past Medical History:  Diagnosis Date  . Abdominal pain, epigastric 05/13/2014  . Allergic rhinoconjunctivitis 09/27/2017  . Allergy   . Asthma   . ASTHMA 10/09/2008   HFA 75% p coaching  09/27/10    . Depression   . Early menopause   . Fatigue 11/27/2017  . FOOT PAIN, BILATERAL 10/28/2009   Qualifier: Diagnosis of  By: Birdie Riddle MD, Belenda Cruise    . General medical examination 03/23/2011  . H/O oralPollinosis 03/04/2015  . Hx of adenomatous polyp of colon 01/11/2016  . Hyperlipidemia   . MENOPAUSE, EARLY 07/09/2008   Qualifier: Diagnosis of  By: Ronnald Ramp CMA, Chemira    . Mild persistent asthma without complication [id number]  . Moderate recurrent major depression ([place])  01/06/2014  . MYALGIA 11/24/2009   Qualifier: Diagnosis of  By: Birdie Riddle MD, Belenda Cruise    . Non-allergic rhinitis   . Obesity   . OBESITY 07/09/2008   Qualifier: Diagnosis of  By: Birdie Riddle MD, Belenda Cruise    . Oral allergy syndrome, subsequent encounter 03/04/2015  . Osteoarthritis   . Osteoporosis 07/2018   T score -2.9  . Personal history of colonic adenoma  11/26/2009  . Polyarthralgia 10/25/2012  . Respiratory tract infection 03/04/2015  . RHINITIS 10/09/2008   Qualifier: Diagnosis of  By: Birdie Riddle MD, Belenda Cruise    . Sinusitis acute   . Tinnitus of right ear 09/27/2017  . URI (upper respiratory infection)   . Vertigo 09/27/2017  . Vitamin D deficiency 10/28/2016    Past Surgical History:  Procedure Laterality Date  . CESAREAN SECTION  1988  . KNEE ARTHROPLASTY    . KNEE SURGERY Right 1998  . WISDOM TOOTH EXTRACTION  1984    Review of systems negative except as noted in HPI / PMHx or noted below:  Review of Systems  Constitutional: Negative.   HENT: Negative.   Eyes: Negative.   Respiratory: Negative.   Cardiovascular: Negative.   Gastrointestinal: Negative.   Genitourinary: Negative.   Musculoskeletal: Negative.   Skin: Negative.   Neurological: Negative.   Endo/Heme/Allergies: Negative.   Psychiatric/Behavioral: Negative.      Objective:   Vitals:   04/01/19 1016  BP: (!) 128/92  Pulse: 72  Resp: 12  Temp: 98 F (36.7 C)  SpO2: 97%   Height: 5\' 1"  (154.9 cm)  Weight: 169 lb 6.4 oz (76.8 kg)   Physical Exam Constitutional:      Appearance: She is not diaphoretic.  HENT:     Head: Normocephalic.     Right Ear: Tympanic membrane, ear canal and external ear normal.     Left Ear: Tympanic membrane, ear canal and external ear normal.     Nose: Nose normal. No mucosal edema or rhinorrhea.     Mouth/Throat:     Pharynx: Uvula midline. No oropharyngeal exudate.  Eyes:     Conjunctiva/sclera: Conjunctivae normal.  Neck:     Thyroid: No thyromegaly.     Trachea: Trachea  normal. No tracheal tenderness or tracheal deviation.  Cardiovascular:     Rate and Rhythm: Normal rate and regular rhythm.     Heart sounds: Normal heart sounds, S1 normal and S2 normal. No murmur.  Pulmonary:     Effort: No respiratory distress.     Breath sounds: Normal breath sounds. No stridor. No wheezing or rales.  Lymphadenopathy:     Head:     Right side of head: No tonsillar adenopathy.     Left side of head: No tonsillar adenopathy.     Cervical: No  cervical adenopathy.  Skin:    Findings: No erythema or rash.     Nails: There is no clubbing.   Neurological:     Mental Status: She is alert.     Diagnostics:    Spirometry was performed and demonstrated an FEV1 of 2.05 at 88 % of predicted.  Assessment and Plan:   1. Asthma, well controlled, mild persistent   2. Allergic rhinitis, unspecified seasonality, unspecified trigger   3. Oral allergy syndrome, subsequent encounter      1. Continue Immunotherapy and Epi-Pen  2. Continue Flonase  1-2 sprays each nostril 1-2 times per day   3. Continue Flovent 44 - two inhalations 1-2 times per day depending on asthma activity.   4. Continue Montelukast 10mg  one tablet one time per day  5. Continue zyrtec, nasal saline, nasal azelastine,  if needed  6. Continue Proair HFA if needed  7. Return in 6 months or earlier if problem  8.  Obtain fall flu vaccine (and COVID vaccine)  Raeann is really doing quite well on her current therapy.  She will continue to utilize anti-inflammatory agents for her airway and continue to utilize immunotherapy directed against various aeroallergens.  She has a very good understanding of her medical condition and appropriate use of her medications depending on disease activity.  I will see her back in this clinic in 6 months or earlier if there is a problem.  Allena Katz, MD Allergy / Immunology [place]

## 2019-04-01 NOTE — Patient Instructions (Addendum)
  1. Continue Immunotherapy and Epi-Pen  2. Continue Flonase  1-2 sprays each nostril 1-2 times per day   3. Continue Flovent 44 - two inhalations 1-2 times per day depending on asthma activity.   4. Continue Montelukast 10mg  one tablet one time per day  5. Continue zyrtec, nasal saline, nasal azelastine,  if needed  6. Continue Proair HFA if needed  7. Return in 6 months or earlier if problem  8.  Obtain fall flu vaccine (and COVID vaccine)

## 2019-04-02 ENCOUNTER — Encounter: Payer: Self-pay | Admitting: Allergy and Immunology

## 2019-04-22 ENCOUNTER — Ambulatory Visit (INDEPENDENT_AMBULATORY_CARE_PROVIDER_SITE_OTHER): Payer: BC Managed Care – PPO | Admitting: *Deleted

## 2019-04-22 DIAGNOSIS — J309 Allergic rhinitis, unspecified: Secondary | ICD-10-CM

## 2019-04-25 ENCOUNTER — Ambulatory Visit (INDEPENDENT_AMBULATORY_CARE_PROVIDER_SITE_OTHER): Payer: BC Managed Care – PPO

## 2019-04-25 ENCOUNTER — Other Ambulatory Visit: Payer: Self-pay

## 2019-04-25 DIAGNOSIS — Z23 Encounter for immunization: Secondary | ICD-10-CM | POA: Diagnosis not present

## 2019-04-27 ENCOUNTER — Other Ambulatory Visit: Payer: Self-pay | Admitting: Family Medicine

## 2019-04-29 ENCOUNTER — Ambulatory Visit (INDEPENDENT_AMBULATORY_CARE_PROVIDER_SITE_OTHER): Payer: BC Managed Care – PPO | Admitting: *Deleted

## 2019-04-29 DIAGNOSIS — J309 Allergic rhinitis, unspecified: Secondary | ICD-10-CM | POA: Diagnosis not present

## 2019-05-08 ENCOUNTER — Encounter: Payer: Self-pay | Admitting: Cardiology

## 2019-05-08 ENCOUNTER — Ambulatory Visit (INDEPENDENT_AMBULATORY_CARE_PROVIDER_SITE_OTHER): Payer: BC Managed Care – PPO | Admitting: Cardiology

## 2019-05-08 ENCOUNTER — Other Ambulatory Visit: Payer: Self-pay

## 2019-05-08 VITALS — BP 114/80 | HR 84 | Ht 61.0 in | Wt 170.0 lb

## 2019-05-08 DIAGNOSIS — R002 Palpitations: Secondary | ICD-10-CM | POA: Diagnosis not present

## 2019-05-08 DIAGNOSIS — I491 Atrial premature depolarization: Secondary | ICD-10-CM | POA: Diagnosis not present

## 2019-05-08 DIAGNOSIS — I493 Ventricular premature depolarization: Secondary | ICD-10-CM | POA: Diagnosis not present

## 2019-05-08 NOTE — Patient Instructions (Signed)
Medication Instructions:  Your physician recommends that you continue on your current medications as directed. Please refer to the Current Medication list given to you today.  *If you need a refill on your cardiac medications before your next appointment, please call your pharmacy*  Lab Work: None  If you have labs (blood work) drawn today and your tests are completely normal, you will receive your results only by: . MyChart Message (if you have MyChart) OR . A paper copy in the mail If you have any lab test that is abnormal or we need to change your treatment, we will call you to review the results.  Testing/Procedures: You had an EKG today.   Follow-Up: At [facility], you and your health needs are our priority.  As part of our continuing mission to provide you with exceptional heart care, we have created designated Provider Care Teams.  These Care Teams include your primary Cardiologist (physician) and Advanced Practice Providers (APPs -  Physician Assistants and Nurse Practitioners) who all work together to provide you with the care you need, when you need it.  Your next appointment:   6 month(s)  The format for your next appointment:   In Person  Provider:   Brian Munley, MD   

## 2019-05-08 NOTE — Progress Notes (Signed)
Cardiology Office Note:    Date:  05/08/2019   ID:  Tina Williams, DOB 1960-12-04, MRN [id number]  PCP:  Midge Minium, MD  Cardiologist:  Shirlee More, MD    Referring MD: Midge Minium, MD    ASSESSMENT:    1. Palpitation   2. APC (atrial premature contractions)   3. Frequent PVCs    PLAN:    In order of problems listed above:  1. She has had good clinical success amelioration of symptoms continue low-dose flecainide no signs of toxicity plan to see in the office in 6 months or sooner.   Next appointment: 6 months   Medication Adjustments/Labs and Tests Ordered: Current medicines are reviewed at length with the patient today.  Concerns regarding medicines are outlined above.  No orders of the defined types were placed in this encounter.  No orders of the defined types were placed in this encounter.   No chief complaint on file.   History of Present Illness:    Tina Williams is a 58 y.o. female with a hx of palpitation with frequent APC's started on flecanide 01/24/18    last seen 07/20/2018. Compliance with diet, lifestyle and medications: Yes  She has done well with flecainide and her symptomatic arrhythmia has resolved.  COVID-19 is her very apprehensive and at times she gets vague chest discomfort it is extremely vague mild brief not anginal in nature and she thinks is due to anxiety.  At this time I do not think she requires further evaluation like an ischemia test.  A normal myocardial perfusion study performed 12/20/2017 Past Medical History:  Diagnosis Date  . Abdominal pain, epigastric 05/13/2014  . Allergic rhinoconjunctivitis 09/27/2017  . Allergy   . Asthma   . ASTHMA 10/09/2008   HFA 75% p coaching  09/27/10    . Depression   . Early menopause   . Fatigue 11/27/2017  . FOOT PAIN, BILATERAL 10/28/2009   Qualifier: Diagnosis of  By: Birdie Riddle MD, Belenda Cruise    . General medical examination 03/23/2011  . H/O oralPollinosis 03/04/2015  . Hx of  adenomatous polyp of colon 01/11/2016  . Hyperlipidemia   . MENOPAUSE, EARLY 07/09/2008   Qualifier: Diagnosis of  By: Ronnald Ramp CMA, Chemira    . Mild persistent asthma without complication [id number]  . Moderate recurrent major depression ([place]) 01/06/2014  . MYALGIA 11/24/2009   Qualifier: Diagnosis of  By: Birdie Riddle MD, Belenda Cruise    . Non-allergic rhinitis   . Obesity   . OBESITY 07/09/2008   Qualifier: Diagnosis of  By: Birdie Riddle MD, Belenda Cruise    . Oral allergy syndrome, subsequent encounter 03/04/2015  . Osteoarthritis   . Osteoporosis 07/2018   T score -2.9  . Personal history of colonic adenoma  11/26/2009  . Polyarthralgia 10/25/2012  . Respiratory tract infection 03/04/2015  . RHINITIS 10/09/2008   Qualifier: Diagnosis of  By: Birdie Riddle MD, Belenda Cruise    . Sinusitis acute   . Tinnitus of right ear 09/27/2017  . URI (upper respiratory infection)   . Vertigo 09/27/2017  . Vitamin D deficiency 10/28/2016    Past Surgical History:  Procedure Laterality Date  . CESAREAN SECTION  1988  . KNEE ARTHROPLASTY    . KNEE SURGERY Right 1998  . WISDOM TOOTH EXTRACTION  1984    Current Medications: Current Meds  Medication Sig  . albuterol (PROAIR HFA) 108 (90 Base) MCG/ACT inhaler Inhale into the lungs every 6 (six) hours as needed for wheezing or shortness of breath.  Marland Kitchen  alendronate (FOSAMAX) 70 MG tablet Take 1 tablet (70 mg total) by mouth every 7 (seven) days. Take with a full glass of water on an empty stomach.  . cetirizine (ZYRTEC ALLERGY) 10 MG tablet Take 10 mg by mouth daily.  . Cholecalciferol (VITAMIN D-3) 5000 units TABS Take 1 tablet by mouth daily.  . Cyanocobalamin (VITAMIN B-12) 1000 MCG SUBL Place 1 tablet under the tongue daily.  . DULoxetine (CYMBALTA) 60 MG capsule TAKE 1 CAPSULE BY MOUTH EVERY DAY  . EPINEPHrine (EPIPEN 2-PAK) 0.3 mg/0.3 mL IJ SOAJ injection Use as directed for life-threatening allergic reactions  . FIBER PO Take 2 tablets by mouth daily as needed.   . fluticasone (FLOVENT  HFA) 44 MCG/ACT inhaler INHALE TWO PUFFS TWICE DAILY TO PREVENT COUGH OR WHEEZE. RINSE MOUTH AFTER USE.  Marland Kitchen Misc Natural Products (IMMUNE FORMULA PO) Take 1 tablet by mouth daily.  . montelukast (SINGULAIR) 10 MG tablet Take one tablet by mouth once daily as directed.  . Multiple Vitamins-Minerals (ZINC PO) Take by mouth daily.     Allergies:   Latex, Amoxicillin, Clarithromycin, Naproxen sodium, Penicillins, and Sulfa antibiotics   Social History   Socioeconomic History  . Marital status: Married    Spouse name: Not on file  . Number of children: 3  . Years of education: Not on file  . Highest education level: Not on file  Occupational History  . Occupation: Research scientist (life sciences) Needs  . Financial resource strain: Not on file  . Food insecurity    Worry: Not on file    Inability: Not on file  . Transportation needs    Medical: Not on file    Non-medical: Not on file  Tobacco Use  . Smoking status: Former Smoker    Packs/day: 0.50    Years: 18.00    Pack years: 9.00    Types: Cigarettes    Quit date: 06/28/1999    Years since quitting: 19.8  . Smokeless tobacco: Never Used  Substance and Sexual Activity  . Alcohol use: Yes    Alcohol/week: 7.0 standard drinks    Types: 7 Glasses of wine per week    Comment: weekly  . Drug use: Never  . Sexual activity: Yes    Comment: 1st intercourse 58 yo-Fewer than 5 partners  Lifestyle  . Physical activity    Days per week: Not on file    Minutes per session: Not on file  . Stress: Not on file  Relationships  . Social Herbalist on phone: Not on file    Gets together: Not on file    Attends religious service: Not on file    Active member of club or organization: Not on file    Attends meetings of clubs or organizations: Not on file    Relationship status: Not on file  Other Topics Concern  . Not on file  Social History Narrative  . Not on file     Family History: The patient's family history includes Colon  cancer (age of onset: 20) in her sister; Diabetes in her brother, mother, and sister; Emphysema in her father; Epilepsy in her sister; Healthy in her son, son, and son; Heart Problems in her son; Hypertension in her brother and mother; Lung cancer in her sister. ROS:   Please see the history of present illness.    All other systems reviewed and are negative.  EKGs/Labs/Other Studies Reviewed:    The following studies were reviewed today:  EKG:  EKG ordered today and personally reviewed.  The ekg ordered today demonstrates sinus rhythm normal QRS duration 88 ms no signs of toxicity  Recent Labs: 07/25/2018: ALT 11; BUN 13; Creatinine, Ser 0.65; Hemoglobin 13.3; Platelets 475.0; Potassium 4.4; Sodium 139; TSH 1.72  Recent Lipid Panel    Component Value Date/Time   CHOL 217 (H) 07/25/2018 1154   TRIG 108.0 07/25/2018 1154   HDL 51.40 07/25/2018 1154   CHOLHDL 4 07/25/2018 1154   VLDL 21.6 07/25/2018 1154   LDLCALC 144 (H) 07/25/2018 1154   LDLDIRECT 161.6 03/23/2011 1331    Physical Exam:    VS:  BP 114/80 (BP Location: Right Arm, Patient Position: Sitting, Cuff Size: Normal)   Pulse 84   Ht 5\' 1"  (1.549 m)   Wt 170 lb (77.1 kg)   SpO2 99%   BMI 32.12 kg/m     Wt Readings from Last 3 Encounters:  05/08/19 170 lb (77.1 kg)  04/01/19 169 lb 6.4 oz (76.8 kg)  08/01/18 168 lb (76.2 kg)     GEN:  Well nourished, well developed in no acute distress HEENT: Normal NECK: No JVD; No carotid bruits LYMPHATICS: No lymphadenopathy CARDIAC: RRR, no murmurs, rubs, gallops RESPIRATORY:  Clear to auscultation without rales, wheezing or rhonchi  ABDOMEN: Soft, non-tender, non-distended MUSCULOSKELETAL:  No edema; No deformity  SKIN: Warm and dry NEUROLOGIC:  Alert and oriented x 3 PSYCHIATRIC:  Normal affect    Signed, Shirlee More, MD  05/08/2019 2:43 PM    [place]

## 2019-05-12 ENCOUNTER — Encounter: Payer: Self-pay | Admitting: Gynecology

## 2019-05-14 ENCOUNTER — Encounter: Payer: Self-pay | Admitting: Gynecology

## 2019-05-14 ENCOUNTER — Ambulatory Visit: Payer: BC Managed Care – PPO | Admitting: Gynecology

## 2019-05-14 ENCOUNTER — Other Ambulatory Visit: Payer: Self-pay

## 2019-05-14 VITALS — BP 124/80

## 2019-05-14 DIAGNOSIS — R300 Dysuria: Secondary | ICD-10-CM

## 2019-05-14 MED ORDER — NITROFURANTOIN MONOHYD MACRO 100 MG PO CAPS: 100.0000 mg | ORAL_CAPSULE | Freq: Two times a day (BID) | ORAL | 0 refills | Status: DC

## 2019-05-14 NOTE — Patient Instructions (Signed)
Take the antibiotic twice daily for 7 days.  Follow-up if your symptoms persist. 

## 2019-05-14 NOTE — Progress Notes (Signed)
    Tina Williams [birth year] [id number]        58 y.o.  G4P0013 presents with several days of frequency urgency and dysuria.  Has used OTC AZO.  Recent history of treated UTI with E. coli.  Used Macrobid with success.  No fever chills low back pain.  No vaginal discharge odor irritation  Past medical history,surgical history, problem list, medications, allergies, family history and social history were all reviewed and documented in the EPIC chart.  Directed ROS with pertinent positives and negatives documented in the history of present illness/assessment and plan.  Exam: Vitals:   05/14/19 1142  BP: 124/80   General appearance:  Normal Spine straight without CVA tenderness Abdomen soft nontender without masses guarding rebound  Assessment/Plan:  58 y.o. [name] with history and exam as above.  Urine analysis shows positive nitrate but otherwise negative.  Suspect early UTI.  Will cover with Macrobid 100 mg twice daily x7 days.  Follow-up if symptoms persist, worsen or recur.    Tina Auerbach MD, 12:06 PM 05/14/2019

## 2019-05-16 LAB — URINALYSIS, COMPLETE W/RFL CULTURE
Bacteria, UA: NONE SEEN /HPF
Bilirubin Urine: NEGATIVE
Glucose, UA: NEGATIVE
Hgb urine dipstick: NEGATIVE
Hyaline Cast: NONE SEEN /LPF
Ketones, ur: NEGATIVE
Leukocyte Esterase: NEGATIVE
Nitrites, Initial: POSITIVE — AB
Protein, ur: NEGATIVE
RBC / HPF: NONE SEEN /HPF (ref 0–2)
Specific Gravity, Urine: 1.015 (ref 1.001–1.03)
pH: 7 (ref 5.0–8.0)

## 2019-05-16 LAB — URINE CULTURE
MICRO NUMBER:: 1109325
Result:: NO GROWTH
SPECIMEN QUALITY:: ADEQUATE

## 2019-05-16 LAB — CULTURE INDICATED

## 2019-05-17 ENCOUNTER — Ambulatory Visit (INDEPENDENT_AMBULATORY_CARE_PROVIDER_SITE_OTHER): Payer: BC Managed Care – PPO

## 2019-05-17 DIAGNOSIS — J309 Allergic rhinitis, unspecified: Secondary | ICD-10-CM

## 2019-05-24 DIAGNOSIS — Z20828 Contact with and (suspected) exposure to other viral communicable diseases: Secondary | ICD-10-CM | POA: Diagnosis not present

## 2019-05-24 DIAGNOSIS — R519 Headache, unspecified: Secondary | ICD-10-CM | POA: Diagnosis not present

## 2019-05-25 ENCOUNTER — Other Ambulatory Visit: Payer: Self-pay | Admitting: Cardiology

## 2019-05-27 ENCOUNTER — Telehealth: Payer: Self-pay

## 2019-05-27 NOTE — Telephone Encounter (Signed)
FYI:  Patient Name: Tina Williams Gender: Female DOB: 1960/08/27 Age: 58 Y 85 M 15 D Return Phone Number: [id number] (Primary) [place] [address] Client [name] Client Site [name] Physician Dimple Nanas- MD Contact Type Call Who Is Calling Patient / Member / Family / Caregiver Call Type Triage / Clinical Relationship To Patient Self Return Phone Number [phone] (Primary) Chief Complaint Nasal Congestion Reason for Call Symptomatic / Request for [name] states that she has head and chest congestion. Should she get tested before going back to work? Translation No Nurse Assessment Nurse: Alveta Heimlich, RN, Rise Paganini Date/Time (Eastern Time): 05/24/2019 12:11:29 PM Confirm and document reason for call. If symptomatic, describe symptoms. ---Caller states she called work to tell them she has head congestion and chest congestion. She started sneezing last night. She was raspy and felt like it was getting in chest. She took a zyrtec and mucinex . Helped a little. Still congested. Her work wants her to call her Dr. to see if she should be tested. She is supposed to go back to work today on 3-11 shift. No fever or other symptoms like shortness of breath. Has the patient had close contact with a person known or suspected to have the novel coronavirus illness OR traveled / lives in area with major community spread (including international travel) in the last 14 days from the onset of symptoms? * If Asymptomatic, screen for exposure and travel within the last 14 days. ---No Does the patient have any new or worsening symptoms? ---Yes Will a triage be completed? ---Yes Related visit to physician within the last 2 weeks? ---No Does the PT have any chronic conditions? (i.e. diabetes, asthma, this includes High risk factors for pregnancy, etc.)  ---Yes List chronic conditions. ---asthma, allergies, irregular heart beat Is this a behavioral health or substance abuse call? ---No PLEASE NOTE: All timestamps contained within this report are represented as [place] Standard Time. CONFIDENTIALTY NOTICE: This fax transmission is intended only for the addressee. It contains information that is legally privileged, confidential or otherwise protected from use or disclosure. If you are not the intended recipient, you are strictly prohibited from reviewing, disclosing, copying using or disseminating any of this information or taking any action in reliance on or regarding this information. If you have received this fax in error, please notify us immediately by telephone so that we can arrange for its return to [place]. Phone: [phone], Toll-Free: [phone], Fax: [phone] Page: 2 of 3 Call Id: [id number] Guidelines Guideline Title Affirmed Question Affirmed Notes Nurse Date/Time Eilene Ghazi Time) Coronavirus (COVID-19) - Diagnosed or Suspected [1] HIGH RISK patient (e.g., age > 52 years, diabetes, heart or lung disease, weak immune system) AND [2] new or worsening symptoms Myer Haff 05/24/2019 12:17:22 PM Disp. Time Eilene Ghazi Time) Disposition Final User 05/24/2019 12:29:10 PM Paged On Call back to Call [place], [place], Rise Paganini 05/24/2019 12:20:07 PM Call PCP Now Yes Alveta Heimlich, RN, Ali Lowe Disagree/Comply Comply Caller Understands Yes PreDisposition Call Doctor Care Advice Given Per Guideline CALL PCP NOW: * I'll page the on-call provider now. If you haven't heard from the provider (or me) within 30 minutes, call again. HOW TO PROTECT OTHERS - WHEN YOU ARE SICK WITH COVID-19: * STAY HOME A MINIMUM OF 10 DAYS: Home isolation is needed for at least 10 days after the symptoms started. Stay home from school or work if you are sick. Do NOT  go to religious services, child care centers, shopping, or other public places. Do NOT use public  transportation (e.g., bus, taxis, ride-sharing). Do NOT allow any visitors to your home. Leave the house only if you need to seek urgent medical care. GENERAL CARE ADVICE FOR COVID-19 SYMPTOMS: * Fever: For fever over 101 F (38.3 C), take acetaminophen every 4-6 hours (Adults 650 mg) OR ibuprofen every 6-8 hours (Adults 400 mg). Before taking any medicine, read all the instructions on the package. Do not take aspirin unless your doctor has prescribed it for you. CALL BACK IF: * You become worse. CARE ADVICE given per CORONAVIRUS (COVID-19) - DIAGNOSED OR SUSPECTED (Adult) guideline. Comments User: Debby Bud, RN Date/Time Eilene Ghazi Time): 05/24/2019 12:37:55 PM Called her back and told her that the on call physician said she does need to be tested. Call [facility] or any other testing site she may know of and get in to get test. Also the Dept of [place] has site listed on the website. Look there is unable to find a place. Told her to call back for any worsening condition. She says she will. Paging DoctorName Phone DateTime Result/Outcome Message Type Notes Dr. Waunita Schooner [id number] 05/24/2019 12:29:10 PM Paged On Call Back to Call Center Doctor Paged Dr. Waunita Schooner 05/24/2019 12:33:36 PM Spoke with On Call - General Message Result Report given. Patient should be tested for covid. Alot of the testing sites are closed today. The ones that they use are closed for holiday. She needs to call [facility] or any drive thru sites to see if she can get in. Also the [place] [facility] has testing sites on the website

## 2019-05-31 ENCOUNTER — Ambulatory Visit (INDEPENDENT_AMBULATORY_CARE_PROVIDER_SITE_OTHER): Payer: BC Managed Care – PPO | Admitting: *Deleted

## 2019-05-31 DIAGNOSIS — J309 Allergic rhinitis, unspecified: Secondary | ICD-10-CM | POA: Diagnosis not present

## 2019-06-14 ENCOUNTER — Ambulatory Visit (INDEPENDENT_AMBULATORY_CARE_PROVIDER_SITE_OTHER): Payer: BC Managed Care – PPO

## 2019-06-14 DIAGNOSIS — J309 Allergic rhinitis, unspecified: Secondary | ICD-10-CM | POA: Diagnosis not present

## 2019-07-08 ENCOUNTER — Ambulatory Visit (INDEPENDENT_AMBULATORY_CARE_PROVIDER_SITE_OTHER): Payer: BC Managed Care – PPO | Admitting: *Deleted

## 2019-07-08 DIAGNOSIS — J309 Allergic rhinitis, unspecified: Secondary | ICD-10-CM

## 2019-07-09 NOTE — Progress Notes (Signed)
Vials exp 07-08-20

## 2019-07-10 DIAGNOSIS — J301 Allergic rhinitis due to pollen: Secondary | ICD-10-CM | POA: Diagnosis not present

## 2019-07-15 DIAGNOSIS — Z20822 Contact with and (suspected) exposure to covid-19: Secondary | ICD-10-CM | POA: Diagnosis not present

## 2019-07-15 DIAGNOSIS — R0981 Nasal congestion: Secondary | ICD-10-CM | POA: Diagnosis not present

## 2019-07-15 DIAGNOSIS — B349 Viral infection, unspecified: Secondary | ICD-10-CM | POA: Diagnosis not present

## 2019-07-19 ENCOUNTER — Ambulatory Visit (INDEPENDENT_AMBULATORY_CARE_PROVIDER_SITE_OTHER): Payer: BC Managed Care – PPO | Admitting: *Deleted

## 2019-07-19 DIAGNOSIS — J309 Allergic rhinitis, unspecified: Secondary | ICD-10-CM | POA: Diagnosis not present

## 2019-07-22 IMAGING — MR MR FOOT*L* W/O CM
4 of 6 series · 19 of 40 positions shown · non-contrast
Comparison: Radiographs dated 04/18/2018

CLINICAL DATA: Left foot pain and swelling at the third digit.

EXAM:
MRI OF THE LEFT FOOT WITHOUT CONTRAST
TECHNIQUE: Multiplanar, multisequence MR imaging of the left foot was
performed. No intravenous contrast was administered.

[Series 3: T1 · coronal · 3.0mm · 0.23mm/px · 9 of 39 slices shown (1 of 2)]
[im 1/39]
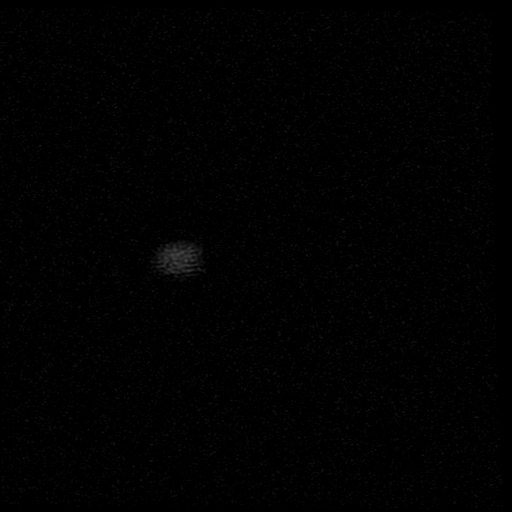
[im 5/39]
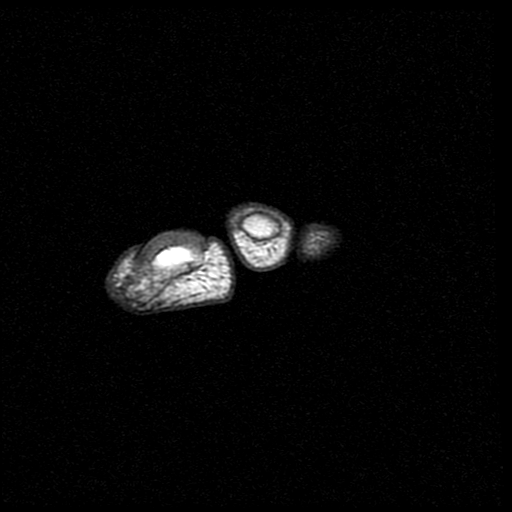
[im 10/39]
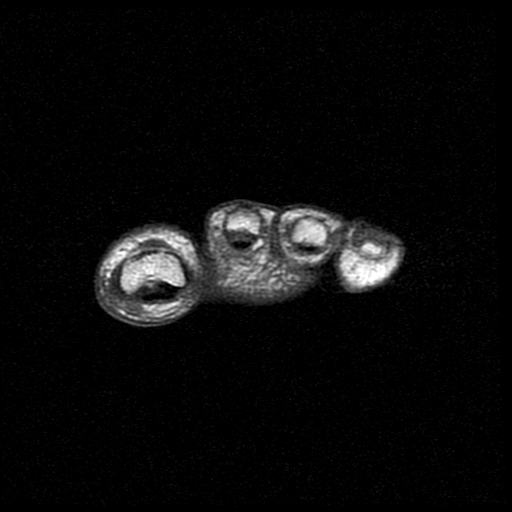
[im 15/39]
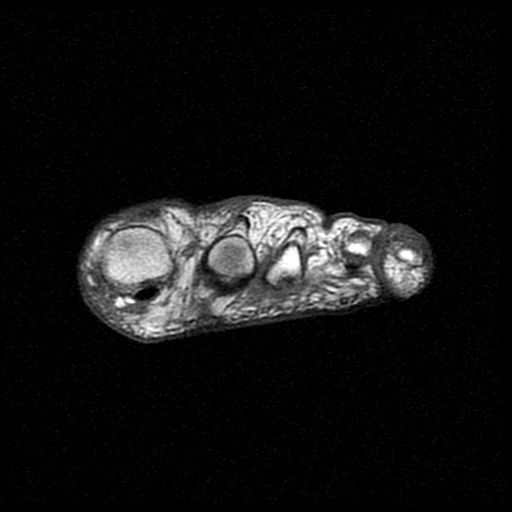
[im 20/39]
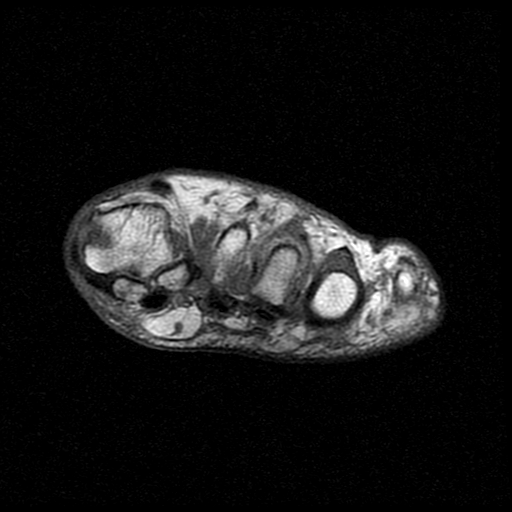
[im 24/39]
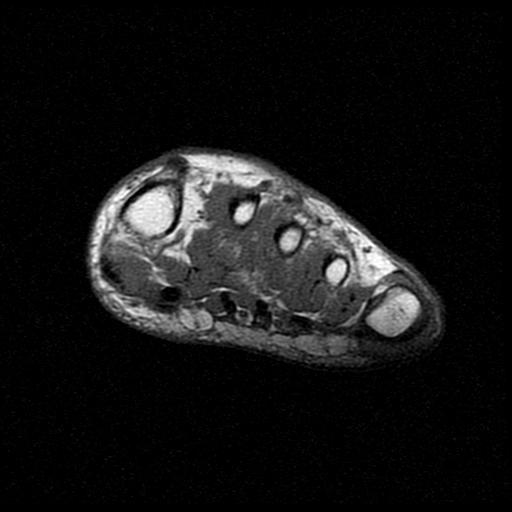
[im 29/39]
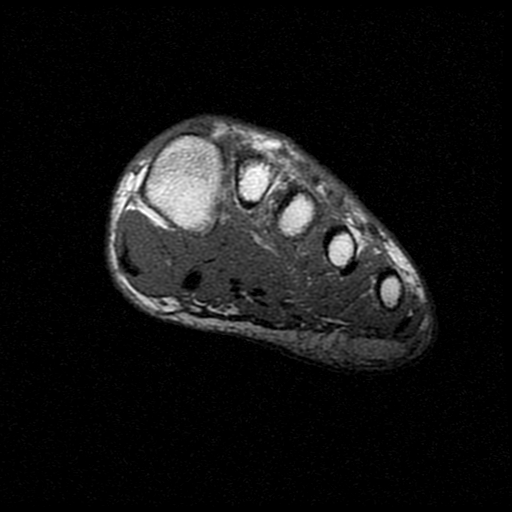
[im 34/39]
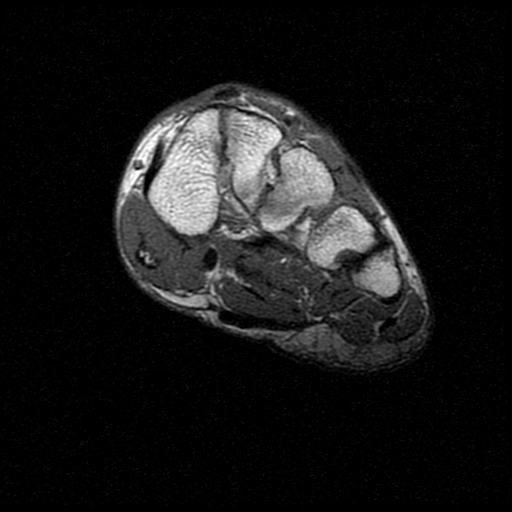
[im 39/39]
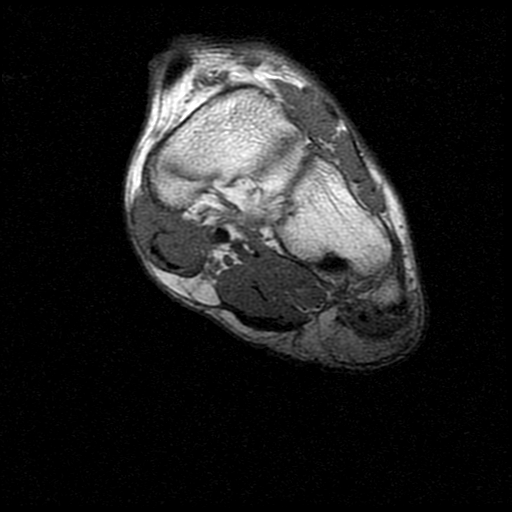

[Series 4: T2 · coronal · 3.0mm · 0.23mm/px · 3 of 39 slices shown (1 of 2)]
[im 5/39]
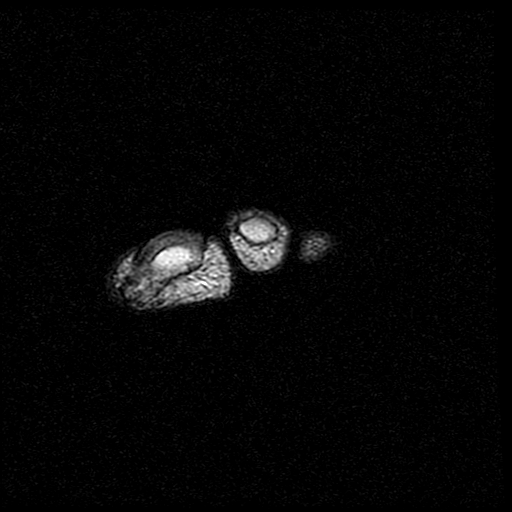
[im 20/39]
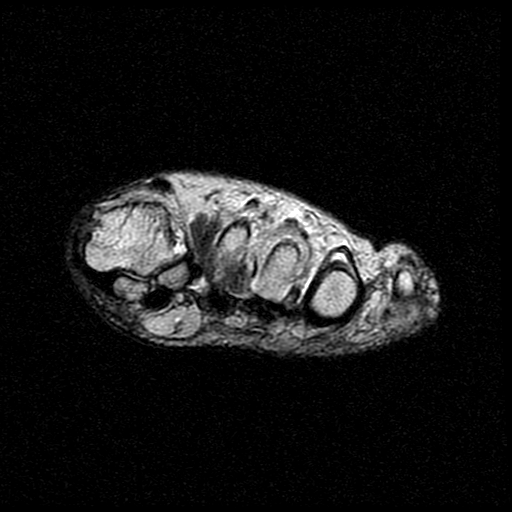
[im 34/39]
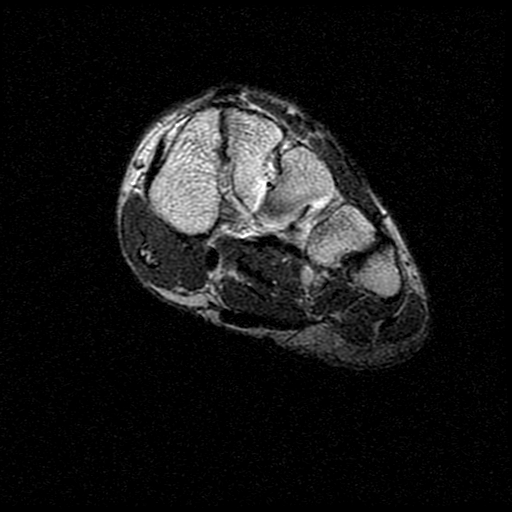

[Series 5: T2 · axial · 3.0mm · 0.35mm/px · z∈[-66,+11]mm · 3 of 21 slices shown (2 of 2)]
[im 1/21]
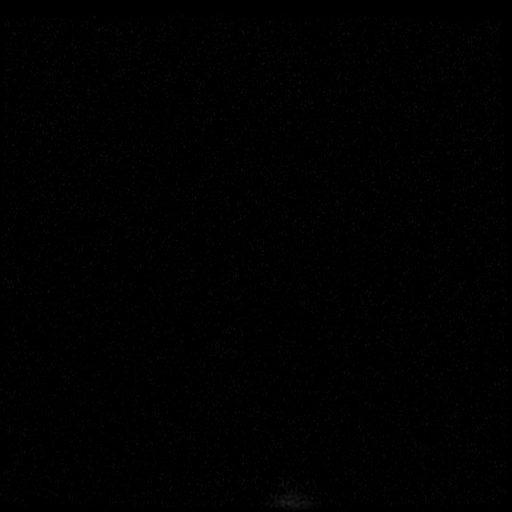
[im 14/21]
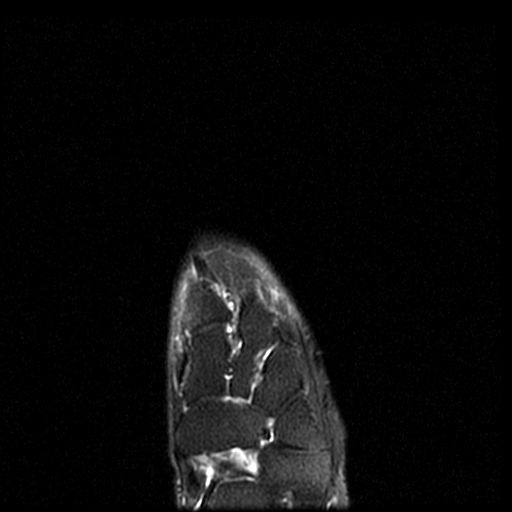
[im 21/21]
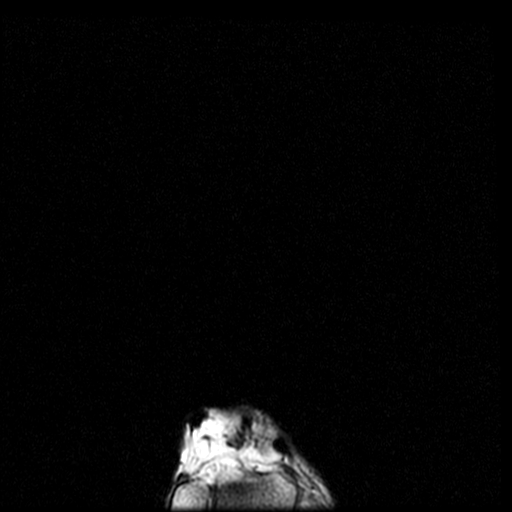

[Series 6: T1 · axial · 3.0mm · 0.35mm/px · z∈[-66,+11]mm · 4 of 21 slices shown (2 of 2)]
[im 1/21]
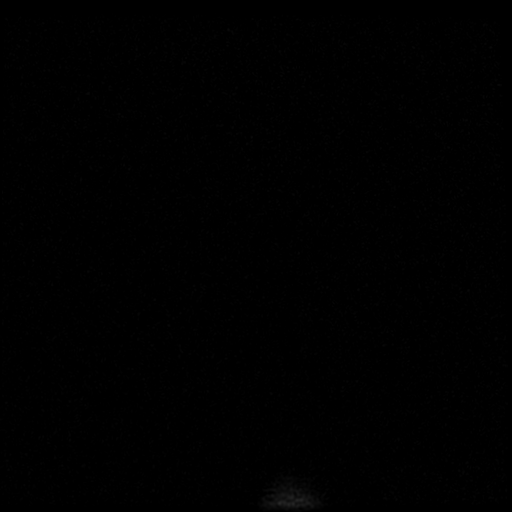
[im 7/21]
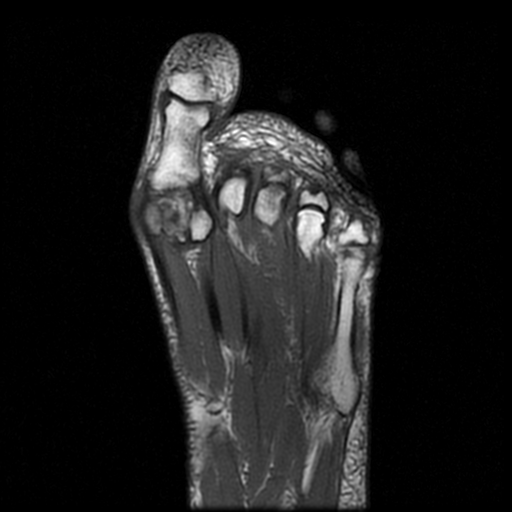
[im 14/21]
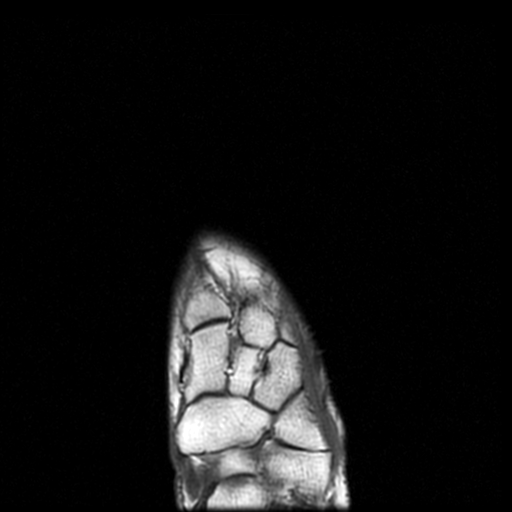
[im 21/21]
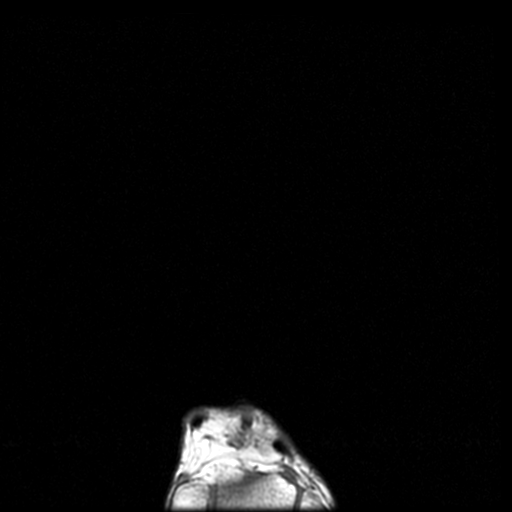

[19 of 40 positions shown; findings below may reference images not displayed]

FINDINGS: Bones/Joint/Cartilage

There is prominent edema in the shaft and head of the third
metatarsal with edema in the adjacent soft tissues with a small
effusion at the third MTP joint. There is a small effusion in the
adjacent fourth MTP joint.

There are slight arthritic changes of the first MTP joint with
bunion formation on the head of the first metatarsal.

Muscles and Tendons

Normal.

Soft tissues

Edema in the soft tissues around the third metatarsal. Otherwise
negative.
IMPRESSION: Findings consistent with a stress reaction of the third metatarsal.

Arthritic changes at the first MTP joint.

## 2019-08-01 DIAGNOSIS — H5213 Myopia, bilateral: Secondary | ICD-10-CM | POA: Diagnosis not present

## 2019-08-08 ENCOUNTER — Ambulatory Visit (INDEPENDENT_AMBULATORY_CARE_PROVIDER_SITE_OTHER): Payer: BC Managed Care – PPO | Admitting: *Deleted

## 2019-08-08 DIAGNOSIS — J309 Allergic rhinitis, unspecified: Secondary | ICD-10-CM

## 2019-08-22 ENCOUNTER — Ambulatory Visit (INDEPENDENT_AMBULATORY_CARE_PROVIDER_SITE_OTHER): Payer: BC Managed Care – PPO | Admitting: *Deleted

## 2019-08-22 DIAGNOSIS — J309 Allergic rhinitis, unspecified: Secondary | ICD-10-CM | POA: Diagnosis not present

## 2019-09-02 ENCOUNTER — Other Ambulatory Visit: Payer: Self-pay

## 2019-09-02 ENCOUNTER — Encounter: Payer: Self-pay | Admitting: Obstetrics & Gynecology

## 2019-09-02 ENCOUNTER — Ambulatory Visit: Payer: BC Managed Care – PPO | Admitting: Obstetrics & Gynecology

## 2019-09-02 VITALS — BP 122/78

## 2019-09-02 DIAGNOSIS — R300 Dysuria: Secondary | ICD-10-CM

## 2019-09-02 DIAGNOSIS — R82998 Other abnormal findings in urine: Secondary | ICD-10-CM

## 2019-09-02 MED ORDER — NITROFURANTOIN MONOHYD MACRO 100 MG PO CAPS: 100.0000 mg | ORAL_CAPSULE | Freq: Two times a day (BID) | ORAL | 0 refills | Status: AC

## 2019-09-02 NOTE — Progress Notes (Signed)
    Tina Williams 07/22/1960 [id number]        59 y.o.  G4P0013   RP: Dysuria x 3 days  HPI: Cystitis in 02/2019.  Burning with urination x 2-3 days.  Some frequency.  No blood in urine.  No back or flank pain.  No fever.  Osteoporosis on Ca++Vit D supplements.   OB History  Gravida Para Term Preterm AB Living  4 3     1 3   SAB TAB Ectopic Multiple Live Births  1       3    # Outcome Date GA Lbr Len/2nd Weight Sex Delivery Anes PTL Lv  4 Para           3 Para           2 Para           1 SAB             Past medical history,surgical history, problem list, medications, allergies, family history and social history were all reviewed and documented in the EPIC chart.   Directed ROS with pertinent positives and negatives documented in the history of present illness/assessment and plan.  Exam:  Vitals:   09/02/19 1414  BP: 122/78   General appearance:  Normal  CVAT Negative bilaterally  Abdomen: Normal  Gynecologic exam: Deferred  U/A: Orange cloudy, white blood cells 6-10, red blood cells 0-2, few bacteria, moderate calcium oxalate.  Urine culture pending.   Assessment/Plan:  59 y.o. G4P0013   1. Dysuria Probable acute cystitis per urine analysis and symptoms.  Decision to treat with Macrobid 1 tablet twice a day for 7 days.  Usage reviewed and prescription sent to pharmacy.  Urine culture pending. - Urinalysis,Complete w/RFL Culture  2. Calcium oxalate crystals in urine Moderate calcium oxalate in urine.  Recommend decreasing calcium supplementation.  Risk of calcium type of kidney stones discussed.  Other orders - nitrofurantoin, macrocrystal-monohydrate, (MACROBID) 100 MG capsule; Take 1 capsule (100 mg total) by mouth 2 (two) times daily for 7 days.  Princess Bruins MD, 2:28 PM 09/02/2019

## 2019-09-04 LAB — URINALYSIS, COMPLETE W/RFL CULTURE: Hyaline Cast: NONE SEEN /LPF

## 2019-09-04 LAB — URINE CULTURE
MICRO NUMBER:: 10225487
Result:: NO GROWTH
SPECIMEN QUALITY:: ADEQUATE

## 2019-09-04 LAB — CULTURE INDICATED

## 2019-09-07 ENCOUNTER — Encounter: Payer: Self-pay | Admitting: Obstetrics & Gynecology

## 2019-09-07 NOTE — Patient Instructions (Signed)
1. Dysuria Probable acute cystitis per urine analysis and symptoms.  Decision to treat with Macrobid 1 tablet twice a day for 7 days.  Usage reviewed and prescription sent to pharmacy.  Urine culture pending. - Urinalysis,Complete w/RFL Culture  2. Calcium oxalate crystals in urine Moderate calcium oxalate in urine.  Recommend decreasing calcium supplementation.  Risk of calcium type of kidney stones discussed.  Other orders - nitrofurantoin, macrocrystal-monohydrate, (MACROBID) 100 MG capsule; Take 1 capsule (100 mg total) by mouth 2 (two) times daily for 7 days.  Tina Williams, it was a pleasure seeing you today!  I will inform you of your results as soon as they are available.

## 2019-09-09 ENCOUNTER — Ambulatory Visit (INDEPENDENT_AMBULATORY_CARE_PROVIDER_SITE_OTHER): Payer: BC Managed Care – PPO | Admitting: *Deleted

## 2019-09-09 DIAGNOSIS — J309 Allergic rhinitis, unspecified: Secondary | ICD-10-CM | POA: Diagnosis not present

## 2019-09-30 ENCOUNTER — Encounter: Payer: Self-pay | Admitting: Allergy and Immunology

## 2019-09-30 ENCOUNTER — Ambulatory Visit (INDEPENDENT_AMBULATORY_CARE_PROVIDER_SITE_OTHER): Payer: BC Managed Care – PPO | Admitting: Allergy and Immunology

## 2019-09-30 ENCOUNTER — Other Ambulatory Visit: Payer: Self-pay

## 2019-09-30 VITALS — BP 124/76 | HR 74 | Temp 98.2°F | Resp 16 | Ht 61.0 in | Wt 157.8 lb

## 2019-09-30 DIAGNOSIS — J309 Allergic rhinitis, unspecified: Secondary | ICD-10-CM

## 2019-09-30 DIAGNOSIS — T781XXD Other adverse food reactions, not elsewhere classified, subsequent encounter: Secondary | ICD-10-CM | POA: Diagnosis not present

## 2019-09-30 DIAGNOSIS — J3089 Other allergic rhinitis: Secondary | ICD-10-CM | POA: Diagnosis not present

## 2019-09-30 DIAGNOSIS — J4530 Mild persistent asthma, uncomplicated: Secondary | ICD-10-CM

## 2019-09-30 NOTE — Patient Instructions (Addendum)
  1. Continue Immunotherapy until vial is completed  2. Continue Flonase  1-2 sprays each nostril 1-2 times per day during periods of upper airway symptoms  3. Continue Flovent 44 - two inhalations 1-2 times per day during periods of asthma activity.   4. Continue Montelukast 10mg  one tablet one time per day  5. Continue zyrtec, nasal saline, nasal azelastine,  if needed  6. Continue Proair HFA if needed  7. Return in 12 months or earlier if problem  8.  Obtain COVID vaccine

## 2019-09-30 NOTE — Progress Notes (Signed)
[facility] [place] [facility] [place]   Follow-up Note  Referring Provider: Midge Minium, MD Primary Provider: Midge Minium, MD Date of Office Visit: 09/30/2019  Subjective:   Tina Williams (DOB: 03/05/1961) is a 58 y.o. female who returns to the [facility] on 09/30/2019 in re-evaluation of the following:  HPI: Zoee returns to this clinic in evaluation of asthma and allergic rhinitis and oral pollinosis syndrome.  Her last visit to this clinic was 01 April 2019.  She has really done well with her airway and has not required a systemic steroid or antibiotic to treat any type of airway issue.  Rarely does she use a short acting bronchodilator and she can exert herself without any problem.  She has tapered off her Flonase and Flovent this winter and has not found the need to restart these medications at this point.  She does continue to use montelukast on a consistent basis.  Her oral allergy syndrome is much improved ever since she started immunotherapy.  However, during the spring she is very careful about eating fruit before she does get some oral itchiness.  She will take a Zyrtec if she knows he is going to be eating fruits.  Otherwise, during the winter she can eat fruit with no problem.  Her immunotherapy is currently every 3 weeks and she is entering into her fifth year of immunotherapy.  She is now consuming a grain free diet.  Allergies as of 09/30/2019      Reactions   Latex Other (See Comments)   Throat swelling, chest tightness   Amoxicillin Hives   Clarithromycin    REACTION: hives   Naproxen Sodium    REACTION: HIVES   Penicillins    REACTION: hives   Sulfa Antibiotics Hives      Medication List    alendronate 70 MG tablet Commonly known as: FOSAMAX Take 1 tablet (70 mg total) by mouth every 7 (seven) days. Take with a full glass of water on an empty stomach.   DULoxetine 60 MG capsule Commonly known as:  CYMBALTA TAKE 1 CAPSULE BY MOUTH EVERY DAY   EPINEPHrine 0.3 mg/0.3 mL Soaj injection Commonly known as: EpiPen 2-Pak Use as directed for life-threatening allergic reactions   FIBER PO Take 2 tablets by mouth daily as needed.   flecainide 50 MG tablet Commonly known as: TAMBOCOR TAKE 1 TABLET BY MOUTH TWICE A DAY   IMMUNE FORMULA PO Take 1 tablet by mouth daily.   montelukast 10 MG tablet Commonly known as: SINGULAIR Take one tablet by mouth once daily as directed.   ProAir HFA 108 (90 Base) MCG/ACT inhaler Generic drug: albuterol Inhale into the lungs every 6 (six) hours as needed for wheezing or shortness of breath.   Vitamin B-12 1000 MCG Subl Place 1 tablet under the tongue daily.   ZINC PO Take by mouth daily.   ZyrTEC Allergy 10 MG tablet Generic drug: cetirizine Take 10 mg by mouth daily.       Past Medical History:  Diagnosis Date  . Abdominal pain, epigastric 05/13/2014  . Allergic rhinoconjunctivitis 09/27/2017  . Allergy   . Asthma   . ASTHMA 10/09/2008   HFA 75% p coaching  09/27/10    . Depression   . Early menopause   . Fatigue 11/27/2017  . FOOT PAIN, BILATERAL 10/28/2009   Qualifier: Diagnosis of  By: Birdie Riddle MD, Belenda Cruise    . General medical examination 03/23/2011  .  H/O oralPollinosis 03/04/2015  . Hx of adenomatous polyp of colon 01/11/2016  . Hyperlipidemia   . MENOPAUSE, EARLY 07/09/2008   Qualifier: Diagnosis of  By: Ronnald Ramp CMA, Chemira    . Mild persistent asthma without complication [id number]  . Moderate recurrent major depression ([place]) 01/06/2014  . MYALGIA 11/24/2009   Qualifier: Diagnosis of  By: Birdie Riddle MD, Belenda Cruise    . Non-allergic rhinitis   . Obesity   . OBESITY 07/09/2008   Qualifier: Diagnosis of  By: Birdie Riddle MD, Belenda Cruise    . Oral allergy syndrome, subsequent encounter 03/04/2015  . Osteoarthritis   . Osteoporosis 07/2018   T score -2.9  . Personal history of colonic adenoma  11/26/2009  . Polyarthralgia 10/25/2012  . Respiratory tract  infection 03/04/2015  . RHINITIS 10/09/2008   Qualifier: Diagnosis of  By: Birdie Riddle MD, Belenda Cruise    . Sinusitis acute   . Tinnitus of right ear 09/27/2017  . URI (upper respiratory infection)   . Vertigo 09/27/2017  . Vitamin D deficiency 10/28/2016    Past Surgical History:  Procedure Laterality Date  . CESAREAN SECTION  1988  . KNEE ARTHROPLASTY    . KNEE SURGERY Right 1998  . WISDOM TOOTH EXTRACTION  1984    Review of systems negative except as noted in HPI / PMHx or noted below:  Review of Systems  Constitutional: Negative.   HENT: Negative.   Eyes: Negative.   Respiratory: Negative.   Cardiovascular: Negative.   Gastrointestinal: Negative.   Genitourinary: Negative.   Musculoskeletal: Negative.   Skin: Negative.   Neurological: Negative.   Endo/Heme/Allergies: Negative.   Psychiatric/Behavioral: Negative.      Objective:   Vitals:   09/30/19 1102  BP: 124/76  Pulse: 74  Resp: 16  Temp: 98.2 F (36.8 C)  SpO2: 97%   Height: 5\' 1"  (154.9 cm)  Weight: 157 lb 12.8 oz (71.6 kg)   Physical Exam Constitutional:      Appearance: She is not diaphoretic.  HENT:     Head: Normocephalic.     Right Ear: Tympanic membrane, ear canal and external ear normal.     Left Ear: Tympanic membrane, ear canal and external ear normal.     Nose: Nose normal. No mucosal edema or rhinorrhea.     Mouth/Throat:     Pharynx: Uvula midline. No oropharyngeal exudate.  Eyes:     Conjunctiva/sclera: Conjunctivae normal.  Neck:     Thyroid: No thyromegaly.     Trachea: Trachea normal. No tracheal tenderness or tracheal deviation.  Cardiovascular:     Rate and Rhythm: Normal rate and regular rhythm.     Heart sounds: Normal heart sounds, S1 normal and S2 normal. No murmur.  Pulmonary:     Effort: No respiratory distress.     Breath sounds: Normal breath sounds. No stridor. No wheezing or rales.  Lymphadenopathy:     Head:     Right side of head: No tonsillar adenopathy.     Left side  of head: No tonsillar adenopathy.     Cervical: No cervical adenopathy.  Skin:    Findings: No erythema or rash.     Nails: There is no clubbing.  Neurological:     Mental Status: She is alert.     Diagnostics:    Spirometry was performed and demonstrated an FEV1 of 2.51 at 108 % of predicted.  Assessment and Plan:   1. Asthma, well controlled, mild persistent   2. Perennial allergic rhinitis   3.  Oral allergy syndrome, subsequent encounter      1. Continue Immunotherapy until current vial is completed  2. Continue Flonase  1-2 sprays each nostril 1-2 times per day during periods of upper airway symptoms  3. Continue Flovent 44 - two inhalations 1-2 times per day during periods of asthma activity.   4. Continue Montelukast 10mg  one tablet one time per day  5. Continue zyrtec, nasal saline, nasal azelastine,  if needed  6. Continue Proair HFA if needed  7. Return in 12 months or earlier if problem  8.  Obtain COVID vaccine  Brandie will finish up her current immunotherapy extract which would give her approximately 5 years of therapy and it is obvious that this therapy has resulted in rather dramatic improvement regarding both her airway atopic disease and her oral allergy syndrome.  She can always restart Flonase and Flovent should they be required as she moves through each season of the year.  Assuming she does well with this plan I will see her back in this clinic in 1 year or earlier if there is a problem.  Allena Katz, MD [facility] / [facility] [place]

## 2019-10-01 ENCOUNTER — Encounter: Payer: Self-pay | Admitting: Allergy and Immunology

## 2019-10-01 ENCOUNTER — Encounter: Payer: BC Managed Care – PPO | Admitting: Family Medicine

## 2019-10-03 ENCOUNTER — Encounter: Payer: Self-pay | Admitting: Obstetrics and Gynecology

## 2019-10-03 ENCOUNTER — Other Ambulatory Visit: Payer: Self-pay

## 2019-10-03 ENCOUNTER — Ambulatory Visit (INDEPENDENT_AMBULATORY_CARE_PROVIDER_SITE_OTHER): Payer: BC Managed Care – PPO | Admitting: Obstetrics and Gynecology

## 2019-10-03 VITALS — BP 118/76 | Ht 61.0 in | Wt 154.0 lb

## 2019-10-03 DIAGNOSIS — Z01419 Encounter for gynecological examination (general) (routine) without abnormal findings: Secondary | ICD-10-CM | POA: Diagnosis not present

## 2019-10-03 DIAGNOSIS — R82998 Other abnormal findings in urine: Secondary | ICD-10-CM | POA: Diagnosis not present

## 2019-10-03 DIAGNOSIS — M810 Age-related osteoporosis without current pathological fracture: Secondary | ICD-10-CM | POA: Diagnosis not present

## 2019-10-03 DIAGNOSIS — R3989 Other symptoms and signs involving the genitourinary system: Secondary | ICD-10-CM

## 2019-10-03 NOTE — Progress Notes (Signed)
Tina Williams [birth year] [id number]  SUBJECTIVE:  59 y.o. [id number] female for annual routine gynecologic exam. She has no gynecologic concerns. She is concerned she may have an ongoing UTI.  No frequency, no nocturia. Bladder area feels sore in the morning upon waking up, then feels better after emptying bladder.  She was given an rx for Macrobid for empiric UTI coverage last month, which she only took 3 days of since the culture returned negative, and did not feel any differently after the antibiotic.  Current Outpatient Medications  Medication Sig Dispense Refill  . albuterol (PROAIR HFA) 108 (90 Base) MCG/ACT inhaler Inhale into the lungs every 6 (six) hours as needed for wheezing or shortness of breath.    . cetirizine (ZYRTEC ALLERGY) 10 MG tablet Take 10 mg by mouth daily.    . DULoxetine (CYMBALTA) 60 MG capsule TAKE 1 CAPSULE BY MOUTH EVERY DAY 90 capsule 2  . EPINEPHrine (EPIPEN 2-PAK) 0.3 mg/0.3 mL IJ SOAJ injection Use as directed for life-threatening allergic reactions 2 each 3  . flecainide (TAMBOCOR) 50 MG tablet TAKE 1 TABLET BY MOUTH TWICE A DAY 180 tablet 1  . montelukast (SINGULAIR) 10 MG tablet Take one tablet by mouth once daily as directed. 90 tablet 1  . alendronate (FOSAMAX) 70 MG tablet Take 1 tablet (70 mg total) by mouth every 7 (seven) days. Take with a full glass of water on an empty stomach. (Patient not taking: Reported on 10/03/2019) 4 tablet 11   No current facility-administered medications for this visit.   Allergies: Latex, Amoxicillin, Clarithromycin, Naproxen sodium, Penicillins, and Sulfa antibiotics  No LMP recorded. Patient is postmenopausal.  Past medical history,surgical history, problem list, medications, allergies, family history and social history were all reviewed and documented as reviewed in the EPIC chart.  ROS:  Feeling well. No dyspnea or chest pain on exertion.  No abdominal pain, change in bowel habits, black or bloody stools.  No urinary  tract symptoms. GYN ROS: no abnormal bleeding, pelvic pain or discharge, no breast pain or new or enlarging lumps on self exam. No neurological complaints.   OBJECTIVE:  BP 118/76   Ht 5\' 1"  (1.549 m)   Wt 154 lb (69.9 kg)   BMI 29.10 kg/m  The patient appears well, alert, oriented x 3, in no distress. ENT normal.  Neck supple. No cervical or supraclavicular adenopathy or thyromegaly.  Lungs are clear, good air entry, no wheezes, rhonchi or rales. S1 and S2 normal, no murmurs, regular rate and rhythm.  Abdomen soft without tenderness, guarding, mass or organomegaly.  Neurological is normal, no focal findings.  BREAST EXAM: breasts appear normal, no suspicious masses, some fibrous strand changes noted in inferior inner quadrant of right breast, no skin or nipple changes or axillary nodes  PELVIC EXAM: VULVA: normal appearing vulva with no masses, tenderness or lesions, VAGINA: normal appearing vagina with normal color and discharge, no lesions, CERVIX: normal appearing cervix without discharge or lesions, UTERUS: uterus is normal size, shape, consistency and nontender, ADNEXA: normal adnexa in size, nontender and no masses  Chaperone: Caryn Bee present during the examination  ASSESSMENT:  58 y.o. [id number] here for annual gynecologic exam  PLAN:   1. Menopausal.  No bleeding or hormonal concerns. 2. Pap smear 07/2018.  No significant history of abnormal Pap smears.  Next Pap smear due 2025 following the current guidelines recommending the 5 year cotesting interval. 3. Mammogram 04/2018.  Normal breast exam today.  Some possible fibrous changes noted on  her breast exam today.  She is reminded to schedule an annual mammogram this year when due.  Gets annual mammogram through work in October, will plan this year. 4. Colonoscopy 2017.  Recommended that she follow up at the recommended interval.   5. Bladder pain. Discussed possible UTI, Interstitial cystitis, kidney stones. Recently voided so  unable to leave a urine sample today. Will plan to return to leave a urine sample next week, or may just choose to do this when she sees her PCP in a few weeks.  If no sign of calcium stones (as on her previous UA), I recommend going back on alendronate to address her osteoporosis.  Also, may consider a trial of vaginal estrogen if workup is negative and still having the bladder area discomfort. 6. Osteoporosis. DEXA 07/2018 with T-score -2.9 at [facility].  History of early menopause in 33s, no HRT, also with family history of osteoporosis in her younger sister.  She did start alendronate 70 mg weekly is currently holding it pending recheck of her urinalysis for recurrence of calcium oxalate casts. Next DEXA recommended 2022 so we will plan to have her schedule this when it is due. 7. Health maintenance.  No labs today as she normally has these completed with her primary care provider.   Return annually or sooner, prn.  Joseph Pierini MD, FACOG  10/03/19

## 2019-10-14 ENCOUNTER — Other Ambulatory Visit: Payer: Self-pay

## 2019-10-14 ENCOUNTER — Ambulatory Visit (INDEPENDENT_AMBULATORY_CARE_PROVIDER_SITE_OTHER): Payer: BC Managed Care – PPO | Admitting: Family Medicine

## 2019-10-14 ENCOUNTER — Encounter: Payer: Self-pay | Admitting: Family Medicine

## 2019-10-14 VITALS — BP 121/78 | HR 76 | Temp 97.9°F | Resp 16 | Ht 61.0 in | Wt 153.0 lb

## 2019-10-14 DIAGNOSIS — Z0000 Encounter for general adult medical examination without abnormal findings: Secondary | ICD-10-CM

## 2019-10-14 DIAGNOSIS — E785 Hyperlipidemia, unspecified: Secondary | ICD-10-CM

## 2019-10-14 DIAGNOSIS — F331 Major depressive disorder, recurrent, moderate: Secondary | ICD-10-CM | POA: Diagnosis not present

## 2019-10-14 DIAGNOSIS — E559 Vitamin D deficiency, unspecified: Secondary | ICD-10-CM

## 2019-10-14 LAB — LIPID PANEL
Cholesterol: 216 mg/dL — ABNORMAL HIGH (ref 0–200)
HDL: 60.7 mg/dL (ref >39.00)
LDL Cholesterol: 137 mg/dL — ABNORMAL HIGH (ref 0–99)
NonHDL: 155.01
Total CHOL/HDL Ratio: 4
Triglycerides: 91 mg/dL (ref 0.0–149.0)
VLDL: 18.2 mg/dL (ref 0.0–40.0)

## 2019-10-14 LAB — CBC WITH DIFFERENTIAL/PLATELET
Basophils Absolute: 0 10*3/uL (ref 0.0–0.1)
Basophils Relative: 0.8 % (ref 0.0–3.0)
Eosinophils Absolute: 0.2 10*3/uL (ref 0.0–0.7)
Eosinophils Relative: 2.5 % (ref 0.0–5.0)
HCT: 41.1 % (ref 36.0–46.0)
Hemoglobin: 13.4 g/dL (ref 12.0–15.0)
Lymphocytes Relative: 24 % (ref 12.0–46.0)
Lymphs Abs: 1.5 10*3/uL (ref 0.7–4.0)
MCHC: 32.6 g/dL (ref 30.0–36.0)
MCV: 86.5 fl (ref 78.0–100.0)
Monocytes Absolute: 0.5 10*3/uL (ref 0.1–1.0)
Monocytes Relative: 8.4 % (ref 3.0–12.0)
Neutro Abs: 3.9 10*3/uL (ref 1.4–7.7)
Neutrophils Relative %: 64.3 % (ref 43.0–77.0)
Platelets: 429 10*3/uL — ABNORMAL HIGH (ref 150.0–400.0)
RBC: 4.75 Mil/uL (ref 3.87–5.11)
RDW: 15 % (ref 11.5–15.5)
WBC: 6.1 10*3/uL (ref 4.0–10.5)

## 2019-10-14 LAB — HEPATIC FUNCTION PANEL
ALT: 14 U/L (ref 0–35)
AST: 16 U/L (ref 0–37)
Albumin: 4.5 g/dL (ref 3.5–5.2)
Alkaline Phosphatase: 97 U/L (ref 39–117)
Bilirubin, Direct: 0.1 mg/dL (ref 0.0–0.3)
Total Bilirubin: 0.8 mg/dL (ref 0.2–1.2)
Total Protein: 6.8 g/dL (ref 6.0–8.3)

## 2019-10-14 LAB — BASIC METABOLIC PANEL
BUN: 19 mg/dL (ref 6–23)
CO2: 29 mEq/L (ref 19–32)
Calcium: 9.3 mg/dL (ref 8.4–10.5)
Chloride: 101 mEq/L (ref 96–112)
Creatinine, Ser: 0.57 mg/dL (ref 0.40–1.20)
GFR: 108.62 mL/min (ref >60.00)
Glucose, Bld: 89 mg/dL (ref 70–99)
Potassium: 4.7 mEq/L (ref 3.5–5.1)
Sodium: 137 mEq/L (ref 135–145)

## 2019-10-14 LAB — TSH: TSH: 1.14 u[IU]/mL (ref 0.35–4.50)

## 2019-10-14 LAB — VITAMIN D 25 HYDROXY (VIT D DEFICIENCY, FRACTURES): VITD: 61.54 ng/mL (ref 30.00–100.00)

## 2019-10-14 NOTE — Assessment & Plan Note (Signed)
Pt has hx of this.  Check labs and replete prn. 

## 2019-10-14 NOTE — Assessment & Plan Note (Signed)
Pt's PE WNL.  UTD on colonoscopy, pap, DEXA, immunizations.  Due for mammo.  Missed 2020 due to [place].  Will get at work in October.  Check labs.  Anticipatory guidance provided.

## 2019-10-14 NOTE — Progress Notes (Signed)
   Subjective:    Patient ID: Tina Williams, female    DOB: 06-Oct-1960, 59 y.o.   MRN: [id number]  HPI CPE- UTD on pap, colonoscopy, DEXA.  Due for mammo- pt will get thru work in October (skipped 2020 due to [place])   Review of Systems Patient reports no vision/ hearing changes, adenopathy,fever, weight change,  persistant/recurrent hoarseness , swallowing issues, chest pain, palpitations, edema, persistant/recurrent cough, hemoptysis, dyspnea (rest/exertional/paroxysmal nocturnal), gastrointestinal bleeding (melena, rectal bleeding), abdominal pain, significant heartburn, bowel changes, GU symptoms (dysuria, hematuria, incontinence), Gyn symptoms (abnormal  bleeding, pain),  syncope, focal weakness, memory loss, numbness & tingling, skin/hair/nail changes, abnormal bruising or bleeding, anxiety, or depression.   This visit occurred during the SARS-CoV-2 public health emergency.  Safety protocols were in place, including screening questions prior to the visit, additional usage of staff PPE, and extensive cleaning of exam room while observing appropriate contact time as indicated for disinfecting solutions.       Objective:   Physical Exam General Appearance:    Alert, cooperative, no distress, appears stated age  Head:    Normocephalic, without obvious abnormality, atraumatic  Eyes:    PERRL, conjunctiva/corneas clear, EOM's intact, fundi    benign, both eyes  Ears:    Normal TM's and external ear canals, both ears  Nose:   Deferred due to COVID  Throat:   Neck:   Supple, symmetrical, trachea midline, no adenopathy;    Thyroid: no enlargement/tenderness/nodules  Back:     Symmetric, no curvature, ROM normal, no CVA tenderness  Lungs:     Clear to auscultation bilaterally, respirations unlabored  Chest Wall:    No tenderness or deformity   Heart:    Regular rate and rhythm, S1 and S2 normal, no murmur, rub   or gallop  Breast Exam:    Deferred to GYN  Abdomen:     Soft, non-tender, bowel  sounds active all four quadrants,    no masses, no organomegaly  Genitalia:    Deferred to GYN  Rectal:    Extremities:   Extremities normal, atraumatic, no cyanosis or edema  Pulses:   2+ and symmetric all extremities  Skin:   Skin color, texture, turgor normal, no rashes or lesions  Lymph nodes:   Cervical, supraclavicular, and axillary nodes normal  Neurologic:   CNII-XII intact, normal strength, sensation and reflexes    throughout          Assessment & Plan:

## 2019-10-14 NOTE — Assessment & Plan Note (Signed)
Chronic problem.  Currently well controlled.  Will follow.

## 2019-10-14 NOTE — Patient Instructions (Addendum)
Follow up in 1 year or as needed We'll notify you of your lab results and make any changes if needed Continue to work on healthy diet and regular exercise- you can do it! Call with any questions or concerns Stay Safe!  Stay Healthy! Happy Spring!!! 

## 2019-10-14 NOTE — Assessment & Plan Note (Signed)
Ongoing issue.  Has been attempting to control w/ diet and exercise.  Check labs and start meds prn.

## 2019-10-21 ENCOUNTER — Ambulatory Visit (INDEPENDENT_AMBULATORY_CARE_PROVIDER_SITE_OTHER): Payer: BC Managed Care – PPO | Admitting: *Deleted

## 2019-10-21 DIAGNOSIS — J309 Allergic rhinitis, unspecified: Secondary | ICD-10-CM | POA: Diagnosis not present

## 2019-10-24 ENCOUNTER — Other Ambulatory Visit: Payer: Self-pay | Admitting: Cardiology

## 2019-10-28 ENCOUNTER — Other Ambulatory Visit: Payer: Self-pay | Admitting: Allergy and Immunology

## 2019-10-29 ENCOUNTER — Encounter: Payer: Self-pay | Admitting: Obstetrics and Gynecology

## 2019-11-18 ENCOUNTER — Ambulatory Visit (INDEPENDENT_AMBULATORY_CARE_PROVIDER_SITE_OTHER): Payer: BC Managed Care – PPO | Admitting: *Deleted

## 2019-11-18 DIAGNOSIS — J309 Allergic rhinitis, unspecified: Secondary | ICD-10-CM | POA: Diagnosis not present

## 2019-11-18 NOTE — Progress Notes (Signed)
This will be Tina Williams's last injection, she has signed the "discontinue therapy" form and I have faxed the form to [place].

## 2020-01-04 NOTE — Progress Notes (Signed)
Cardiology Office Note:    Date:  01/06/2020   ID:  Tina Williams, DOB 1961-03-29, MRN [id number]  PCP:  Midge Minium, MD  Cardiologist:  Shirlee More, MD    Referring MD: Midge Minium, MD    ASSESSMENT:    1. Chest pain of uncertain etiology   2. APC (atrial premature contractions)   3. High risk medication use    PLAN:    In order of problems listed above:  1. She is having chest pain that is nonanginal and previous normal myocardial perfusion study advised to be scheduled and will have cardiac CTA performed. 2. Her arrhythmia is under good control and option be as needed flecainide she is just not ready for that at this time will read readdress at the next visit and continue her current antiarrhythmic drug.  If she has severe CAD it will need to be discontinued   Next appointment: 6 months   Medication Adjustments/Labs and Tests Ordered: Current medicines are reviewed at length with the patient today.  Concerns regarding medicines are outlined above.  No orders of the defined types were placed in this encounter.  No orders of the defined types were placed in this encounter.   Chief Complaint  Patient presents with  . Follow-up    She is on flecainide with frequent APCs  . Palpitations    History of Present Illness:    Tina Williams is a 59 y.o. female with a hx of palpitation with frequent APC's on flecanide  last seen 05/08/2019.  Previous evaluation June 2019 showed normal left ventricular function no evidence of cardiomyopathy and a myocardial perfusion study showed EF of 60% and no scintigraphic or EKG findings of ischemia.  Her 14-day ambulatory heart rhythm monitor showed 2.4% burden of APCs and symptomatic events were associated with these.  A beta-blocker was ineffective and she was placed on low-dose flecainide for symptom relief.  Compliance with diet, lifestyle and medications: Yes  She had a good result with flecainide she has no palpitation  we discussed taking it as needed but she would like to stay on the medication at least until her next visit.  She has had a couple episodes of nonexertional brief chest pain less than a minute not severe located in the left precordium and she just continued her activity she does do lifting at work but does not find herself tender and is not tender on physical examination.  It was not pleuritic there is no radiation no shortness of breath diaphoresis or nausea.  We discussed further evaluation and after review of her history previous normal myocardial perfusion study and ongoing symptoms should undergo cardiac CTA.  She has normal renal function is not intolerant of contrast Past Medical History:  Diagnosis Date  . Abdominal pain, epigastric 05/13/2014  . Allergic rhinoconjunctivitis 09/27/2017  . Allergy   . Asthma   . ASTHMA 10/09/2008   HFA 75% p coaching  09/27/10    . Depression   . Early menopause   . Fatigue 11/27/2017  . FOOT PAIN, BILATERAL 10/28/2009   Qualifier: Diagnosis of  By: Birdie Riddle MD, Belenda Cruise    . General medical examination 03/23/2011  . H/O oralPollinosis 03/04/2015  . Hx of adenomatous polyp of colon 01/11/2016  . Hyperlipidemia   . MENOPAUSE, EARLY 07/09/2008   Qualifier: Diagnosis of  By: Ronnald Ramp CMA, Chemira    . Mild persistent asthma without complication 6/0/1093  . Moderate recurrent major depression ([place]) 01/06/2014  . MYALGIA 11/24/2009  Qualifier: Diagnosis of  By: Birdie Riddle MD, Belenda Cruise    . Non-allergic rhinitis   . Obesity   . OBESITY 07/09/2008   Qualifier: Diagnosis of  By: Birdie Riddle MD, Belenda Cruise    . Oral allergy syndrome, subsequent encounter 03/04/2015  . Osteoarthritis   . Osteoporosis 07/2018   T score -2.9  . Personal history of colonic adenoma  11/26/2009  . Polyarthralgia 10/25/2012  . Respiratory tract infection 03/04/2015  . RHINITIS 10/09/2008   Qualifier: Diagnosis of  By: Birdie Riddle MD, Belenda Cruise    . Sinusitis acute   . Tinnitus of right ear 09/27/2017  . URI (upper  respiratory infection)   . Vertigo 09/27/2017  . Vitamin D deficiency 10/28/2016    Past Surgical History:  Procedure Laterality Date  . CESAREAN SECTION  1988  . KNEE ARTHROPLASTY    . KNEE SURGERY Right 1998  . WISDOM TOOTH EXTRACTION  1984    Current Medications: Current Meds  Medication Sig  . albuterol (PROAIR HFA) 108 (90 Base) MCG/ACT inhaler Inhale into the lungs every 6 (six) hours as needed for wheezing or shortness of breath.  . cetirizine (ZYRTEC ALLERGY) 10 MG tablet Take 10 mg by mouth daily.  . DULoxetine (CYMBALTA) 60 MG capsule TAKE 1 CAPSULE BY MOUTH EVERY DAY  . EPINEPHrine (EPIPEN 2-PAK) 0.3 mg/0.3 mL IJ SOAJ injection Use as directed for life-threatening allergic reactions  . flecainide (TAMBOCOR) 50 MG tablet Take 1 tablet (50 mg total) by mouth 2 (two) times daily. PLEASE CALL OFFICE TO SCHEDULE APPOINTMENT FOR FURTHER REFILLS.  Marland Kitchen montelukast (SINGULAIR) 10 MG tablet TAKE 1 TABLET BY MOUTH EVERY DAY AS DIRECTED     Allergies:   Latex, Amoxicillin, Clarithromycin, Naproxen sodium, Penicillins, and Sulfa antibiotics   Social History   Socioeconomic History  . Marital status: Married    Spouse name: Not on file  . Number of children: 3  . Years of education: Not on file  . Highest education level: Not on file  Occupational History  . Occupation: Teacher's Aid  Tobacco Use  . Smoking status: Former Smoker    Packs/day: 0.50    Years: 18.00    Pack years: 9.00    Types: Cigarettes    Quit date: 06/28/1999    Years since quitting: 20.5  . Smokeless tobacco: Never Used  Vaping Use  . Vaping Use: Never used  Substance and Sexual Activity  . Alcohol use: Yes    Alcohol/week: 7.0 standard drinks    Types: 7 Glasses of wine per week    Comment: weekly  . Drug use: Never  . Sexual activity: Yes    Birth control/protection: Post-menopausal    Comment: 1st intercourse 59 yo-Fewer than 5 partners  Other Topics Concern  . Not on file  Social History  Narrative  . Not on file   Social Determinants of Health   Financial Resource Strain:   . Difficulty of Paying Living Expenses:   Food Insecurity:   . Worried About Charity fundraiser in the Last Year:   . Arboriculturist in the Last Year:   Transportation Needs:   . Film/video editor (Medical):   Marland Kitchen Lack of Transportation (Non-Medical):   Physical Activity:   . Days of Exercise per Week:   . Minutes of Exercise per Session:   Stress:   . Feeling of Stress :   Social Connections:   . Frequency of Communication with Friends and Family:   . Frequency of Social  Gatherings with Friends and Family:   . Attends Religious Services:   . Active Member of Clubs or Organizations:   . Attends Archivist Meetings:   Marland Kitchen Marital Status:      Family History: The patient's family history includes Colon cancer (age of onset: 65) in her sister; Diabetes in her brother, mother, and sister; Emphysema in her father; Epilepsy in her sister; Healthy in her son, son, and son; Heart Problems in her son; Hypertension in her brother and mother; Lung cancer in her sister. ROS:   Please see the history of present illness.    All other systems reviewed and are negative.  EKGs/Labs/Other Studies Reviewed:    The following studies were reviewed today:  EKG:  EKG ordered today and personally reviewed.  The ekg ordered today demonstrates sinus rhythm and is normal no APCs  Recent Labs: 10/14/2019: ALT 14; BUN 19; Creatinine, Ser 0.57; Hemoglobin 13.4; Platelets 429.0; Potassium 4.7; Sodium 137; TSH 1.14  Recent Lipid Panel    Component Value Date/Time   CHOL 216 (H) 10/14/2019 1044   TRIG 91.0 10/14/2019 1044   HDL 60.70 10/14/2019 1044   CHOLHDL 4 10/14/2019 1044   VLDL 18.2 10/14/2019 1044   LDLCALC 137 (H) 10/14/2019 1044   LDLDIRECT 161.6 03/23/2011 1331    Physical Exam:    VS:  BP 110/66   Pulse 69   Ht 5\' 1"  (1.549 m)   Wt 146 lb 1.9 oz (66.3 kg)   SpO2 97%   BMI 27.61  kg/m     Wt Readings from Last 3 Encounters:  01/06/20 146 lb 1.9 oz (66.3 kg)  10/14/19 153 lb (69.4 kg)  10/03/19 154 lb (69.9 kg)     GEN:  Well nourished, well developed in no acute distress HEENT: Normal NECK: No JVD; No carotid bruits LYMPHATICS: No lymphadenopathy CARDIAC: She has no chest wall tenderness RRR, no murmurs, rubs, gallops RESPIRATORY:  Clear to auscultation without rales, wheezing or rhonchi  ABDOMEN: Soft, non-tender, non-distended MUSCULOSKELETAL:  No edema; No deformity  SKIN: Warm and dry NEUROLOGIC:  Alert and oriented x 3 PSYCHIATRIC:  Normal affect    Signed, Shirlee More, MD  01/06/2020 8:46 AM    [place]

## 2020-01-06 ENCOUNTER — Ambulatory Visit (INDEPENDENT_AMBULATORY_CARE_PROVIDER_SITE_OTHER): Payer: BC Managed Care – PPO | Admitting: Cardiology

## 2020-01-06 ENCOUNTER — Encounter: Payer: Self-pay | Admitting: Cardiology

## 2020-01-06 ENCOUNTER — Other Ambulatory Visit: Payer: Self-pay

## 2020-01-06 VITALS — BP 110/66 | HR 69 | Ht 61.0 in | Wt 146.1 lb

## 2020-01-06 DIAGNOSIS — R079 Chest pain, unspecified: Secondary | ICD-10-CM

## 2020-01-06 DIAGNOSIS — I491 Atrial premature depolarization: Secondary | ICD-10-CM

## 2020-01-06 DIAGNOSIS — Z79899 Other long term (current) drug therapy: Secondary | ICD-10-CM

## 2020-01-06 MED ORDER — METOPROLOL TARTRATE 100 MG PO TABS
100.0000 mg | ORAL_TABLET | Freq: Once | ORAL | 0 refills | Status: DC
Start: 2020-01-06 — End: 2021-03-22

## 2020-01-06 NOTE — Patient Instructions (Addendum)
Medication Instructions:  Your physician recommends that you continue on your current medications as directed. Please refer to the Current Medication list given to you today.  *If you need a refill on your cardiac medications before your next appointment, please call your pharmacy*   Lab Work: Your physician recommends that you return for lab work in: Within one week of your cardiac CT BMP If you have labs (blood work) drawn today and your tests are completely normal, you will receive your results only by: Marland Kitchen MyChart Message (if you have MyChart) OR . A paper copy in the mail If you have any lab test that is abnormal or we need to change your treatment, we will call you to review the results.   Testing/Procedures: Your cardiac CT will be scheduled at the below location:   [facility] [address] [place], [place] [address] [phone]  If scheduled at [facility], please arrive at the [facility] main entrance of [facility] 30 minutes prior to test start time. Proceed to the [facility] Radiology Department (first floor) to check-in and test prep.   Please follow these instructions carefully (unless otherwise directed):  On the Night Before the Test: . Be sure to Drink plenty of water. . Do not consume any caffeinated/decaffeinated beverages or chocolate 12 hours prior to your test. . Do not take any antihistamines 12 hours prior to your test.  On the Day of the Test: . Drink plenty of water. Do not drink any water within one hour of the test. . Do not eat any food 4 hours prior to the test. . You may take your regular medications prior to the test.  . Take metoprolol (Lopressor) two hours prior to test. . FEMALES- please wear underwire-free bra if available       After the Test: . Drink plenty of water. . After receiving IV contrast, you may experience a mild flushed feeling. This is normal. . On occasion, you may experience a mild rash up  to 24 hours after the test. This is not dangerous. If this occurs, you can take Benadryl 25 mg and increase your fluid intake. . If you experience trouble breathing, this can be serious. If it is severe call 911 IMMEDIATELY. If it is mild, please call our office. . If you take any of these medications: Glipizide/Metformin, Avandament, Glucavance, please do not take 48 hours after completing test unless otherwise instructed.   Once we have confirmed authorization from your insurance company, we will call you to set up a date and time for your test. Based on how quickly your insurance processes prior authorizations requests, please allow up to 4 weeks to be contacted for scheduling your Cardiac CT appointment. Be advised that routine Cardiac CT appointments could be scheduled as many as 8 weeks after your provider has ordered it.  For non-scheduling related questions, please contact the cardiac imaging nurse navigator should you have any questions/concerns: Marchia Bond, Cardiac Imaging Nurse Navigator Burley Saver, Interim Cardiac Imaging Nurse [place] [facility] Direct Office Dial: [phone]   For scheduling needs, including cancellations and rescheduling, please call Vivien Rota at [phone], option 3.      Follow-Up: At [facility], you and your health needs are our priority.  As part of our continuing mission to provide you with exceptional heart care, we have created designated Provider Care Teams.  These Care Teams include your primary Cardiologist (physician) and Advanced Practice Providers (APPs -  Physician Assistants and Nurse Practitioners) who all work together to provide you with the care you need, when you need it.  We recommend signing up for the patient portal called "MyChart".  Sign up information is provided on this After Visit Summary.  MyChart is used to connect with patients for Virtual Visits (Telemedicine).  Patients are able to view lab/test  results, encounter notes, upcoming appointments, etc.  Non-urgent messages can be sent to your provider as well.   To learn more about what you can do with MyChart, go to [email or web address].    Your next appointment:   6 month(s)  The format for your next appointment:   In Person  Provider:   Shirlee More, MD   Other Instructions

## 2020-01-27 ENCOUNTER — Other Ambulatory Visit: Payer: Self-pay | Admitting: Family Medicine

## 2020-02-14 DIAGNOSIS — Z20822 Contact with and (suspected) exposure to covid-19: Secondary | ICD-10-CM | POA: Diagnosis not present

## 2020-02-14 DIAGNOSIS — J019 Acute sinusitis, unspecified: Secondary | ICD-10-CM | POA: Diagnosis not present

## 2020-02-17 ENCOUNTER — Telehealth (HOSPITAL_COMMUNITY): Payer: Self-pay | Admitting: Emergency Medicine

## 2020-02-17 NOTE — Telephone Encounter (Signed)
Reaching out to patient to offer assistance regarding upcoming cardiac imaging study; pt verbalizes understanding of appt date/time, parking situation and where to check in, pre-test NPO status and medications ordered, and verified current allergies; name and call back number provided for further questions should they arise Sheril Hammond RN Navigator Cardiac Imaging [facility] [phone] office [phone] cell 

## 2020-02-19 ENCOUNTER — Other Ambulatory Visit: Payer: Self-pay

## 2020-02-19 ENCOUNTER — Ambulatory Visit (HOSPITAL_COMMUNITY)
Admission: RE | Admit: 2020-02-19 | Discharge: 2020-02-19 | Disposition: A | Discharge status: Home / Self Care | Payer: BC Managed Care – PPO | Source: Ambulatory Visit | Attending: Cardiology | Admitting: Cardiology

## 2020-02-19 ENCOUNTER — Telehealth: Payer: Self-pay

## 2020-02-19 DIAGNOSIS — R079 Chest pain, unspecified: Secondary | ICD-10-CM | POA: Diagnosis not present

## 2020-02-19 MED ORDER — NITROGLYCERIN 0.4 MG SL SUBL
SUBLINGUAL_TABLET | SUBLINGUAL | Status: AC
  Filled 2020-02-19: qty 2

## 2020-02-19 MED ORDER — NITROGLYCERIN 0.4 MG SL SUBL
0.8000 mg | SUBLINGUAL_TABLET | SUBLINGUAL | Status: DC | PRN
  Administered 2020-02-19: 0.8 mg via SUBLINGUAL

## 2020-02-19 MED ORDER — IOHEXOL 350 MG/ML SOLN
80.0000 mL | Freq: Once | INTRAVENOUS | Status: AC | PRN
  Administered 2020-02-19: 80 mL via INTRAVENOUS

## 2020-02-19 NOTE — Telephone Encounter (Signed)
Spoke with patient regarding results and recommendation.  Patient verbalizes understanding and is agreeable to plan of care. Advised patient to call back with any issues or concerns.  

## 2020-02-19 NOTE — Telephone Encounter (Signed)
Patient is returning Morgan's call regarding Cardiac CT results. Please call.

## 2020-02-19 NOTE — Telephone Encounter (Signed)
Left message on patients voicemail to please return our call.   

## 2020-02-19 NOTE — Telephone Encounter (Signed)
-----   Message from Richardo Priest, MD sent at 02/19/2020  2:03 PM EDT ----- This is a good results normal coronary arteries she can continue to take flecainide.

## 2020-03-11 ENCOUNTER — Other Ambulatory Visit: Payer: Self-pay

## 2020-03-11 MED ORDER — FLECAINIDE ACETATE 50 MG PO TABS: 50.0000 mg | ORAL_TABLET | Freq: Two times a day (BID) | ORAL | 2 refills | Status: DC

## 2020-03-11 NOTE — Telephone Encounter (Signed)
Refill sent to [facility] for Flecainide.

## 2020-03-30 ENCOUNTER — Ambulatory Visit (INDEPENDENT_AMBULATORY_CARE_PROVIDER_SITE_OTHER): Payer: BC Managed Care – PPO | Admitting: Family Medicine

## 2020-03-30 ENCOUNTER — Encounter: Payer: Self-pay | Admitting: Family Medicine

## 2020-03-30 ENCOUNTER — Other Ambulatory Visit: Payer: Self-pay

## 2020-03-30 ENCOUNTER — Ambulatory Visit (INDEPENDENT_AMBULATORY_CARE_PROVIDER_SITE_OTHER)
Admission: RE | Admit: 2020-03-30 | Discharge: 2020-03-30 | Disposition: A | Discharge status: Home / Self Care | Payer: BC Managed Care – PPO | Source: Ambulatory Visit | Attending: Family Medicine | Admitting: Family Medicine

## 2020-03-30 VITALS — BP 112/72 | HR 80 | Temp 97.9°F | Resp 16 | Ht 61.0 in | Wt 143.0 lb

## 2020-03-30 DIAGNOSIS — R0781 Pleurodynia: Secondary | ICD-10-CM | POA: Diagnosis not present

## 2020-03-30 DIAGNOSIS — S2232XA Fracture of one rib, left side, initial encounter for closed fracture: Secondary | ICD-10-CM | POA: Diagnosis not present

## 2020-03-30 MED ORDER — PREDNISONE 10 MG PO TABS: ORAL_TABLET | ORAL | 0 refills | Status: DC

## 2020-03-30 MED ORDER — CYCLOBENZAPRINE HCL 10 MG PO TABS: 10.0000 mg | ORAL_TABLET | Freq: Three times a day (TID) | ORAL | 0 refills | Status: DC | PRN

## 2020-03-30 NOTE — Progress Notes (Signed)
   Subjective:    Patient ID: Tina Williams, female    DOB: [birth year], 59 y.o.   MRN: [id number]  HPI Rib pain- first started ~1 week ago, 'and it's not getting any better'.  Was exercising and rolled over when she still had a resistance band attached to her foot.  Felt a pop.  L sided.  Has been taking Tylenol and Advil.  Some relief w/ Advil.  TTP, no overlying bruising.  Painful to move- particularly in bed.  Very stiff in AM.   Review of Systems For ROS see HPI   This visit occurred during the SARS-CoV-2 public health emergency.  Safety protocols were in place, including screening questions prior to the visit, additional usage of staff PPE, and extensive cleaning of exam room while observing appropriate contact time as indicated for disinfecting solutions.       Objective:   Physical Exam Vitals reviewed.  Constitutional:      General: She is not in acute distress.    Appearance: Normal appearance. She is not ill-appearing.     Comments: Uncomfortable appearing  HENT:     Head: Normocephalic and atraumatic.  Pulmonary:     Effort: Pulmonary effort is normal.     Breath sounds: Normal breath sounds.  Chest:     Chest wall: Tenderness (no TTP over R side, + TTP over L lower anterolateral ribs, no obvious stepoff or deformity) present.  Neurological:     General: No focal deficit present.     Mental Status: She is alert and oriented to person, place, and time.  Psychiatric:        Mood and Affect: Mood normal.        Behavior: Behavior normal.        Thought Content: Thought content normal.           Assessment & Plan:  Rib pain- new.  Suspect costochondritis or other chondral/muscular etiology of pain as the mechanism would not be consistent w/ rib fracture.  But will get xray to assess.  Start prednisone taper, muscle relaxer prn.  Reviewed supportive care and red flags that should prompt return.  Pt expressed understanding and is in agreement w/ plan.

## 2020-03-30 NOTE — Patient Instructions (Signed)
Follow up as needed or as scheduled Go to [place] and get your xray done on the lower level- no appt needed START the Prednisone as directed- take w/ food Use the Cyclobenzaprine as needed for muscle spasm- may cause drowsiness ICE Call with any questions or concerns Hang in there!

## 2020-03-31 ENCOUNTER — Telehealth: Payer: Self-pay | Admitting: Family Medicine

## 2020-03-31 ENCOUNTER — Encounter: Payer: Self-pay | Admitting: Family Medicine

## 2020-03-31 NOTE — Telephone Encounter (Signed)
Will give paperwork to PCP

## 2020-03-31 NOTE — Telephone Encounter (Signed)
FMLA forms placed in Dr. Virgil Benedict bin up front

## 2020-03-31 NOTE — Telephone Encounter (Signed)
Form completed and placed in basket  

## 2020-03-31 NOTE — Telephone Encounter (Signed)
FYI

## 2020-04-01 ENCOUNTER — Encounter: Payer: Self-pay | Admitting: Obstetrics and Gynecology

## 2020-04-01 NOTE — Telephone Encounter (Signed)
Faxed form to [phone]

## 2020-04-01 NOTE — Telephone Encounter (Signed)
I would recommend discussing metabolic findings with her primary doctor, an internist or endocrinology in terms of what to try for bone health management.

## 2020-04-06 ENCOUNTER — Telehealth: Payer: Self-pay | Admitting: Family Medicine

## 2020-04-06 NOTE — Telephone Encounter (Signed)
Patient would like to know if she can have another week off work due to her cracked rib.  She states that it is still painful.  Please advise

## 2020-04-06 NOTE — Telephone Encounter (Signed)
Responded to pt directly via MyChart

## 2020-04-06 NOTE — Telephone Encounter (Signed)
Please advise 

## 2020-04-07 NOTE — Telephone Encounter (Signed)
Please advise 

## 2020-04-07 NOTE — Telephone Encounter (Signed)
Note was sent via MyChart

## 2020-04-07 NOTE — Telephone Encounter (Signed)
Patient called back and states that she received Dr. Virgil Benedict message concerning medical leave.  Patient states that she talked to her HR person and they just need a note on our letter stating her new return to work date  - fax:  Samella Parr  [phone]

## 2020-04-16 ENCOUNTER — Telehealth: Payer: Self-pay | Admitting: Family Medicine

## 2020-04-16 NOTE — Telephone Encounter (Signed)
Patient called stating that because of her broken ribs she needs a note going back out of work.  She is not able to perform her normal job duties - please advise

## 2020-04-17 ENCOUNTER — Encounter: Payer: Self-pay | Admitting: Family Medicine

## 2020-04-17 NOTE — Telephone Encounter (Signed)
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 She states she is unable to keep up with the flow because she works with a team. Patient states she is having to take a lot of Advil which is constipating her. Patient states she thinks it is best for her not to work until she is completely healed. She states she has already been approved for [employer] and disability. She has spoken with her HR department and all she needs is a note and she will be able to go back out.

## 2020-04-17 NOTE — Telephone Encounter (Signed)
Called patient but she was unable to hear me. Tried calling back and no answer.

## 2020-04-17 NOTE — Telephone Encounter (Signed)
Please advise 

## 2020-04-17 NOTE — Telephone Encounter (Signed)
Patient notified

## 2020-04-17 NOTE — Telephone Encounter (Signed)
Letter was sent via MyChart

## 2020-04-17 NOTE — Telephone Encounter (Signed)
She has been out of work since 10/4.  At this point, I need to know what specifically she is not able to do b/c typically by this time people are able to return to work.

## 2020-04-20 ENCOUNTER — Telehealth: Payer: Self-pay | Admitting: Family Medicine

## 2020-04-20 NOTE — Telephone Encounter (Signed)
Placed form in Dr. Virgil Benedict bin up front - sa

## 2020-04-20 NOTE — Telephone Encounter (Signed)
Paperwork given to PCP.  

## 2020-04-28 ENCOUNTER — Telehealth: Payer: Self-pay | Admitting: Family Medicine

## 2020-04-28 NOTE — Telephone Encounter (Signed)
Leave of absence form placed in Dr. Virgil Benedict bin up front - sa

## 2020-04-29 NOTE — Telephone Encounter (Signed)
Paperwork given to PCP.  

## 2020-04-30 ENCOUNTER — Other Ambulatory Visit: Payer: Self-pay | Admitting: Allergy and Immunology

## 2020-05-01 ENCOUNTER — Encounter: Payer: Self-pay | Admitting: Family Medicine

## 2020-05-01 DIAGNOSIS — Z0279 Encounter for issue of other medical certificate: Secondary | ICD-10-CM

## 2020-05-01 NOTE — Telephone Encounter (Signed)
Duplicate note.  Forms done

## 2020-05-01 NOTE — Telephone Encounter (Signed)
Made pt aware of this as well.

## 2020-05-01 NOTE — Telephone Encounter (Signed)
Form completed and placed in basket  

## 2020-05-01 NOTE — Telephone Encounter (Signed)
Picked up from the back faxed to [phone] and sent to scan.

## 2020-05-29 DIAGNOSIS — E28319 Asymptomatic premature menopause: Secondary | ICD-10-CM | POA: Insufficient documentation

## 2020-05-29 DIAGNOSIS — E669 Obesity, unspecified: Secondary | ICD-10-CM | POA: Insufficient documentation

## 2020-05-29 DIAGNOSIS — F32A Depression, unspecified: Secondary | ICD-10-CM | POA: Insufficient documentation

## 2020-05-29 DIAGNOSIS — J069 Acute upper respiratory infection, unspecified: Secondary | ICD-10-CM | POA: Insufficient documentation

## 2020-05-29 DIAGNOSIS — J310 Chronic rhinitis: Secondary | ICD-10-CM | POA: Insufficient documentation

## 2020-05-29 DIAGNOSIS — T7840XA Allergy, unspecified, initial encounter: Secondary | ICD-10-CM | POA: Insufficient documentation

## 2020-05-29 DIAGNOSIS — M1990 Unspecified osteoarthritis, unspecified site: Secondary | ICD-10-CM | POA: Insufficient documentation

## 2020-06-08 NOTE — Progress Notes (Signed)
Cardiology Office Note:    Date:  06/09/2020   ID:  Tina Williams, DOB 1961/02/25, MRN [id number]  PCP:  Midge Minium, MD  Cardiologist:  Shirlee More, MD    Referring MD: Midge Minium, MD    ASSESSMENT:    1. APC (atrial premature contractions)   2. High risk medication use    PLAN:    In order of problems listed above:  1. She is doing very well on low-dose flecainide for atrial arrhythmia no evidence of toxicity.  I feel comfortable seeing her back in the office in 9 months.  She has theAlive Cor we will continue to screen at home and send me any strips suggesting sustained atrial arrhythmia. 2. Stable no evidence of toxicity please note she has had a screening for CAD and a normal cardiac CTA and does not have structural heart disease or heart failure.   Next appointment: 9 months   Medication Adjustments/Labs and Tests Ordered: Current medicines are reviewed at length with the patient today.  Concerns regarding medicines are outlined above.  No orders of the defined types were placed in this encounter.  No orders of the defined types were placed in this encounter.   No chief complaint on file.   History of Present Illness:    Tina Williams is a 59 y.o. female with a hx of frequent APCs on flecainide.  Last seen 01/06/2020.Previous evaluation June 2019 showed normal left ventricular function no evidence of cardiomyopathy and a myocardial perfusion study showed EF of 60% and no scintigraphic or EKG findings of ischemia. Her 14-day ambulatory heart rhythm monitor showed 2.4% burden of APCs and symptomatic events were associated with these. A beta-blocker was ineffective and she was placed on low-dose flecainide for symptom relief.   Compliance with diet, lifestyle and medications: Yes  She had cardiac CTA reported around 02/19/2020 with a calcium score of 0 normal coronary origin and absence of CAD.  The over read of her CT of the chest showed aortic  atherosclerosis otherwise normal.  She has done very well with flecainide little or no arrhythmia.  She is pleased with the quality of her life has had no side effect of visual change headache or shortness of breath EKG shows no signs of toxicity.  Pain palpitations syncope shortness of breath. Past Medical History:  Diagnosis Date  . Abdominal pain, epigastric 05/13/2014  . Allergic rhinoconjunctivitis 09/27/2017  . Allergy   . ANA positive 11/28/2017  . Asthma   . ASTHMA 10/09/2008   HFA 75% p coaching  09/27/10    . Depression   . Early menopause   . Fatigue 11/27/2017  . FOOT PAIN, BILATERAL 10/28/2009   Qualifier: Diagnosis of  By: Birdie Riddle MD, Belenda Cruise    . General medical examination 03/23/2011  . H/O oralPollinosis 03/04/2015  . Hx of adenomatous polyp of colon 01/11/2016  . Hyperlipidemia   . MENOPAUSE, EARLY 07/09/2008   Qualifier: Diagnosis of  By: Ronnald Ramp CMA, Chemira    . Mild persistent asthma without complication 01/02/2955  . Moderate recurrent major depression ([place]) 01/06/2014  . MYALGIA 11/24/2009   Qualifier: Diagnosis of  By: Birdie Riddle MD, Belenda Cruise    . Non-allergic rhinitis   . Obesity   . OBESITY 07/09/2008   Qualifier: Diagnosis of  By: Birdie Riddle MD, Belenda Cruise    . Oral allergy syndrome, subsequent encounter 03/04/2015  . Osteoarthritis   . Osteoporosis 07/2018   T score -2.9  . Palpitation 11/28/2017  . Personal history  of colonic adenoma  11/26/2009  . Polyarthralgia 10/25/2012  . Respiratory tract infection 03/04/2015  . RHINITIS 10/09/2008   Qualifier: Diagnosis of  By: Birdie Riddle MD, Belenda Cruise    . Sinusitis acute   . Tinnitus of right ear 09/27/2017  . URI (upper respiratory infection)   . Vertigo 09/27/2017  . Vitamin D deficiency 10/28/2016    Past Surgical History:  Procedure Laterality Date  . CESAREAN SECTION  1988  . KNEE ARTHROPLASTY    . KNEE SURGERY Right 1998  . WISDOM TOOTH EXTRACTION  1984    Current Medications: Current Meds  Medication Sig  . albuterol (VENTOLIN  HFA) 108 (90 Base) MCG/ACT inhaler Inhale into the lungs every 6 (six) hours as needed for wheezing or shortness of breath.  . DULoxetine (CYMBALTA) 60 MG capsule TAKE 1 CAPSULE BY MOUTH EVERY DAY  . EPINEPHrine (EPIPEN 2-PAK) 0.3 mg/0.3 mL IJ SOAJ injection Use as directed for life-threatening allergic reactions  . flecainide (TAMBOCOR) 50 MG tablet Take 1 tablet (50 mg total) by mouth 2 (two) times daily. PLEASE CALL OFFICE TO SCHEDULE APPOINTMENT FOR FURTHER REFILLS.  Marland Kitchen montelukast (SINGULAIR) 10 MG tablet TAKE 1 TABLET BY MOUTH EVERY DAY AS DIRECTED     Allergies:   Latex, Amoxicillin, Clarithromycin, Naproxen sodium, Penicillins, and Sulfa antibiotics   Social History   Socioeconomic History  . Marital status: Married    Spouse name: Not on file  . Number of children: 3  . Years of education: Not on file  . Highest education level: Not on file  Occupational History  . Occupation: Teacher's Aid  Tobacco Use  . Smoking status: Former Smoker    Packs/day: 0.50    Years: 18.00    Pack years: 9.00    Types: Cigarettes    Quit date: 06/28/1999    Years since quitting: 20.9  . Smokeless tobacco: Never Used  Vaping Use  . Vaping Use: Never used  Substance and Sexual Activity  . Alcohol use: Yes    Alcohol/week: 7.0 standard drinks    Types: 7 Glasses of wine per week    Comment: weekly  . Drug use: Never  . Sexual activity: Yes    Birth control/protection: Post-menopausal    Comment: 1st intercourse 59 yo-Fewer than 5 partners  Other Topics Concern  . Not on file  Social History Narrative  . Not on file   Social Determinants of Health   Financial Resource Strain: Not on file  Food Insecurity: Not on file  Transportation Needs: Not on file  Physical Activity: Not on file  Stress: Not on file  Social Connections: Not on file     Family History: The patient's family history includes Colon cancer (age of onset: 49) in her sister; Diabetes in her brother, mother, and  sister; Emphysema in her father; Epilepsy in her sister; Healthy in her son, son, and son; Heart Problems in her son; Hypertension in her brother and mother; Lung cancer in her sister. ROS:   Please see the history of present illness.    All other systems reviewed and are negative.  EKGs/Labs/Other Studies Reviewed:    The following studies were reviewed today:  EKG:  EKG ordered today and personally reviewed.  The ekg ordered today demonstrates sinus rhythm normal EKG QRS duration unchanged from baseline  Recent Labs: 10/14/2019: ALT 14; BUN 19; Creatinine, Ser 0.57; Hemoglobin 13.4; Platelets 429.0; Potassium 4.7; Sodium 137; TSH 1.14  Recent Lipid Panel    Component Value Date/Time  CHOL 216 (H) 10/14/2019 1044   TRIG 91.0 10/14/2019 1044   HDL 60.70 10/14/2019 1044   CHOLHDL 4 10/14/2019 1044   VLDL 18.2 10/14/2019 1044   LDLCALC 137 (H) 10/14/2019 1044   LDLDIRECT 161.6 03/23/2011 1331    Physical Exam:    VS:  BP 114/68   Pulse 80   Ht 5\' 1"  (1.549 m)   Wt 146 lb (66.2 kg)   SpO2 99%   BMI 27.59 kg/m     Wt Readings from Last 3 Encounters:  06/09/20 146 lb (66.2 kg)  03/30/20 143 lb (64.9 kg)  01/06/20 146 lb 1.9 oz (66.3 kg)     GEN:  Well nourished, well developed in no acute distress HEENT: Normal NECK: No JVD; No carotid bruits LYMPHATICS: No lymphadenopathy CARDIAC: RRR, no murmurs, rubs, gallops RESPIRATORY:  Clear to auscultation without rales, wheezing or rhonchi  ABDOMEN: Soft, non-tender, non-distended MUSCULOSKELETAL:  No edema; No deformity  SKIN: Warm and dry NEUROLOGIC:  Alert and oriented x 3 PSYCHIATRIC:  Normal affect    Signed, Shirlee More, MD  06/09/2020 11:05 AM    [place]

## 2020-06-09 ENCOUNTER — Other Ambulatory Visit: Payer: Self-pay

## 2020-06-09 ENCOUNTER — Ambulatory Visit: Payer: BC Managed Care – PPO | Admitting: Cardiology

## 2020-06-09 ENCOUNTER — Encounter: Payer: Self-pay | Admitting: Cardiology

## 2020-06-09 VITALS — BP 114/68 | HR 80 | Ht 61.0 in | Wt 146.0 lb

## 2020-06-09 DIAGNOSIS — I491 Atrial premature depolarization: Secondary | ICD-10-CM

## 2020-06-09 DIAGNOSIS — Z79899 Other long term (current) drug therapy: Secondary | ICD-10-CM

## 2020-06-09 NOTE — Patient Instructions (Addendum)
Medication Instructions:  Your physician recommends that you continue on your current medications as directed. Please refer to the Current Medication list given to you today.  *If you need a refill on your cardiac medications before your next appointment, please call your pharmacy*   Lab Work: None If you have labs (blood work) drawn today and your tests are completely normal, you will receive your results only by: Marland Kitchen MyChart Message (if you have MyChart) OR . A paper copy in the mail If you have any lab test that is abnormal or we need to change your treatment, we will call you to review the results.   Testing/Procedures: None   Follow-Up: At [facility], you and your health needs are our priority.  As part of our continuing mission to provide you with exceptional heart care, we have created designated Provider Care Teams.  These Care Teams include your primary Cardiologist (physician) and Advanced Practice Providers (APPs -  Physician Assistants and Nurse Practitioners) who all work together to provide you with the care you need, when you need it.  We recommend signing up for the patient portal called "MyChart".  Sign up information is provided on this After Visit Summary.  MyChart is used to connect with patients for Virtual Visits (Telemedicine).  Patients are able to view lab/test results, encounter notes, upcoming appointments, etc.  Non-urgent messages can be sent to your provider as well.   To learn more about what you can do with MyChart, go to [email or web address].    Your next appointment:   9 month(s)  The format for your next appointment:   In Person  Provider:   Shirlee More, MD   Other Instructions 1. Avoid all over-the-counter antihistamines except Claritin/Loratadine and Zyrtec/Cetrizine. 2. Avoid all combination including cold sinus allergies flu decongestant and sleep medications 3. You can use Robitussin DM Mucinex and Mucinex DM for cough. 4. can use  Tylenol aspirin ibuprofen and naproxen but no combinations such as sleep or sinus.

## 2020-07-09 DIAGNOSIS — U071 COVID-19: Secondary | ICD-10-CM | POA: Diagnosis not present

## 2020-07-10 ENCOUNTER — Encounter: Payer: Self-pay | Admitting: Family Medicine

## 2020-07-10 ENCOUNTER — Other Ambulatory Visit: Payer: BC Managed Care – PPO

## 2020-07-10 NOTE — Telephone Encounter (Signed)
Start Vitamin D, Vitamin C and zinc. If anything worsens over the weekend she would need to be seen at one [facility]

## 2020-07-14 DIAGNOSIS — J45998 Other asthma: Secondary | ICD-10-CM | POA: Diagnosis not present

## 2020-07-14 DIAGNOSIS — U071 COVID-19: Secondary | ICD-10-CM | POA: Diagnosis not present

## 2020-07-14 DIAGNOSIS — R059 Cough, unspecified: Secondary | ICD-10-CM | POA: Diagnosis not present

## 2020-07-28 ENCOUNTER — Ambulatory Visit: Payer: BC Managed Care – PPO | Admitting: Family Medicine

## 2020-07-28 ENCOUNTER — Other Ambulatory Visit: Payer: Self-pay

## 2020-08-12 ENCOUNTER — Other Ambulatory Visit: Payer: Self-pay

## 2020-08-12 ENCOUNTER — Ambulatory Visit: Payer: BC Managed Care – PPO | Admitting: Family Medicine

## 2020-09-28 ENCOUNTER — Encounter: Payer: Self-pay | Admitting: Allergy and Immunology

## 2020-09-28 ENCOUNTER — Ambulatory Visit: Payer: BC Managed Care – PPO | Admitting: Allergy and Immunology

## 2020-09-28 ENCOUNTER — Other Ambulatory Visit: Payer: Self-pay

## 2020-09-28 VITALS — BP 110/62 | HR 71 | Resp 16 | Ht 61.0 in | Wt 144.6 lb

## 2020-09-28 DIAGNOSIS — J3089 Other allergic rhinitis: Secondary | ICD-10-CM | POA: Diagnosis not present

## 2020-09-28 DIAGNOSIS — J4530 Mild persistent asthma, uncomplicated: Secondary | ICD-10-CM

## 2020-09-28 DIAGNOSIS — T781XXD Other adverse food reactions, not elsewhere classified, subsequent encounter: Secondary | ICD-10-CM | POA: Diagnosis not present

## 2020-09-28 MED ORDER — ALBUTEROL SULFATE HFA 108 (90 BASE) MCG/ACT IN AERS: INHALATION_SPRAY | RESPIRATORY_TRACT | 1 refills | Status: DC

## 2020-09-28 MED ORDER — MONTELUKAST SODIUM 10 MG PO TABS: 1.0000 | ORAL_TABLET | Freq: Every day | ORAL | 1 refills | Status: DC

## 2020-09-28 NOTE — Patient Instructions (Addendum)
  1. Continue Montelukast 10mg  one tablet one time per day  2. Continue zyrtec if needed  3. Continue Proair HFA if needed  4. Continue Epi-Pen if needed  5. Return in 12 months or earlier if problem

## 2020-09-28 NOTE — Progress Notes (Signed)
[facility] [place] [facility] [place]   Follow-up Note  Referring Provider: Midge Minium, MD Primary Provider: Midge Minium, MD Date of Office Visit: 09/28/2020  Subjective:   Tina Williams (DOB: [birth year]) is a 60 y.o. female who returns to the [facility] and [place] on 09/28/2020 in re-evaluation of the following:  HPI: Demmi returns to this clinic in evaluation of asthma and allergic rhinitis and oral pollinosis syndrome.  Her last visit to this clinic was 30 September 2019.  She has really done well with her airway.  She discontinued her immunotherapy and has had no need to restart any Flonase or Flovent and has not required a systemic steroid or an antibiotic while she continues to use montelukast on a consistent basis.  She can exert herself without any problem and her use of a short acting bronchodilator has been a total of 3 times this past year.  She has completed 5 years of immunotherapy with a very good result.  She had a issue with asthma and allergic rhinoconjunctivitis and oral allergy syndrome and all these have improved dramatically as a result of the treatment.  She contracted COVID in January 2022 mostly manifested as sinus symptoms and body aches and significant fatigue for a few weeks but no long-term sequela as a result of that infection.  Allergies as of 09/28/2020      Reactions   Latex Other (See Comments)   Throat swelling, chest tightness   Amoxicillin Hives   Clarithromycin    REACTION: hives   Naproxen Sodium    REACTION: HIVES   Penicillins    REACTION: hives   Sulfa Antibiotics Hives      Medication List    albuterol 108 (90 Base) MCG/ACT inhaler Commonly known as: VENTOLIN HFA Inhale into the lungs every 6 (six) hours as needed for wheezing or shortness of breath.   DULoxetine 60 MG capsule Commonly known as: CYMBALTA TAKE 1 CAPSULE BY MOUTH EVERY DAY   flecainide 50 MG tablet Commonly known as:  TAMBOCOR Take 1 tablet (50 mg total) by mouth 2 (two) times daily. PLEASE CALL OFFICE TO SCHEDULE APPOINTMENT FOR FURTHER REFILLS.   metoprolol tartrate 100 MG tablet Commonly known as: LOPRESSOR Take 1 tablet (100 mg total) by mouth once for 1 dose. Please take two hours prior to your cardiac CT   montelukast 10 MG tablet Commonly known as: SINGULAIR TAKE 1 TABLET BY MOUTH EVERY DAY AS DIRECTED       Past Medical History:  Diagnosis Date  . Abdominal pain, epigastric 05/13/2014  . Allergic rhinoconjunctivitis 09/27/2017  . Allergy   . ANA positive 11/28/2017  . Asthma   . ASTHMA 10/09/2008   HFA 75% p coaching  09/27/10    . Depression   . Early menopause   . Fatigue 11/27/2017  . FOOT PAIN, BILATERAL 10/28/2009   Qualifier: Diagnosis of  By: Birdie Riddle MD, Belenda Cruise    . General medical examination 03/23/2011  . H/O oralPollinosis 03/04/2015  . Hx of adenomatous polyp of colon 01/11/2016  . Hyperlipidemia   . MENOPAUSE, EARLY 07/09/2008   Qualifier: Diagnosis of  By: Ronnald Ramp CMA, Chemira    . Mild persistent asthma without complication 10/31/4330  . Moderate recurrent major depression ([place]) 01/06/2014  . MYALGIA 11/24/2009   Qualifier: Diagnosis of  By: Birdie Riddle MD, Belenda Cruise    . Non-allergic rhinitis   . Obesity   . OBESITY 07/09/2008   Qualifier: Diagnosis of  By:  Birdie Riddle MD, Belenda Cruise    . Oral allergy syndrome, subsequent encounter 03/04/2015  . Osteoarthritis   . Osteoporosis 07/2018   T score -2.9  . Palpitation 11/28/2017  . Personal history of colonic adenoma  11/26/2009  . Polyarthralgia 10/25/2012  . Respiratory tract infection 03/04/2015  . RHINITIS 10/09/2008   Qualifier: Diagnosis of  By: Birdie Riddle MD, Belenda Cruise    . Sinusitis acute   . Tinnitus of right ear 09/27/2017  . URI (upper respiratory infection)   . Vertigo 09/27/2017  . Vitamin D deficiency 10/28/2016    Past Surgical History:  Procedure Laterality Date  . CESAREAN SECTION  1988  . KNEE ARTHROPLASTY    . KNEE SURGERY Right  1998  . WISDOM TOOTH EXTRACTION  1984    Review of systems negative except as noted in HPI / PMHx or noted below:  Review of Systems  Constitutional: Negative.   HENT: Negative.   Eyes: Negative.   Respiratory: Negative.   Cardiovascular: Negative.   Gastrointestinal: Negative.   Genitourinary: Negative.   Musculoskeletal: Negative.   Skin: Negative.   Neurological: Negative.   Endo/Heme/Allergies: Negative.   Psychiatric/Behavioral: Negative.      Objective:   Vitals:   09/28/20 1102  BP: 110/62  Pulse: 71  Resp: 16  SpO2: 99%   Height: 5\' 1"  (154.9 cm)  Weight: 144 lb 9.6 oz (65.6 kg)   Physical Exam Constitutional:      Appearance: She is not diaphoretic.  HENT:     Head: Normocephalic.     Right Ear: Tympanic membrane, ear canal and external ear normal.     Left Ear: Tympanic membrane, ear canal and external ear normal.     Nose: Nose normal. No mucosal edema or rhinorrhea.     Mouth/Throat:     Pharynx: Uvula midline. No oropharyngeal exudate.  Eyes:     Conjunctiva/sclera: Conjunctivae normal.  Neck:     Thyroid: No thyromegaly.     Trachea: Trachea normal. No tracheal tenderness or tracheal deviation.  Cardiovascular:     Rate and Rhythm: Normal rate and regular rhythm.     Heart sounds: Normal heart sounds, S1 normal and S2 normal. No murmur heard.   Pulmonary:     Effort: No respiratory distress.     Breath sounds: Normal breath sounds. No stridor. No wheezing or rales.  Lymphadenopathy:     Head:     Right side of head: No tonsillar adenopathy.     Left side of head: No tonsillar adenopathy.     Cervical: No cervical adenopathy.  Skin:    Findings: No erythema or rash.     Nails: There is no clubbing.  Neurological:     Mental Status: She is alert.     Diagnostics:    Spirometry was performed and demonstrated an FEV1 of 2.06 at 89 % of predicted.  Assessment and Plan:   1. Asthma, well controlled, mild persistent   2. Perennial  allergic rhinitis   3. Oral allergy syndrome, subsequent encounter      1. Continue Montelukast 10mg  one tablet one time per day  2. Continue zyrtec if needed  3. Continue Proair HFA if needed  4. Continue Epi-Pen if needed  5. Return in 12 months or earlier if problem  [name] appears to be doing very well and will continue to have her use montelukast as her only controller agent.  She has several medicines to use should they be required.  I will see  her back in this clinic in 1 year or earlier if there is a problem.  Allena Katz, MD Allergy / Immunology [place]

## 2020-09-29 ENCOUNTER — Encounter: Payer: Self-pay | Admitting: Allergy and Immunology

## 2020-10-01 ENCOUNTER — Other Ambulatory Visit: Payer: Self-pay | Admitting: *Deleted

## 2020-10-01 MED ORDER — ALBUTEROL SULFATE HFA 108 (90 BASE) MCG/ACT IN AERS: 2.0000 | INHALATION_SPRAY | RESPIRATORY_TRACT | 1 refills | Status: DC | PRN

## 2020-10-19 ENCOUNTER — Ambulatory Visit (INDEPENDENT_AMBULATORY_CARE_PROVIDER_SITE_OTHER): Payer: BC Managed Care – PPO | Admitting: Family Medicine

## 2020-10-19 ENCOUNTER — Encounter: Payer: Self-pay | Admitting: Family Medicine

## 2020-10-19 ENCOUNTER — Other Ambulatory Visit: Payer: Self-pay

## 2020-10-19 VITALS — BP 120/70 | HR 74 | Temp 97.4°F | Resp 20 | Ht 61.0 in | Wt 140.6 lb

## 2020-10-19 DIAGNOSIS — E559 Vitamin D deficiency, unspecified: Secondary | ICD-10-CM

## 2020-10-19 DIAGNOSIS — E785 Hyperlipidemia, unspecified: Secondary | ICD-10-CM | POA: Diagnosis not present

## 2020-10-19 DIAGNOSIS — Z1231 Encounter for screening mammogram for malignant neoplasm of breast: Secondary | ICD-10-CM

## 2020-10-19 DIAGNOSIS — F331 Major depressive disorder, recurrent, moderate: Secondary | ICD-10-CM

## 2020-10-19 DIAGNOSIS — Z1159 Encounter for screening for other viral diseases: Secondary | ICD-10-CM

## 2020-10-19 DIAGNOSIS — M810 Age-related osteoporosis without current pathological fracture: Secondary | ICD-10-CM | POA: Diagnosis not present

## 2020-10-19 DIAGNOSIS — Z114 Encounter for screening for human immunodeficiency virus [HIV]: Secondary | ICD-10-CM

## 2020-10-19 DIAGNOSIS — Z0000 Encounter for general adult medical examination without abnormal findings: Secondary | ICD-10-CM

## 2020-10-19 LAB — CBC WITH DIFFERENTIAL/PLATELET
Basophils Absolute: 0 10*3/uL (ref 0.0–0.1)
Basophils Relative: 0.5 % (ref 0.0–3.0)
Eosinophils Absolute: 0.2 10*3/uL (ref 0.0–0.7)
Eosinophils Relative: 4.2 % (ref 0.0–5.0)
HCT: 42.5 % (ref 36.0–46.0)
Hemoglobin: 13.8 g/dL (ref 12.0–15.0)
Lymphocytes Relative: 28.3 % (ref 12.0–46.0)
Lymphs Abs: 1.3 10*3/uL (ref 0.7–4.0)
MCHC: 32.5 g/dL (ref 30.0–36.0)
MCV: 87.8 fl (ref 78.0–100.0)
Monocytes Absolute: 0.4 10*3/uL (ref 0.1–1.0)
Monocytes Relative: 9.1 % (ref 3.0–12.0)
Neutro Abs: 2.7 10*3/uL (ref 1.4–7.7)
Neutrophils Relative %: 57.9 % (ref 43.0–77.0)
Platelets: 394 10*3/uL (ref 150.0–400.0)
RBC: 4.85 Mil/uL (ref 3.87–5.11)
RDW: 14.5 % (ref 11.5–15.5)
WBC: 4.6 10*3/uL (ref 4.0–10.5)

## 2020-10-19 LAB — HEPATIC FUNCTION PANEL
ALT: 13 U/L (ref 0–35)
AST: 17 U/L (ref 0–37)
Albumin: 4.3 g/dL (ref 3.5–5.2)
Alkaline Phosphatase: 78 U/L (ref 39–117)
Bilirubin, Direct: 0.1 mg/dL (ref 0.0–0.3)
Total Bilirubin: 0.7 mg/dL (ref 0.2–1.2)
Total Protein: 6.9 g/dL (ref 6.0–8.3)

## 2020-10-19 LAB — BASIC METABOLIC PANEL
BUN: 14 mg/dL (ref 6–23)
CO2: 29 mEq/L (ref 19–32)
Calcium: 9.4 mg/dL (ref 8.4–10.5)
Chloride: 102 mEq/L (ref 96–112)
Creatinine, Ser: 0.61 mg/dL (ref 0.40–1.20)
GFR: 97.55 mL/min (ref >60.00)
Glucose, Bld: 90 mg/dL (ref 70–99)
Potassium: 4.5 mEq/L (ref 3.5–5.1)
Sodium: 139 mEq/L (ref 135–145)

## 2020-10-19 LAB — LIPID PANEL
Cholesterol: 232 mg/dL — ABNORMAL HIGH (ref 0–200)
HDL: 77.1 mg/dL (ref >39.00)
LDL Cholesterol: 133 mg/dL — ABNORMAL HIGH (ref 0–99)
NonHDL: 154.67
Total CHOL/HDL Ratio: 3
Triglycerides: 109 mg/dL (ref 0.0–149.0)
VLDL: 21.8 mg/dL (ref 0.0–40.0)

## 2020-10-19 LAB — VITAMIN D 25 HYDROXY (VIT D DEFICIENCY, FRACTURES): VITD: 72.13 ng/mL (ref 30.00–100.00)

## 2020-10-19 LAB — TSH: TSH: 1.84 u[IU]/mL (ref 0.35–4.50)

## 2020-10-19 MED ORDER — DULOXETINE HCL 30 MG PO CPEP: 30.0000 mg | ORAL_CAPSULE | Freq: Every day | ORAL | 3 refills | Status: DC

## 2020-10-19 NOTE — Patient Instructions (Signed)
Follow up in 1 year- sooner if needed We'll notify you of your lab results and make any changes if needed Continue to work on healthy diet and regular exercise- you're doing great! They should call you to schedule your mammo and bone density Call with any questions or concerns Hang in there!!!

## 2020-10-19 NOTE — Assessment & Plan Note (Signed)
Repeat DEXA and check Vit D level.  Replete prn.

## 2020-10-19 NOTE — Assessment & Plan Note (Signed)
Pt has hx of this but has been attempting to control w/ diet and exercise.  Check labs and start meds prn.

## 2020-10-19 NOTE — Progress Notes (Signed)
   Subjective:    Patient ID: Kayna Suppa, female    DOB: [birth year], 60 y.o.   MRN: [id number]  HPI CPE- UTD on colonoscopy (due this summer), Tdap.  Due for mammo (pt to schedule), repeat DEXA.  Patient Care Team    Relationship Specialty Notifications Start End  Midge Minium, MD PCP - General   06/09/10   Gatha Mayer, MD Consulting Physician Gastroenterology  10/23/15   Molli Posey, MD Consulting Physician Obstetrics and Gynecology  10/23/15   Jiles Prows, MD Consulting Physician Allergy and Immunology  10/28/16     Health Maintenance  Topic Date Due  . Hepatitis C Screening  Never done  . HIV Screening  Never done  . COVID-19 Vaccine (1) 11/04/2020 (Originally 12/06/1972)  . MAMMOGRAM  12/24/2020 (Originally 05/18/2019)  . COLONOSCOPY (Pts 45-40yrs Insurance coverage will need to be confirmed)  12/31/2020  . INFLUENZA VACCINE  01/25/2021  . PAP SMEAR-Modifier  08/01/2021  . TETANUS/TDAP  10/22/2025  . HPV VACCINES  Aged Out      Review of Systems Patient reports no vision/ hearing changes, adenopathy,fever, weight change,  persistant/recurrent hoarseness , swallowing issues, chest pain, palpitations, edema, persistant/recurrent cough, hemoptysis, dyspnea (rest/exertional/paroxysmal nocturnal), gastrointestinal bleeding (melena, rectal bleeding), abdominal pain, significant heartburn, bowel changes, GU symptoms (dysuria, hematuria, incontinence), Gyn symptoms (abnormal  bleeding, pain),  syncope, focal weakness, memory loss, numbness & tingling, skin/hair/nail changes, abnormal bruising or bleeding, anxiety, or depression.   This visit occurred during the SARS-CoV-2 public health emergency.  Safety protocols were in place, including screening questions prior to the visit, additional usage of staff PPE, and extensive cleaning of exam room while observing appropriate contact time as indicated for disinfecting solutions.       Objective:   Physical Exam General  Appearance:    Alert, cooperative, no distress, appears stated age  Head:    Normocephalic, without obvious abnormality, atraumatic  Eyes:    PERRL, conjunctiva/corneas clear, EOM's intact, fundi    benign, both eyes  Ears:    Normal TM's and external ear canals, both ears  Nose:   Deferred due to COVID  Throat:   Neck:   Supple, symmetrical, trachea midline, no adenopathy;    Thyroid: no enlargement/tenderness/nodules  Back:     Symmetric, no curvature, ROM normal, no CVA tenderness  Lungs:     Clear to auscultation bilaterally, respirations unlabored  Chest Wall:    No tenderness or deformity   Heart:    Regular rate and rhythm, S1 and S2 normal, no murmur, rub   or gallop  Breast Exam:    Deferred to GYN  Abdomen:     Soft, non-tender, bowel sounds active all four quadrants,    no masses, no organomegaly  Genitalia:    Deferred to GYN  Rectal:    Extremities:   Extremities normal, atraumatic, no cyanosis or edema  Pulses:   2+ and symmetric all extremities  Skin:   Skin color, texture, turgor normal, no rashes or lesions  Lymph nodes:   Cervical, supraclavicular, and axillary nodes normal  Neurologic:   CNII-XII intact, normal strength, sensation and reflexes    throughout          Assessment & Plan:

## 2020-10-19 NOTE — Assessment & Plan Note (Signed)
Currently feeling much better.  Would like to lower Cymbalta dose to 30mg  daily.  New prescription sent.

## 2020-10-19 NOTE — Assessment & Plan Note (Signed)
Pt's PE WNL.  Due for repeat mammo- ordered.  Due for DEXA- ordered.  UTD on colonoscopy.  UTD on immunizations.  Check labs.  Anticipatory guidance provided.

## 2020-10-20 LAB — HEPATITIS C ANTIBODY
Hepatitis C Ab: NONREACTIVE
SIGNAL TO CUT-OFF: 0.01 (ref <1.00)

## 2020-10-20 LAB — HIV ANTIBODY (ROUTINE TESTING W REFLEX): HIV 1&2 Ab, 4th Generation: NONREACTIVE

## 2020-11-02 ENCOUNTER — Ambulatory Visit (HOSPITAL_BASED_OUTPATIENT_CLINIC_OR_DEPARTMENT_OTHER)
Admission: RE | Admit: 2020-11-02 | Discharge: 2020-11-02 | Disposition: A | Discharge status: Home / Self Care | Payer: BC Managed Care – PPO | Source: Ambulatory Visit | Attending: Family Medicine | Admitting: Family Medicine

## 2020-11-02 ENCOUNTER — Encounter (HOSPITAL_BASED_OUTPATIENT_CLINIC_OR_DEPARTMENT_OTHER): Payer: Self-pay

## 2020-11-02 ENCOUNTER — Other Ambulatory Visit: Payer: Self-pay

## 2020-11-02 DIAGNOSIS — Z1231 Encounter for screening mammogram for malignant neoplasm of breast: Secondary | ICD-10-CM | POA: Insufficient documentation

## 2020-11-02 DIAGNOSIS — M810 Age-related osteoporosis without current pathological fracture: Secondary | ICD-10-CM | POA: Insufficient documentation

## 2020-11-03 ENCOUNTER — Other Ambulatory Visit: Payer: Self-pay

## 2020-11-03 MED ORDER — ALENDRONATE SODIUM 70 MG PO TABS: 70.0000 mg | ORAL_TABLET | ORAL | 11 refills | Status: DC

## 2020-11-15 ENCOUNTER — Other Ambulatory Visit: Payer: Self-pay | Admitting: Family Medicine

## 2020-12-05 ENCOUNTER — Other Ambulatory Visit: Payer: Self-pay | Admitting: Cardiology

## 2020-12-07 NOTE — Telephone Encounter (Signed)
Flecainide 50 mg #180 x 2 refills sent to pharmacy

## 2020-12-08 ENCOUNTER — Other Ambulatory Visit: Payer: Self-pay | Admitting: *Deleted

## 2020-12-08 MED ORDER — FLECAINIDE ACETATE 50 MG PO TABS: 50.0000 mg | ORAL_TABLET | Freq: Two times a day (BID) | ORAL | 0 refills | Status: DC

## 2020-12-08 NOTE — Telephone Encounter (Signed)
Rx refill sent to pharmacy. 

## 2020-12-23 ENCOUNTER — Encounter: Payer: Self-pay | Admitting: *Deleted

## 2021-01-03 ENCOUNTER — Other Ambulatory Visit: Payer: Self-pay | Admitting: Family Medicine

## 2021-02-13 ENCOUNTER — Other Ambulatory Visit: Payer: Self-pay | Admitting: Allergy and Immunology

## 2021-02-18 ENCOUNTER — Other Ambulatory Visit: Payer: Self-pay | Admitting: Family Medicine

## 2021-03-12 ENCOUNTER — Other Ambulatory Visit: Payer: Self-pay | Admitting: Family Medicine

## 2021-03-20 ENCOUNTER — Other Ambulatory Visit: Payer: Self-pay | Admitting: Family Medicine

## 2021-03-21 NOTE — Progress Notes (Signed)
Cardiology Office Note:    Date:  03/22/2021   ID:  Tina Williams, DOB [birth year], MRN [id number]  PCP:  Midge Minium, MD  Cardiologist:  Shirlee More, MD    Referring MD: Midge Minium, MD    ASSESSMENT:    1. APC (atrial premature contractions)   2. High risk medication use    PLAN:    In order of problems listed above:  She has had a very good response to low-dose flecainide resolving her bothersome atrial arrhythmia we discussed options including withdrawal of flecainide she is quite hesitant I think in her case it is a safe choice with no evidence of CAD and a structurally normal heart and very low-dose we will continue the same she has the iPhone receiver to monitor rhythm at home and can transmit episodes to me and we will continue her current dose of flecainide 50 mg twice daily.   Next appointment: 1 year   Medication Adjustments/Labs and Tests Ordered: Current medicines are reviewed at length with the patient today.  Concerns regarding medicines are outlined above.  No orders of the defined types were placed in this encounter.  No orders of the defined types were placed in this encounter.   No chief complaint on file.   History of Present Illness:    Tina Williams is a 60 y.o. female with a hx of frequent and symptomatic APCs suppressed with flecainide last seen 06/09/2020.Previous evaluation June 2019 showed normal left ventricular function no evidence of cardiomyopathy and a myocardial perfusion study showed EF of 60% and no scintigraphic or EKG findings of ischemia. Her 14-day ambulatory heart rhythm monitor showed 2.4% burden of APCs and symptomatic events were associated with these. A beta-blocker was ineffective and she was placed on low-dose flecainide for symptom relief.She had cardiac CTA reported around 02/19/2020 with a calcium score of 0 normal coronary origin and absence of CAD.  The over read of her CT of the chest showed aortic atherosclerosis  otherwise normal   Compliance with diet, lifestyle and medications: Yes  She has done very well with low-dose flecainide and said no breakthrough arrhythmia or palpitation and is pleased with the quality of her life She avoids over-the-counter proarrhythmic drugs No side effect of flecainide with symptoms of heart failure edema shortness of breath or visual changes. Her EKG shows no signs of 1C toxicity. Past Medical History:  Diagnosis Date   Abdominal pain, epigastric 05/13/2014   Allergic rhinoconjunctivitis 09/27/2017   Allergy    ANA positive 11/28/2017   Asthma    ASTHMA 10/09/2008   HFA 75% p coaching  09/27/10     Depression    Early menopause    Fatigue 11/27/2017   FOOT PAIN, BILATERAL 10/28/2009   Qualifier: Diagnosis of  By: Birdie Riddle MD, Lurena Nida medical examination 03/23/2011   H/O oralPollinosis 03/04/2015   Hx of adenomatous polyp of colon 01/11/2016   Hyperlipidemia    MENOPAUSE, EARLY 07/09/2008   Qualifier: Diagnosis of  By: Ronnald Ramp CMA, Chemira     Mild persistent asthma without complication 11/28/8754   Moderate recurrent major depression ([place]) 01/06/2014   MYALGIA 11/24/2009   Qualifier: Diagnosis of  By: Birdie Riddle MD, Katherine     Non-allergic rhinitis    Obesity    OBESITY 07/09/2008   Qualifier: Diagnosis of  By: Birdie Riddle MD, Jonathon Resides allergy syndrome, subsequent encounter 03/04/2015   Osteoarthritis    Osteoporosis 07/2018   T  score -2.9   Palpitation 11/28/2017   Personal history of colonic adenoma  11/26/2009   Polyarthralgia 10/25/2012   Respiratory tract infection 03/04/2015   RHINITIS 10/09/2008   Qualifier: Diagnosis of  By: Birdie Riddle MD, Belenda Cruise     Sinusitis acute    Tinnitus of right ear 09/27/2017   URI (upper respiratory infection)    Vertigo 09/27/2017   Vitamin D deficiency 10/28/2016    Past Surgical History:  Procedure Laterality Date   CESAREAN SECTION  1988   KNEE ARTHROPLASTY     KNEE SURGERY Right 1998   WISDOM TOOTH EXTRACTION  1984     Current Medications: Current Meds  Medication Sig   albuterol (PROAIR HFA) 108 (90 Base) MCG/ACT inhaler Inhale 2 puffs into the lungs every 4 (four) hours as needed for wheezing or shortness of breath.   alendronate (FOSAMAX) 70 MG tablet Take 1 tablet (70 mg total) by mouth every 7 (seven) days. Take with a full glass of water on an empty stomach.   DULoxetine (CYMBALTA) 30 MG capsule TAKE 1 CAPSULE BY MOUTH EVERY DAY   flecainide (TAMBOCOR) 50 MG tablet TAKE 1 TABLET BY MOUTH TWICE A DAY   montelukast (SINGULAIR) 10 MG tablet TAKE 1 TABLET BY MOUTH EVERY DAY     Allergies:   Latex, Amoxicillin, Clarithromycin, Naproxen sodium, Penicillins, and Sulfa antibiotics   Social History   Socioeconomic History   Marital status: Married    Spouse name: Not on file   Number of children: 3   Years of education: Not on file   Highest education level: Not on file  Occupational History   Occupation: Teacher's Aid  Tobacco Use   Smoking status: Former    Packs/day: 0.50    Years: 18.00    Pack years: 9.00    Types: Cigarettes    Quit date: 06/28/1999    Years since quitting: 21.7   Smokeless tobacco: Never  Vaping Use   Vaping Use: Never used  Substance and Sexual Activity   Alcohol use: Yes    Alcohol/week: 7.0 standard drinks    Types: 7 Glasses of wine per week    Comment: weekly   Drug use: Never   Sexual activity: Yes    Birth control/protection: Post-menopausal    Comment: 1st intercourse 60 yo-Fewer than 5 partners  Other Topics Concern   Not on file  Social History Narrative   Not on file   Social Determinants of Health   Financial Resource Strain: Not on file  Food Insecurity: Not on file  Transportation Needs: Not on file  Physical Activity: Not on file  Stress: Not on file  Social Connections: Not on file     Family History: The patient's family history includes Colon cancer (age of onset: 65) in her sister; Diabetes in her brother, mother, and sister;  Emphysema in her father; Epilepsy in her sister; Healthy in her son, son, and son; Heart Problems in her son; Hypertension in her brother and mother; Lung cancer in her sister. ROS:   Please see the history of present illness.    All other systems reviewed and are negative.  EKGs/Labs/Other Studies Reviewed:    The following studies were reviewed today:  EKG:  EKG ordered today and personally reviewed.  The ekg ordered today demonstrates sinus rhythm T wave V1 V2 V3 inversion QRS duration narrow at 92 ms  Recent Labs: Reviewed potassium and renal function are normal TSH normal  10/19/2020: ALT 13; BUN 14; Creatinine,  Ser 0.61; Hemoglobin 13.8; Platelets 394.0; Potassium 4.5; Sodium 139; TSH 1.84  Recent Lipid Panel    Component Value Date/Time   CHOL 232 (H) 10/19/2020 1042   TRIG 109.0 10/19/2020 1042   HDL 77.10 10/19/2020 1042   CHOLHDL 3 10/19/2020 1042   VLDL 21.8 10/19/2020 1042   LDLCALC 133 (H) 10/19/2020 1042   LDLDIRECT 161.6 03/23/2011 1331    Physical Exam:    VS:  BP 100/66 (BP Location: Left Arm, Patient Position: Sitting, Cuff Size: Normal)   Pulse 67   Ht 5\' 1"  (1.549 m)   Wt 149 lb (67.6 kg)   SpO2 97%   BMI 28.15 kg/m     Wt Readings from Last 3 Encounters:  03/22/21 149 lb (67.6 kg)  10/19/20 140 lb 9.6 oz (63.8 kg)  09/28/20 144 lb 9.6 oz (65.6 kg)     GEN:  Well nourished, well developed in no acute distress HEENT: Normal NECK: No JVD; No carotid bruits LYMPHATICS: No lymphadenopathy CARDIAC: RRR, no murmurs, rubs, gallops RESPIRATORY:  Clear to auscultation without rales, wheezing or rhonchi  ABDOMEN: Soft, non-tender, non-distended MUSCULOSKELETAL:  No edema; No deformity  SKIN: Warm and dry NEUROLOGIC:  Alert and oriented x 3 PSYCHIATRIC:  Normal affect    Signed, Shirlee More, MD  03/22/2021 10:33 AM    [facility]

## 2021-03-22 ENCOUNTER — Encounter: Payer: Self-pay | Admitting: Cardiology

## 2021-03-22 ENCOUNTER — Ambulatory Visit: Payer: BC Managed Care – PPO | Admitting: Cardiology

## 2021-03-22 ENCOUNTER — Other Ambulatory Visit: Payer: Self-pay

## 2021-03-22 VITALS — BP 100/66 | HR 67 | Ht 61.0 in | Wt 149.0 lb

## 2021-03-22 DIAGNOSIS — Z79899 Other long term (current) drug therapy: Secondary | ICD-10-CM | POA: Diagnosis not present

## 2021-03-22 DIAGNOSIS — I491 Atrial premature depolarization: Secondary | ICD-10-CM

## 2021-03-22 MED ORDER — FLECAINIDE ACETATE 50 MG PO TABS: 50.0000 mg | ORAL_TABLET | Freq: Two times a day (BID) | ORAL | 3 refills | Status: DC

## 2021-03-22 NOTE — Addendum Note (Signed)
Addended by: Resa Miner I on: 03/22/2021 10:36 AM   Modules accepted: Orders

## 2021-03-22 NOTE — Telephone Encounter (Signed)
Ok to change to 90 day rx?

## 2021-03-22 NOTE — Patient Instructions (Signed)

## 2021-06-17 DIAGNOSIS — M791 Myalgia, unspecified site: Secondary | ICD-10-CM | POA: Diagnosis not present

## 2021-06-17 DIAGNOSIS — R509 Fever, unspecified: Secondary | ICD-10-CM | POA: Diagnosis not present

## 2021-06-17 DIAGNOSIS — J029 Acute pharyngitis, unspecified: Secondary | ICD-10-CM | POA: Diagnosis not present

## 2021-06-17 DIAGNOSIS — H9209 Otalgia, unspecified ear: Secondary | ICD-10-CM | POA: Diagnosis not present

## 2021-06-17 DIAGNOSIS — Z20828 Contact with and (suspected) exposure to other viral communicable diseases: Secondary | ICD-10-CM | POA: Diagnosis not present

## 2021-08-05 ENCOUNTER — Ambulatory Visit: Payer: BC Managed Care – PPO | Admitting: Orthopedic Surgery

## 2021-08-05 ENCOUNTER — Other Ambulatory Visit: Payer: Self-pay

## 2021-08-05 ENCOUNTER — Ambulatory Visit: Payer: Self-pay

## 2021-08-05 DIAGNOSIS — M6702 Short Achilles tendon (acquired), left ankle: Secondary | ICD-10-CM

## 2021-08-05 DIAGNOSIS — M722 Plantar fascial fibromatosis: Secondary | ICD-10-CM | POA: Diagnosis not present

## 2021-08-05 DIAGNOSIS — M6701 Short Achilles tendon (acquired), right ankle: Secondary | ICD-10-CM | POA: Diagnosis not present

## 2021-08-05 DIAGNOSIS — M79672 Pain in left foot: Secondary | ICD-10-CM | POA: Diagnosis not present

## 2021-08-05 DIAGNOSIS — M79671 Pain in right foot: Secondary | ICD-10-CM | POA: Diagnosis not present

## 2021-08-07 ENCOUNTER — Encounter: Payer: Self-pay | Admitting: Orthopedic Surgery

## 2021-08-07 NOTE — Progress Notes (Signed)
Office Visit Note   Patient: Tina Williams           Date of Birth: 07/09/1960           MRN: [id number] Visit Date: 08/05/2021              Requested by: Midge Minium, MD [address] [place],  [place] [address] PCP: Midge Minium, MD  Chief Complaint  Patient presents with   Right Foot - Pain   Left Foot - Pain      HPI: Patient is a 61 year old woman is seen for initial evaluation for bilateral midfoot plantar pain.  Patient did have an MRI scan of her left foot in 2019.  Patient states she has tried arch supports without relief.  Assessment & Plan: Visit Diagnoses:  1. Bilateral foot pain   2. Plantar fascial fibromatosis     Plan: Recommended Achilles stretching Voltaren gel continue with the [place] sneakers.  Discussed the nature of plantar fibromatosis and would avoid surgery at this time.  Follow-Up Instructions: Return if symptoms worsen or fail to improve.   Ortho Exam  Patient is alert, oriented, no adenopathy, well-dressed, normal affect, normal respiratory effort. Examination patient has good pulses she has hallux rigidus with bony spurs of both great toes but these are not painful.  She does have a palpable plantar fibromatosis nodule on both feet mid substance about 10 mm in diameter there is no skin color or temperature changes.  Patient has no Dupuytren's contracture in her hands.  Ankle dorsiflexion to neutral.  Imaging: No results found. No images are attached to the encounter.  Labs: Lab Results  Component Value Date   ESRSEDRATE 11 12/15/2017     Lab Results  Component Value Date   ALBUMIN 4.3 10/19/2020   ALBUMIN 4.5 10/14/2019   ALBUMIN 4.3 07/25/2018    No results found for: MG Lab Results  Component Value Date   VD25OH 72.13 10/19/2020   VD25OH 61.54 10/14/2019   VD25OH 73.61 07/25/2018    No results found for: PREALBUMIN CBC EXTENDED Latest Ref Rng & Units 10/19/2020 10/14/2019 07/25/2018  WBC 4.0 - 10.5 K/uL 4.6  6.1 6.6  RBC 3.87 - 5.11 Mil/uL 4.85 4.75 4.76  HGB 12.0 - 15.0 g/dL 13.8 13.4 13.3  HCT 36.0 - 46.0 % 42.5 41.1 40.7  PLT 150.0 - 400.0 K/uL 394.0 429.0(H) 475.0(H)  NEUTROABS 1.4 - 7.7 K/uL 2.7 3.9 4.4  LYMPHSABS 0.7 - 4.0 K/uL 1.3 1.5 1.4     There is no height or weight on file to calculate BMI.  Orders:  Orders Placed This Encounter  Procedures   XR Foot 2 Views Left   XR Foot 2 Views Right   No orders of the defined types were placed in this encounter.    Procedures: No procedures performed  Clinical Data: No additional findings.  ROS:  All other systems negative, except as noted in the HPI. Review of Systems  Objective: Vital Signs: There were no vitals taken for this visit.  Specialty Comments:  No specialty comments available.  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   Pain in left ankle and joints of left foot 05/07/2018   Palpitation 11/28/2017   ANA positive 11/28/2017  Fatigue 11/27/2017   Mild persistent asthma without complication 78/58/8502   Vertigo 09/27/2017   Allergic rhinoconjunctivitis 09/27/2017   Tinnitus of right ear 09/27/2017   Vitamin D deficiency 10/28/2016   Hx of adenomatous polyp of colon 01/11/2016   H/O oralPollinosis 03/04/2015   Oral allergy syndrome, subsequent encounter 03/04/2015   Abdominal pain, epigastric 05/13/2014   Moderate recurrent major depression ([place]) 01/06/2014   Polyarthralgia 10/25/2012   Hyperlipidemia 03/23/2011   General medical examination 03/23/2011   Personal history of colonic adenoma  11/26/2009   MYALGIA 11/24/2009   FOOT PAIN, BILATERAL 10/28/2009   RHINITIS 10/09/2008   ASTHMA 10/09/2008   MENOPAUSE, EARLY 07/09/2008   Past Medical History:  Diagnosis Date   Abdominal pain, epigastric  05/13/2014   Allergic rhinoconjunctivitis 09/27/2017   Allergy    ANA positive 11/28/2017   Asthma    ASTHMA 10/09/2008   HFA 75% p coaching  09/27/10     Depression    Early menopause    Fatigue 11/27/2017   FOOT PAIN, BILATERAL 10/28/2009   Qualifier: Diagnosis of  By: Birdie Riddle MD, Lurena Nida medical examination 03/23/2011   H/O oralPollinosis 03/04/2015   Hx of adenomatous polyp of colon 01/11/2016   Hyperlipidemia    MENOPAUSE, EARLY 07/09/2008   Qualifier: Diagnosis of  By: Ronnald Ramp CMA, Chemira     Mild persistent asthma without complication 01/01/4127   Moderate recurrent major depression ([place]) 01/06/2014   MYALGIA 11/24/2009   Qualifier: Diagnosis of  By: Birdie Riddle MD, Katherine     Non-allergic rhinitis    Obesity    OBESITY 07/09/2008   Qualifier: Diagnosis of  By: Birdie Riddle MD, Katherine     Oral allergy syndrome, subsequent encounter 03/04/2015   Osteoarthritis    Osteoporosis 07/2018   T score -2.9   Palpitation 11/28/2017   Personal history of colonic adenoma  11/26/2009   Polyarthralgia 10/25/2012   Respiratory tract infection 03/04/2015   RHINITIS 10/09/2008   Qualifier: Diagnosis of  By: Birdie Riddle MD, Katherine     Sinusitis acute    Tinnitus of right ear 09/27/2017   URI (upper respiratory infection)    Vertigo 09/27/2017   Vitamin D deficiency 10/28/2016    Family History  Problem Relation Age of Onset   Epilepsy Sister    Colon cancer Sister 34   Hypertension Mother    Diabetes Mother    Diabetes Brother    Hypertension Brother    Lung cancer Sister        was a smoker   Diabetes Sister    Emphysema Father        was a smoker   Healthy Son    Healthy Son    Heart Problems Son        age 26   Healthy Son     Past Surgical History:  Procedure Laterality Date   [place] Right 1998   WISDOM TOOTH EXTRACTION  1984   Social History   Occupational History   Occupation: Teacher's Aid  Tobacco Use   Smoking status: Former     Packs/day: 0.50    Years: 18.00    Pack years: 9.00    Types: Cigarettes    Quit date: 06/28/1999    Years since quitting: 22.1   Smokeless tobacco: Never  Vaping Use   Vaping Use: Never used  Substance and Sexual Activity   Alcohol use: Yes  Alcohol/week: 7.0 standard drinks    Types: 7 Glasses of wine per week    Comment: weekly   Drug use: Never   Sexual activity: Yes    Birth control/protection: Post-menopausal    Comment: 1st intercourse 61 yo-Fewer than 5 partners

## 2021-08-24 ENCOUNTER — Other Ambulatory Visit: Payer: Self-pay

## 2021-08-24 ENCOUNTER — Ambulatory Visit (AMBULATORY_SURGERY_CENTER): Payer: BC Managed Care – PPO

## 2021-08-24 VITALS — Ht 61.0 in | Wt 144.0 lb

## 2021-08-24 DIAGNOSIS — Z86010 Personal history of colonic polyps: Secondary | ICD-10-CM

## 2021-08-24 NOTE — Progress Notes (Signed)
No allergies to soy or egg Pt is not on blood thinners or diet pills Denies issues with sedation/intubation Denies atrial flutter/fib Denies constipation   Emmi instructions given to pt  Pt is aware of Covid safety and care partner requirements.     Patient's pre-visit was done today over the phone with the patient   Name,DOB and address verified.    Patient denies any allergies to Eggs and Soy.   Patient denies any problems with anesthesia/sedation.  Patient denies taking diet pills or blood thinners.   Denies atrial flutter or atrial fib  Denies chronic constipation  No home Oxygen.   Packet of Prep instructions sent by My Chart or mail to patient including a copy of a consent form if by mail-pt is aware.  Patient understands to call us back with any questions or concerns.  Patient is aware of our care-partner policy and TFTDD-22 safety protocol.

## 2021-08-26 ENCOUNTER — Encounter: Payer: Self-pay | Admitting: Internal Medicine

## 2021-08-31 ENCOUNTER — Ambulatory Visit (AMBULATORY_SURGERY_CENTER): Payer: BC Managed Care – PPO | Admitting: Internal Medicine

## 2021-08-31 ENCOUNTER — Other Ambulatory Visit: Payer: Self-pay

## 2021-08-31 ENCOUNTER — Encounter: Payer: Self-pay | Admitting: Internal Medicine

## 2021-08-31 VITALS — BP 127/66 | HR 60 | Temp 98.0°F | Resp 12 | Ht 61.0 in | Wt 144.0 lb

## 2021-08-31 DIAGNOSIS — Z86010 Personal history of colonic polyps: Secondary | ICD-10-CM

## 2021-08-31 DIAGNOSIS — Z800 Family history of malignant neoplasm of digestive organs: Secondary | ICD-10-CM

## 2021-08-31 DIAGNOSIS — D120 Benign neoplasm of cecum: Secondary | ICD-10-CM

## 2021-08-31 DIAGNOSIS — Z1211 Encounter for screening for malignant neoplasm of colon: Secondary | ICD-10-CM | POA: Diagnosis not present

## 2021-08-31 HISTORY — PX: COLONOSCOPY WITH PROPOFOL: SHX5780

## 2021-08-31 MED ORDER — SODIUM CHLORIDE 0.9 % IV SOLN: 500.0000 mL | Freq: Once | INTRAVENOUS | Status: DC

## 2021-08-31 NOTE — Progress Notes (Signed)
PT taken to PACU. Monitors in place. VSS. Report given to RN. 

## 2021-08-31 NOTE — Op Note (Signed)
[facility] ?Patient Name: Tina Williams ?Procedure Date: 08/31/2021 9:38 AM ?MRN: [id number] ?Endoscopist: Gatha Mayer , MD ?Age: 61 ?Referring MD:  ?Date of Birth: 11/09/1960 ?Gender: Female ?Account #: [id number] ?Procedure:                Colonoscopy ?Indications:              Surveillance: Personal history of adenomatous  ?                          polyps on last colonoscopy 5 years ago ?Medicines:                Propofol per Anesthesia, Monitored Anesthesia Care ?Procedure:                Pre-Anesthesia Assessment: ?                          - Prior to the procedure, a History and Physical  ?                          was performed, and patient medications and  ?                          allergies were reviewed. The patient's tolerance of  ?                          previous anesthesia was also reviewed. The risks  ?                          and benefits of the procedure and the sedation  ?                          options and risks were discussed with the patient.  ?                          All questions were answered, and informed consent  ?                          was obtained. Prior Anticoagulants: The patient has  ?                          taken no previous anticoagulant or antiplatelet  ?                          agents. ASA Grade Assessment: II - A patient with  ?                          mild systemic disease. After reviewing the risks  ?                          and benefits, the patient was deemed in  ?                          satisfactory condition to undergo the procedure. ?  After obtaining informed consent, the colonoscope  ?                          was passed under direct vision. Throughout the  ?                          procedure, the patient's blood pressure, pulse, and  ?                          oxygen saturations were monitored continuously. The  ?                          Olympus PCF-H190DL (#[id number]) Colonoscope was  ?                           introduced through the anus and advanced to the the  ?                          cecum, identified by appendiceal orifice and  ?                          ileocecal valve. The colonoscopy was performed  ?                          without difficulty. The patient tolerated the  ?                          procedure well. The quality of the bowel  ?                          preparation was good. The ileocecal valve,  ?                          appendiceal orifice, and rectum were photographed.  ?                          The bowel preparation used was Miralax via split  ?                          dose instruction. ?Scope In: 10:11:41 AM ?Scope Out: 10:29:08 AM ?Scope Withdrawal Time: 0 hours 14 minutes 41 seconds  ?Total Procedure Duration: 0 hours 17 minutes 27 seconds  ?Findings:                 The perianal and digital rectal examinations were  ?                          normal. ?                          Two flat polyps were found in the cecum. The polyps  ?                          were 1 mm in size. These polyps were removed with a  ?  cold biopsy forceps. Resection and retrieval were  ?                          complete. Verification of patient identification  ?                          for the specimen was done. Estimated blood loss was  ?                          minimal. ?                          The exam was otherwise without abnormality on  ?                          direct and retroflexion views. ?Complications:            No immediate complications. ?Estimated Blood Loss:     Estimated blood loss was minimal. ?Impression:               - Two 1 mm polyps in the cecum, removed with a cold  ?                          biopsy forceps. Resected and retrieved. ?                          - The examination was otherwise normal on direct  ?                          and retroflexion views. ?                          - Personal history of colonic polypm adenoma 2017  ?                           and sister had CRCA in 70's. ?Recommendation:           - Patient has a contact number available for  ?                          emergencies. The signs and symptoms of potential  ?                          delayed complications were discussed with the  ?                          patient. Return to normal activities tomorrow.  ?                          Written discharge instructions were provided to the  ?                          patient. ?                          - Resume previous diet. ?                          -  Continue present medications. ?                          - Await pathology results. ?                          - Repeat colonoscopy in 5 years. ?Gatha Mayer, MD ?08/31/2021 10:38:47 AM ?This report has been signed electronically. ?

## 2021-08-31 NOTE — Progress Notes (Signed)
[facility] History and Physical ? ? ?Primary Care Physician:  Midge Minium, MD ? ? ?Reason for Procedure:   Hx adenomatous colon polyp, sister had CRCA ? ?Plan:    colonoscopy ? ? ? ? ?HPI: Tina Williams is a 61 y.o. female w/ hx 8 mm adenoma removed at colonoscopy 2017 ? ? ?Past Medical History:  ?Diagnosis Date  ? Abdominal pain, epigastric 05/13/2014  ? Allergic rhinoconjunctivitis 09/27/2017  ? Allergy   ? ANA positive 11/28/2017  ? Asthma   ? ASTHMA 10/09/2008  ? HFA 75% p coaching  09/27/10    ? Depression   ? Early menopause   ? Fatigue 11/27/2017  ? FOOT PAIN, BILATERAL 10/28/2009  ? Qualifier: Diagnosis of  By: Birdie Riddle MD, Belenda Cruise    ? General medical examination 03/23/2011  ? H/O oralPollinosis 03/04/2015  ? Heart murmur   ? Irregular heart beat per pt and on medication  ? Hx of adenomatous polyp of colon 01/11/2016  ? Hyperlipidemia   ? MENOPAUSE, EARLY 07/09/2008  ? Qualifier: Diagnosis of  By: Ronnald Ramp CMA, Chemira    ? Mild persistent asthma without complication 40/98/1191  ? Moderate recurrent major depression ([place]) 01/06/2014  ? MYALGIA 11/24/2009  ? Qualifier: Diagnosis of  By: Birdie Riddle MD, Belenda Cruise    ? Non-allergic rhinitis   ? Obesity   ? OBESITY 07/09/2008  ? Qualifier: Diagnosis of  By: Birdie Riddle MD, Belenda Cruise    ? Oral allergy syndrome, subsequent encounter 03/04/2015  ? Osteoarthritis   ? Osteoporosis 07/2018  ? T score -2.9  ? Palpitation 11/28/2017  ? Personal history of colonic adenoma  11/26/2009  ? Polyarthralgia 10/25/2012  ? Respiratory tract infection 03/04/2015  ? RHINITIS 10/09/2008  ? Qualifier: Diagnosis of  By: Birdie Riddle MD, Belenda Cruise    ? Sinusitis acute   ? Tinnitus of right ear 09/27/2017  ? URI (upper respiratory infection)   ? Vertigo 09/27/2017  ? Vitamin D deficiency 10/28/2016  ? ? ?Past Surgical History:  ?Procedure Laterality Date  ? [place]  ? COLONOSCOPY  2017  ? COLONOSCOPY WITH PROPOFOL  08/31/2021  ? KNEE ARTHROPLASTY    ? KNEE SURGERY Right  1998  ? [place] EXTRACTION  1984  ? ? ?Prior to Admission medications   ?Medication Sig Start Date End Date Taking? Authorizing Provider  ?alendronate (FOSAMAX) 70 MG tablet Take 1 tablet (70 mg total) by mouth every 7 (seven) days. Take with a full glass of water on an empty stomach. 11/03/20  Yes Midge Minium, MD  ?B Complex Vitamins (VITAMIN B COMPLEX PO) Take by mouth.   Yes [provider]  ?CALCIUM PO Take by mouth.   Yes [provider]  ?Cholecalciferol (VITAMIN D3 PO) Take by mouth.   Yes [provider]  ?DULoxetine (CYMBALTA) 30 MG capsule TAKE 1 CAPSULE BY MOUTH EVERY DAY 03/22/21  Yes Midge Minium, MD  ?flecainide (TAMBOCOR) 50 MG tablet Take 1 tablet (50 mg total) by mouth 2 (two) times daily. 03/22/21  Yes Richardo Priest, MD  ?montelukast (SINGULAIR) 10 MG tablet TAKE 1 TABLET BY MOUTH EVERY DAY 02/15/21  Yes Kozlow, Donnamarie Poag, MD  ?Multiple Vitamin (MULTI-VITAMIN PO) Take by mouth.   Yes [provider]  ?NON FORMULARY Special salt with water.   Yes [provider]  ?Probiotic Product (PROBIOTIC PO) Take by mouth.   Yes [provider]  ?albuterol (PROAIR HFA) 108 (90 Base) MCG/ACT inhaler Inhale 2 puffs into the  lungs every 4 (four) hours as needed for wheezing or shortness of breath. 10/01/20   Kozlow, Donnamarie Poag, MD  ? ? ?Current Outpatient Medications  ?Medication Sig Dispense Refill  ? alendronate (FOSAMAX) 70 MG tablet Take 1 tablet (70 mg total) by mouth every 7 (seven) days. Take with a full glass of water on an empty stomach. 4 tablet 11  ? B Complex Vitamins (VITAMIN B COMPLEX PO) Take by mouth.    ? CALCIUM PO Take by mouth.    ? Cholecalciferol (VITAMIN D3 PO) Take by mouth.    ? DULoxetine (CYMBALTA) 30 MG capsule TAKE 1 CAPSULE BY MOUTH EVERY DAY 90 capsule 2  ? flecainide (TAMBOCOR) 50 MG tablet Take 1 tablet (50 mg total) by mouth 2 (two) times daily. 180 tablet 3  ? montelukast (SINGULAIR) 10 MG tablet TAKE 1 TABLET BY  MOUTH EVERY DAY 90 tablet 2  ? Multiple Vitamin (MULTI-VITAMIN PO) Take by mouth.    ? NON FORMULARY Special salt with water.    ? Probiotic Product (PROBIOTIC PO) Take by mouth.    ? albuterol (PROAIR HFA) 108 (90 Base) MCG/ACT inhaler Inhale 2 puffs into the lungs every 4 (four) hours as needed for wheezing or shortness of breath. 1 each 1  ? ?Current Facility-Administered Medications  ?Medication Dose Route Frequency Provider Last Rate Last Admin  ? 0.9 %  sodium chloride infusion  500 mL Intravenous Once Gatha Mayer, MD      ? ? ?Allergies as of 08/31/2021 - Review Complete 08/31/2021  ?Allergen Reaction Noted  ? Latex Other (See Comments) 06/22/2017  ? Amoxicillin Hives 03/04/2015  ? Clarithromycin    ? Naproxen sodium    ? Penicillins    ? Sulfa antibiotics Hives 11/24/2011  ? ? ?Family History  ?Problem Relation Age of Onset  ? Hypertension Mother   ? Diabetes Mother   ? Emphysema Father   ?     was a smoker  ? Colon polyps Sister   ? Epilepsy Sister   ? Colon polyps Sister   ? Colon cancer Sister 59  ? Lung cancer Sister   ?     was a smoker  ? Diabetes Sister   ? Colon polyps Brother   ? Diabetes Brother   ? Hypertension Brother   ? Colon polyps Brother   ? Healthy Son   ? Healthy Son   ? Heart Problems Son   ?     age 71  ? Healthy Son   ? Esophageal cancer Neg Hx   ? Rectal cancer Neg Hx   ? Stomach cancer Neg Hx   ? ? ?Social History  ? ?Socioeconomic History  ? Marital status: Married  ?  Spouse name: Not on file  ? Number of children: 3  ? Years of education: Not on file  ? Highest education level: Not on file  ?Occupational History  ? Occupation: Teacher's Aid  ?Tobacco Use  ? Smoking status: Former  ?  Packs/day: 0.50  ?  Years: 18.00  ?  Pack years: 9.00  ?  Types: Cigarettes  ?  Quit date: 06/28/1999  ?  Years since quitting: 22.1  ? Smokeless tobacco: Never  ?Vaping Use  ? Vaping Use: Never used  ?Substance and Sexual Activity  ? Alcohol use: Yes  ?  Alcohol/week: 7.0 standard drinks  ?  Types:  7 Glasses of wine per week  ?  Comment: weekly  ? Drug use: Never  ?  Sexual activity: Yes  ?  Birth control/protection: Post-menopausal  ?  Comment: 1st intercourse 61 yo-Fewer than 5 partners  ?Other Topics Concern  ? ? ? ?Review of Systems: ? ?other review of systems negative except as mentioned in the HPI. ? ?Physical Exam: ?Vital signs ?BP 132/74   Pulse 76   Temp 98 ?F (36.7 ?C) (Temporal)   Ht '5\' 1"'$  (1.549 m)   Wt 144 lb (65.3 kg)   SpO2 100%   BMI 27.21 kg/m?  ? ?General:   Alert,  Well-developed, well-nourished, pleasant and cooperative in NAD ?Lungs:  Clear throughout to auscultation.   ?Heart:  Regular rate and rhythm; no murmurs, clicks, rubs,  or gallops. ?Abdomen:  Soft, nontender and nondistended. Normal bowel sounds.   ?Neuro/Psych:  Alert and cooperative. Normal mood and affect. A and O x 3 ? ? ?'@[employer]'$  Simonne Maffucci, MD, Mar[facility] Regal ?[facility] ?[phone] (pager) ?08/31/2021 9:43 AM@ ? ?

## 2021-08-31 NOTE — Progress Notes (Signed)
Pt's states no medical or surgical changes since previsit or office visit.  ° °Vitals CW °

## 2021-08-31 NOTE — Patient Instructions (Addendum)
I found and removed 2 very tiny polyps. ? ?I will let you know pathology results. ? ?Your next routine colonoscopy should be in 5 years - 2028. ? ? ?I appreciate the opportunity to care for you. ?Gatha Mayer, MD, [facility] ? ?Please read handouts provided. ?Continue present medications. ?Await pathology results. ?Repeat colonoscopy in 5 years. ? ? ?YOU HAD AN ENDOSCOPIC PROCEDURE TODAY AT [facility]:   Refer to the procedure report that was given to you for any specific questions about what was found during the examination.  If the procedure report does not answer your questions, please call your gastroenterologist to clarify.  If you requested that your care partner not be given the details of your procedure findings, then the procedure report has been included in a sealed envelope for you to review at your convenience later. ? ?YOU SHOULD EXPECT: Some feelings of bloating in the abdomen. Passage of more gas than usual.  Walking can help get rid of the air that was put into your GI tract during the procedure and reduce the bloating. If you had a lower endoscopy (such as a colonoscopy or flexible sigmoidoscopy) you may notice spotting of blood in your stool or on the toilet paper. If you underwent a bowel prep for your procedure, you may not have a normal bowel movement for a few days. ? ?Please Note:  You might notice some irritation and congestion in your nose or some drainage.  This is from the oxygen used during your procedure.  There is no need for concern and it should clear up in a day or so. ? ?SYMPTOMS TO REPORT IMMEDIATELY: ? ?Following lower endoscopy (colonoscopy or flexible sigmoidoscopy): ? Excessive amounts of blood in the stool ? Significant tenderness or worsening of abdominal pains ? Swelling of the abdomen that is new, acute ? Fever of 100?F or higher ? ? ?For urgent or emergent issues, a gastroenterologist can be reached at any hour by calling [phone]. ?Do not use MyChart  messaging for urgent concerns.  ? ? ?DIET:  We do recommend a small meal at first, but then you may proceed to your regular diet.  Drink plenty of fluids but you should avoid alcoholic beverages for 24 hours. ? ?ACTIVITY:  You should plan to take it easy for the rest of today and you should NOT DRIVE or use heavy machinery until tomorrow (because of the sedation medicines used during the test).   ? ?FOLLOW UP: ?Our staff will call the number listed on your records 48-72 hours following your procedure to check on you and address any questions or concerns that you may have regarding the information given to you following your procedure. If we do not reach you, we will leave a message.  We will attempt to reach you two times.  During this call, we will ask if you have developed any symptoms of COVID 19. If you develop any symptoms (ie: fever, flu-like symptoms, shortness of breath, cough etc.) before then, please call [phone].  If you test positive for Covid 19 in the 2 weeks post procedure, please call and report this information to [place].   ? ?If any biopsies were taken you will be contacted by phone or by letter within the next 1-3 weeks.  Please call us at [phone] if you have not heard about the biopsies in 3 weeks.  ? ? ?SIGNATURES/CONFIDENTIALITY: ?You and/or your care partner have signed paperwork which will be entered into your  electronic medical record.  These signatures attest to the fact that that the information above on your After Visit Summary has been reviewed and is understood.  Full responsibility of the confidentiality of this discharge information lies with you and/or your care-partner.  ?

## 2021-08-31 NOTE — Progress Notes (Signed)
Called to room to assist during endoscopic procedure.  Patient ID and intended procedure confirmed with present staff. Received instructions for my participation in the procedure from the performing physician.  

## 2021-09-02 ENCOUNTER — Telehealth: Payer: Self-pay

## 2021-09-02 NOTE — Telephone Encounter (Signed)
?  Follow up Call- ? ?Call back number 08/31/2021  ?Post procedure Call Back phone  # [phone]  ?Permission to leave phone message Yes  ?Some recent data might be hidden  ?  ? ?Patient questions: ? ?Do you have a fever, pain , or abdominal swelling? No. ?Pain Score  0 * ? ?Have you tolerated food without any problems? Yes.   ? ?Have you been able to return to your normal activities? Yes.   ? ?Do you have any questions about your discharge instructions: ?Diet   No. ?Medications  No. ?Follow up visit  No. ? ?Do you have questions or concerns about your Care? No. ? ?Actions: ?* If pain score is 4 or above: ?No action needed, pain <4. ? ?Have you developed a fever since your procedure? no ? ?2.   Have you had an respiratory symptoms (SOB or cough) since your procedure? no ? ?3.   Have you tested positive for COVID 19 since your procedure no ? ?4.   Have you had any family members/close contacts diagnosed with the COVID 19 since your procedure?  no ? ? ?If yes to any of these questions please route to Joylene John, RN and Joella Prince, RN  ? ? ?

## 2021-09-07 ENCOUNTER — Encounter: Payer: Self-pay | Admitting: Internal Medicine

## 2021-09-29 ENCOUNTER — Encounter: Payer: Self-pay | Admitting: Allergy and Immunology

## 2021-09-29 ENCOUNTER — Ambulatory Visit: Payer: BC Managed Care – PPO | Admitting: Allergy and Immunology

## 2021-09-29 VITALS — BP 118/72 | HR 74 | Resp 16 | Ht 61.0 in | Wt 148.8 lb

## 2021-09-29 DIAGNOSIS — J4530 Mild persistent asthma, uncomplicated: Secondary | ICD-10-CM

## 2021-09-29 DIAGNOSIS — J3089 Other allergic rhinitis: Secondary | ICD-10-CM | POA: Diagnosis not present

## 2021-09-29 DIAGNOSIS — T781XXD Other adverse food reactions, not elsewhere classified, subsequent encounter: Secondary | ICD-10-CM

## 2021-09-29 NOTE — Progress Notes (Signed)
? ?[place] ? ? ?Follow-up Note ? ?Referring Provider: Midge Minium, MD ?Primary Provider: Midge Minium, MD ?Date of Office Visit: 09/29/2021 ? ?Subjective:  ? ?Tina Williams (DOB: Sep 07, 1960) is a 61 y.o. female who returns to the [facility] on 09/29/2021 in re-evaluation of the following: ? ?HPI: Tina Williams returns to this clinic in evaluation of asthma, allergic rhinitis, oral allergy syndrome.  I last saw her in this clinic on 28 September 2020. ? ?She has had an excellent year regarding her airway without the need for systemic steroid or antibiotic and rare use of a short acting bronchodilator and no limitation on ability to exercise. ? ?During the spring and fall she has been a little bit careful about eating fresh fruits and vegetables because of her oral allergy syndrome and oral symptoms.  As long as she cooks these food she does well. ? ?She uses montelukast and cetirizine on a regular basis which works very well for her and overall she believes that her current plan is working as she expects. ? ?Allergies as of 09/29/2021   ? ?   Reactions  ? Latex Other (See Comments)  ? Throat swelling, chest tightness  ? Amoxicillin Hives  ? Clarithromycin   ? REACTION: hives  ? Naproxen Sodium   ? REACTION: HIVES  ? Penicillins   ? REACTION: hives  ? Sulfa Antibiotics Hives  ? ?  ? ?  ?Medication List  ? ? ?albuterol 108 (90 Base) MCG/ACT inhaler ?Commonly known as: ProAir HFA ?Inhale 2 puffs into the lungs every 4 (four) hours as needed for wheezing or shortness of breath. ?  ?alendronate 70 MG tablet ?Commonly known as: FOSAMAX ?Take 1 tablet (70 mg total) by mouth every 7 (seven) days. Take with a full glass of water on an empty stomach. ?  ?CALCIUM PO ?Take by mouth. ?  ?DULoxetine 30 MG capsule ?Commonly known as: CYMBALTA ?TAKE 1 CAPSULE BY MOUTH EVERY DAY ?  ?flecainide 50 MG tablet ?Commonly known as: TAMBOCOR ?Take 1 tablet (50 mg total) by mouth  2 (two) times daily. ?  ?montelukast 10 MG tablet ?Commonly known as: SINGULAIR ?TAKE 1 TABLET BY MOUTH EVERY DAY ?  ?MULTI-VITAMIN PO ?Take by mouth. ?  ?NON FORMULARY ?Special salt with water. ?  ?PROBIOTIC PO ?Take by mouth. ?  ?VITAMIN B COMPLEX PO ?Take by mouth. ?  ?VITAMIN D3 PO ?Take by mouth. ?  ? ?Past Medical History:  ?Diagnosis Date  ? Abdominal pain, epigastric 05/13/2014  ? Allergic rhinoconjunctivitis 09/27/2017  ? Allergy   ? ANA positive 11/28/2017  ? Asthma   ? ASTHMA 10/09/2008  ? HFA 75% p coaching  09/27/10    ? Depression   ? Early menopause   ? Fatigue 11/27/2017  ? FOOT PAIN, BILATERAL 10/28/2009  ? Qualifier: Diagnosis of  By: Birdie Riddle MD, Belenda Cruise    ? General medical examination 03/23/2011  ? H/O oralPollinosis 03/04/2015  ? Heart murmur   ? Irregular heart beat per pt and on medication  ? Hx of adenomatous polyp of colon 01/11/2016  ? Hyperlipidemia   ? MENOPAUSE, EARLY 07/09/2008  ? Qualifier: Diagnosis of  By: Ronnald Ramp CMA, Chemira    ? Mild persistent asthma without complication 75/64/3329  ? Moderate recurrent major depression ([place]) 01/06/2014  ? MYALGIA 11/24/2009  ? Qualifier: Diagnosis of  By: Birdie Riddle MD, Belenda Cruise    ? Non-allergic rhinitis   ? Obesity   ?  OBESITY 07/09/2008  ? Qualifier: Diagnosis of  By: Birdie Riddle MD, Belenda Cruise    ? Oral allergy syndrome, subsequent encounter 03/04/2015  ? Osteoarthritis   ? Osteoporosis 07/2018  ? T score -2.9  ? Palpitation 11/28/2017  ? Personal history of colonic adenoma  11/26/2009  ? Polyarthralgia 10/25/2012  ? Respiratory tract infection 03/04/2015  ? RHINITIS 10/09/2008  ? Qualifier: Diagnosis of  By: Birdie Riddle MD, Belenda Cruise    ? Sinusitis acute   ? Tinnitus of right ear 09/27/2017  ? URI (upper respiratory infection)   ? Vertigo 09/27/2017  ? Vitamin D deficiency 10/28/2016  ? ? ?Past Surgical History:  ?Procedure Laterality Date  ? [place]  ? COLONOSCOPY  2017  ? COLONOSCOPY WITH PROPOFOL  08/31/2021  ? KNEE ARTHROPLASTY    ? KNEE  SURGERY Right 1998  ? [place] EXTRACTION  1984  ? ? ?Review of systems negative except as noted in HPI / PMHx or noted below: ? ?Review of Systems  ?Constitutional: Negative.   ?HENT: Negative.    ?Eyes: Negative.   ?Respiratory: Negative.    ?Cardiovascular: Negative.   ?Gastrointestinal: Negative.   ?Genitourinary: Negative.   ?Musculoskeletal: Negative.   ?Skin: Negative.   ?Neurological: Negative.   ?Endo/Heme/Allergies: Negative.   ?Psychiatric/Behavioral: Negative.    ? ? ?Objective:  ? ?Vitals:  ? 09/29/21 1059  ?BP: 118/72  ?Pulse: 74  ?Resp: 16  ?SpO2: 98%  ? ?Height: '5\' 1"'$  (154.9 cm)  ?Weight: 148 lb 12.8 oz (67.5 kg)  ? ?Physical Exam ?Constitutional:   ?   Appearance: She is not diaphoretic.  ?HENT:  ?   Head: Normocephalic.  ?   Right Ear: Tympanic membrane, ear canal and external ear normal.  ?   Left Ear: Tympanic membrane, ear canal and external ear normal.  ?   Nose: Nose normal. No mucosal edema or rhinorrhea.  ?   Mouth/Throat:  ?   Pharynx: Uvula midline. No oropharyngeal exudate.  ?Eyes:  ?   Conjunctiva/sclera: Conjunctivae normal.  ?Neck:  ?   Thyroid: No thyromegaly.  ?   Trachea: Trachea normal. No tracheal tenderness or tracheal deviation.  ?Cardiovascular:  ?   Rate and Rhythm: Normal rate and regular rhythm.  ?   Heart sounds: Normal heart sounds, S1 normal and S2 normal. No murmur heard. ?Pulmonary:  ?   Effort: No respiratory distress.  ?   Breath sounds: Normal breath sounds. No stridor. No wheezing or rales.  ?Lymphadenopathy:  ?   Head:  ?   Right side of head: No tonsillar adenopathy.  ?   Left side of head: No tonsillar adenopathy.  ?   Cervical: No cervical adenopathy.  ?Skin: ?   Findings: No erythema or rash.  ?   Nails: There is no clubbing.  ?Neurological:  ?   Mental Status: She is alert.  ? ? ?Diagnostics: none ? ?Assessment and Plan:  ? ?1. Asthma, well controlled, mild persistent   ?2. Perennial allergic rhinitis   ?3. Oral allergy syndrome, subsequent encounter   ? ?   ?1. Continue Montelukast '10mg'$  one tablet one time per day ? ?2. Continue zyrtec 10 mg - 1 tablet 1 time per day ? ?3. Continue Proair HFA if needed ? ?4. Continue Epi-Pen if needed ? ?5. Return in 12 months or earlier if problem ? ?Tina Williams is really doing very well and her current therapy appears to be working to provide good control of her asthma and allergic rhinitis.  She has a very good understanding of her disease state and how her medications work and appropriate dosing of her medications.  We will see her back in this clinic in 1 year or earlier if there is a problem. ? ?Allena Katz, MD ?Allergy / Immunology ?[place] ?

## 2021-09-29 NOTE — Patient Instructions (Signed)
?  1. Continue Montelukast '10mg'$  one tablet one time per day ? ?2. Continue zyrtec 10 mg - 1 tablet 1 time per day ? ?3. Continue Proair HFA if needed ? ?4. Continue Epi-Pen if needed ? ?5. Return in 12 months or earlier if problem ? ?  ? ?  ?

## 2021-09-30 ENCOUNTER — Encounter: Payer: Self-pay | Admitting: Allergy and Immunology

## 2021-10-13 ENCOUNTER — Ambulatory Visit (INDEPENDENT_AMBULATORY_CARE_PROVIDER_SITE_OTHER): Payer: BC Managed Care – PPO

## 2021-10-13 ENCOUNTER — Encounter: Payer: Self-pay | Admitting: Podiatry

## 2021-10-13 ENCOUNTER — Ambulatory Visit: Payer: BC Managed Care – PPO | Admitting: Podiatry

## 2021-10-13 DIAGNOSIS — M21619 Bunion of unspecified foot: Secondary | ICD-10-CM

## 2021-10-13 DIAGNOSIS — M722 Plantar fascial fibromatosis: Secondary | ICD-10-CM | POA: Diagnosis not present

## 2021-10-13 MED ORDER — TRIAMCINOLONE ACETONIDE 10 MG/ML IJ SUSP
10.0000 mg | Freq: Once | INTRAMUSCULAR | Status: AC
  Administered 2021-10-13: 10 mg

## 2021-10-13 NOTE — Progress Notes (Signed)
Subjective:  ? ?Patient ID: Tina Williams, female   DOB: 61 y.o.   MRN: [id number]  ? ?HPI ?Patient states she has a lot of pain left over right within the mid arch area and also has developed pain in the outside of her left foot.  States its been hurting underneath for a long time worse over the last 6 months and the outside has been for a number of months.  Patient does not smoke likes to be active and works on [employer] ? ? ?Review of Systems  ?All other systems reviewed and are negative. ? ? ?   ?Objective:  ?Physical Exam ?Vitals and nursing note reviewed.  ?Constitutional:   ?   Appearance: She is well-developed.  ?Pulmonary:  ?   Effort: Pulmonary effort is normal.  ?Musculoskeletal:     ?   General: Normal range of motion.  ?Skin: ?   General: Skin is warm.  ?Neurological:  ?   Mental Status: She is alert.  ?  ?Neurovascular status intact muscle strength adequate range of motion within normal limits with inflammation and nodular formation of the mid arch left painful when pressed mild on the right without the nodular formation and inflammation on the lateral side of the left foot with good digital perfusion well oriented x3 ? ?   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 ?   ?Plan:  ?H&P conditions reviewed and discussed and explained its possible we may need to strip this arch and I also noted bunion which may need to be corrected at same time as it is moderately symptomatic.  Today I did do sterile prep injected the mid arch 3 mg Kenalog 5 mg Xylocaine and I want to see him back again in 4 weeks or earlier if necessary and applied fascial brace to lift up the arch and try to take tension off the mid arch area ? ?X-rays indicate structural bunion deformity bilateral no indications of calcification of the fascial band left over right ?   ? ? ?

## 2021-10-13 NOTE — Patient Instructions (Signed)

## 2021-10-20 ENCOUNTER — Encounter: Payer: Self-pay | Admitting: Family Medicine

## 2021-10-20 ENCOUNTER — Ambulatory Visit (INDEPENDENT_AMBULATORY_CARE_PROVIDER_SITE_OTHER): Payer: BC Managed Care – PPO | Admitting: Family Medicine

## 2021-10-20 VITALS — BP 112/80 | HR 70 | Temp 97.6°F | Resp 16 | Ht 61.0 in | Wt 146.4 lb

## 2021-10-20 DIAGNOSIS — E785 Hyperlipidemia, unspecified: Secondary | ICD-10-CM

## 2021-10-20 DIAGNOSIS — Z0000 Encounter for general adult medical examination without abnormal findings: Secondary | ICD-10-CM | POA: Diagnosis not present

## 2021-10-20 DIAGNOSIS — M810 Age-related osteoporosis without current pathological fracture: Secondary | ICD-10-CM | POA: Diagnosis not present

## 2021-10-20 LAB — CBC WITH DIFFERENTIAL/PLATELET
Basophils Absolute: 0 10*3/uL (ref 0.0–0.1)
Basophils Relative: 0.8 % (ref 0.0–3.0)
Eosinophils Absolute: 0.3 10*3/uL (ref 0.0–0.7)
Eosinophils Relative: 5.7 % — ABNORMAL HIGH (ref 0.0–5.0)
HCT: 40.5 % (ref 36.0–46.0)
Hemoglobin: 13.1 g/dL (ref 12.0–15.0)
Lymphocytes Relative: 26.4 % (ref 12.0–46.0)
Lymphs Abs: 1.5 10*3/uL (ref 0.7–4.0)
MCHC: 32.3 g/dL (ref 30.0–36.0)
MCV: 86.7 fl (ref 78.0–100.0)
Monocytes Absolute: 0.5 10*3/uL (ref 0.1–1.0)
Monocytes Relative: 9.7 % (ref 3.0–12.0)
Neutro Abs: 3.1 10*3/uL (ref 1.4–7.7)
Neutrophils Relative %: 57.4 % (ref 43.0–77.0)
Platelets: 415 10*3/uL — ABNORMAL HIGH (ref 150.0–400.0)
RBC: 4.67 Mil/uL (ref 3.87–5.11)
RDW: 14.6 % (ref 11.5–15.5)
WBC: 5.5 10*3/uL (ref 4.0–10.5)

## 2021-10-20 LAB — BASIC METABOLIC PANEL
BUN: 17 mg/dL (ref 6–23)
CO2: 30 mEq/L (ref 19–32)
Calcium: 9.3 mg/dL (ref 8.4–10.5)
Chloride: 105 mEq/L (ref 96–112)
Creatinine, Ser: 0.59 mg/dL (ref 0.40–1.20)
GFR: 97.65 mL/min (ref >60.00)
Glucose, Bld: 91 mg/dL (ref 70–99)
Potassium: 4.8 mEq/L (ref 3.5–5.1)
Sodium: 142 mEq/L (ref 135–145)

## 2021-10-20 LAB — LIPID PANEL
Cholesterol: 210 mg/dL — ABNORMAL HIGH (ref 0–200)
HDL: 74 mg/dL (ref >39.00)
LDL Cholesterol: 125 mg/dL — ABNORMAL HIGH (ref 0–99)
NonHDL: 136.45
Total CHOL/HDL Ratio: 3
Triglycerides: 55 mg/dL (ref 0.0–149.0)
VLDL: 11 mg/dL (ref 0.0–40.0)

## 2021-10-20 LAB — TSH: TSH: 2.17 u[IU]/mL (ref 0.35–5.50)

## 2021-10-20 LAB — HEPATIC FUNCTION PANEL
ALT: 12 U/L (ref 0–35)
AST: 14 U/L (ref 0–37)
Albumin: 4.4 g/dL (ref 3.5–5.2)
Alkaline Phosphatase: 51 U/L (ref 39–117)
Bilirubin, Direct: 0.1 mg/dL (ref 0.0–0.3)
Total Bilirubin: 0.7 mg/dL (ref 0.2–1.2)
Total Protein: 6.5 g/dL (ref 6.0–8.3)

## 2021-10-20 LAB — VITAMIN D 25 HYDROXY (VIT D DEFICIENCY, FRACTURES): VITD: 62.43 ng/mL (ref 30.00–100.00)

## 2021-10-20 NOTE — Progress Notes (Signed)
? ?  Subjective:  ? ? Patient ID: Tina Williams, female    DOB: [birth year], 61 y.o.   MRN: [id number] ? ?HPI ?CPE- UTD on pap, mammo, colonoscopy, Tdap  Pt reports feeling good. ? ?Patient Care Team  ?  Relationship Specialty Notifications Start End  ?Midge Minium, MD PCP - General   06/09/10   ?Gatha Mayer, MD Consulting Physician Gastroenterology  10/23/15   ?Molli Posey, MD Consulting Physician Obstetrics and Gynecology  10/23/15   ?Jiles Prows, MD Consulting Physician Allergy and Immunology  10/28/16   ?  ?Health Maintenance  ?Topic Date Due  ? PAP SMEAR-Modifier  08/01/2021  ? MAMMOGRAM  11/02/2021  ? INFLUENZA VACCINE  01/25/2022  ? TETANUS/TDAP  10/22/2025  ? COLONOSCOPY (Pts 45-36yr Insurance coverage will need to be confirmed)  09/01/2026  ? Hepatitis C Screening  Completed  ? HIV Screening  Completed  ? HPV VACCINES  Aged Out  ? COVID-19 Vaccine  Discontinued  ? Zoster Vaccines- Shingrix  Discontinued  ?  ? ? ?Review of Systems ?Patient reports no vision/ hearing changes, adenopathy,fever, weight change,  persistant/recurrent hoarseness , swallowing issues, chest pain, palpitations, edema, persistant/recurrent cough, hemoptysis, dyspnea (rest/exertional/paroxysmal nocturnal), gastrointestinal bleeding (melena, rectal bleeding), abdominal pain, significant heartburn, bowel changes, GU symptoms (dysuria, hematuria, incontinence), Gyn symptoms (abnormal  bleeding, pain),  syncope, focal weakness, memory loss, numbness & tingling, skin/hair/nail changes, abnormal bruising or bleeding, anxiety, or depression.  ?   ?Objective:  ? Physical Exam ?General Appearance:    Alert, cooperative, no distress, appears stated age  ?Head:    Normocephalic, without obvious abnormality, atraumatic  ?Eyes:    PERRL, conjunctiva/corneas clear, EOM's intact both eyes  ?Ears:    Normal TM's and external ear canals, both ears  ?Nose:   Nares normal, septum midline, mucosa normal, no drainage  ?  or sinus tenderness   ?Throat:   Lips, mucosa, and tongue normal; teeth and gums normal  ?Neck:   Supple, symmetrical, trachea midline, no adenopathy;  ?  Thyroid: no enlargement/tenderness/nodules  ?Back:     Symmetric, no curvature, ROM normal, no CVA tenderness  ?Lungs:     Clear to auscultation bilaterally, respirations unlabored  ?Chest Wall:    No tenderness or deformity  ? Heart:    Regular rate and rhythm, S1 and S2 normal, no murmur, rub ?  or gallop  ?Breast Exam:    Deferred to GYN  ?Abdomen:     Soft, non-tender, bowel sounds active all four quadrants,  ?  no masses, no organomegaly  ?Genitalia:    Deferred to GYN  ?Rectal:    ?Extremities:   Extremities normal, atraumatic, no cyanosis or edema  ?Pulses:   2+ and symmetric all extremities  ?Skin:   Skin color, texture, turgor normal, no rashes or lesions  ?Lymph nodes:   Cervical, supraclavicular, and axillary nodes normal  ?Neurologic:   CNII-XII intact, normal strength, sensation and reflexes  ?  throughout  ?  ? ? ? ?   ?Assessment & Plan:  ? ? ?

## 2021-10-20 NOTE — Assessment & Plan Note (Signed)
Ongoing issue for pt.  UTD on DEXA.  Currently on Fosamax.  Check Vit D and replete prn. ?

## 2021-10-20 NOTE — Assessment & Plan Note (Signed)
Pt's PE WNL.  UTD on pap, mammo, DEXA, colonoscopy.  UTD on Tdap.  Check labs.  Anticipatory guidance provided.  ?

## 2021-10-20 NOTE — Patient Instructions (Signed)
Follow up in 1 year or as needed ?We'll notify you of your lab results and make any changes if needed ?Continue to work on healthy diet and exercise as you are able ?Call with any questions or concerns ?Stay Safe!  Stay Healthy! ?Happy Spring!!!! ?

## 2021-10-20 NOTE — Assessment & Plan Note (Signed)
Chronic problem.  Attempting to control w/ diet and exercise.  Check labs and determine if meds are needed. 

## 2021-10-24 ENCOUNTER — Other Ambulatory Visit: Payer: Self-pay | Admitting: Family Medicine

## 2021-10-25 HISTORY — PX: BUNIONECTOMY: SHX129

## 2021-11-10 ENCOUNTER — Ambulatory Visit: Payer: BC Managed Care – PPO | Admitting: Podiatry

## 2021-11-10 ENCOUNTER — Telehealth: Payer: Self-pay | Admitting: Urology

## 2021-11-10 ENCOUNTER — Encounter: Payer: Self-pay | Admitting: Podiatry

## 2021-11-10 DIAGNOSIS — M722 Plantar fascial fibromatosis: Secondary | ICD-10-CM

## 2021-11-10 DIAGNOSIS — M21619 Bunion of unspecified foot: Secondary | ICD-10-CM

## 2021-11-10 NOTE — Telephone Encounter (Signed)
DOS - 11/23/21 ? ?[place] BUNIONECTOMY LEFT --- [phone] ?PLANTAR  FIBROMA LEFT --- [id number] ? ?[employer] EFFECTIVE DATE - 06/27/16 ? ?PLAN DEDUCTIBLE - $0.00 ?OUT OF POCKET - $4,000.00 W/ $3,146.00 REMAINING ?COINSURANCE - 0% ?COPAY - $0.00 ? ?PER [employer] AUTOMATIVE SYSTEM FOR CPT CODES 36122 AND 44975 NO PRIOR AUTH IS REQUIRED.  ? ? ?REF # [id number] ? ?

## 2021-11-10 NOTE — Progress Notes (Signed)
Subjective:  ? ?Patient ID: Tina Williams, female   DOB: 61 y.o.   MRN: [id number]  ? ?HPI ?Patient presents stating that her arch has not responded that the injection did not help its been going on for a long time getting worse and she has a knot that she is walked on it she would like it removed.  Also would like her bunion fixed ? ? ?ROS ? ? ?   ?Objective:  ?Physical Exam  ?Neurovascular status intact with structural bunion deformity left and right that are symptomatic and has tried wider shoes soaks and padding along with what appears to be a thickening with probable plantar fibroma of the plantar mid arch area left that is very painful when pressed and did not respond conservatively ? ?   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 ?   ?Plan:  ?H&P all conditions reviewed discussed and she has opted for surgical intervention.  I allowed her to read a consent form going over surgery alternative treatments complications and the removal of the plantar fibroma.  She is amendable to this and after extensive review signed consent form for that and bunion correction with distal osteotomy and I did go over all possible complications the fact total recovery will take around 6 months and she will be in a boot for at least 4 weeks which was dispensed today as I want her to get used to it prior to surgery advised shoes that work well with it.  Patient was scheduled for procedure encouraged to call any questions concerns which may come up ?   ? ? ?

## 2021-11-14 ENCOUNTER — Other Ambulatory Visit: Payer: Self-pay | Admitting: Allergy and Immunology

## 2021-11-16 DIAGNOSIS — M79676 Pain in unspecified toe(s): Secondary | ICD-10-CM

## 2021-11-23 ENCOUNTER — Encounter: Payer: Self-pay | Admitting: Podiatry

## 2021-11-23 ENCOUNTER — Telehealth: Payer: Self-pay | Admitting: Podiatry

## 2021-11-23 ENCOUNTER — Other Ambulatory Visit: Payer: Self-pay | Admitting: Podiatry

## 2021-11-23 DIAGNOSIS — D492 Neoplasm of unspecified behavior of bone, soft tissue, and skin: Secondary | ICD-10-CM | POA: Diagnosis not present

## 2021-11-23 DIAGNOSIS — M2012 Hallux valgus (acquired), left foot: Secondary | ICD-10-CM | POA: Diagnosis not present

## 2021-11-23 DIAGNOSIS — D1724 Benign lipomatous neoplasm of skin and subcutaneous tissue of left leg: Secondary | ICD-10-CM | POA: Diagnosis not present

## 2021-11-23 DIAGNOSIS — M722 Plantar fascial fibromatosis: Secondary | ICD-10-CM | POA: Diagnosis not present

## 2021-11-23 HISTORY — PX: FOOT SURGERY: SHX648

## 2021-11-23 MED ORDER — ONDANSETRON HCL 4 MG PO TABS: 4.0000 mg | ORAL_TABLET | Freq: Three times a day (TID) | ORAL | 0 refills | Status: DC | PRN

## 2021-11-23 MED ORDER — OXYCODONE-ACETAMINOPHEN 10-325 MG PO TABS: 1.0000 | ORAL_TABLET | Freq: Four times a day (QID) | ORAL | 0 refills | Status: DC | PRN

## 2021-11-23 NOTE — Telephone Encounter (Signed)
Patient called back to confirm that we would send Medication to [facility].  Please advise

## 2021-11-23 NOTE — Telephone Encounter (Signed)
error 

## 2021-11-23 NOTE — Addendum Note (Signed)
Addended by: Wallene Huh on: 11/23/2021 06:58 AM   Modules accepted: Orders

## 2021-11-23 NOTE — Telephone Encounter (Signed)
Called and spoke with husband, notified that medication has been sent to the [facility] on file. Confirmed that  the prescription was there.

## 2021-11-23 NOTE — Telephone Encounter (Signed)
Patient's husband has been notified and have contacted [facility] to make sure that they received.

## 2021-11-23 NOTE — Telephone Encounter (Signed)
Patient is calling because [facility] [place] does [facility] percocet 10-325 mg in stock, please resend [id number] on file. Called and they do have in stock. Please advise.

## 2021-11-23 NOTE — Telephone Encounter (Signed)
Pts husband called stating [facility] [place] [facility] not [facility] pts pain medication in stock. [facility] [place] [facility] [address].

## 2021-11-24 ENCOUNTER — Telehealth: Payer: Self-pay

## 2021-11-25 NOTE — Telephone Encounter (Signed)
Post-op call

## 2021-11-29 ENCOUNTER — Encounter: Payer: Self-pay | Admitting: Podiatry

## 2021-11-29 ENCOUNTER — Ambulatory Visit (INDEPENDENT_AMBULATORY_CARE_PROVIDER_SITE_OTHER): Payer: BC Managed Care – PPO

## 2021-11-29 ENCOUNTER — Ambulatory Visit (INDEPENDENT_AMBULATORY_CARE_PROVIDER_SITE_OTHER): Payer: BC Managed Care – PPO | Admitting: Podiatry

## 2021-11-29 DIAGNOSIS — Z9889 Other specified postprocedural states: Secondary | ICD-10-CM | POA: Diagnosis not present

## 2021-11-29 NOTE — Progress Notes (Signed)
Subjective:   Patient ID: Tina Williams, female   DOB: 61 y.o.   MRN: [id number]   HPI Patient states doing excellent with surgery very pleased so far and is still nonweightbearing neuro   ROS      Objective:  Physical Exam  Vascular status intact negative Bevelyn Buckles' sign noted wound edges well coapted first MPJ left and plantar arch where a plantar fibroma was excised or other soft tissue mass     Assessment:  Doing well post foot surgery left     Plan:  X-ray reviewed sterile dressing reapplied continue elevation compression immobilization and for the most part no walking on the foot reappoint 2 weeks suture removal earlier if needed  X-rays indicate satisfactory position of the first metatarsal good alignment noted fixation in place

## 2021-12-13 ENCOUNTER — Encounter: Payer: Self-pay | Admitting: Podiatry

## 2021-12-15 ENCOUNTER — Encounter: Payer: Self-pay | Admitting: Podiatry

## 2021-12-15 ENCOUNTER — Ambulatory Visit: Payer: BC Managed Care – PPO | Admitting: Podiatry

## 2021-12-15 ENCOUNTER — Ambulatory Visit (INDEPENDENT_AMBULATORY_CARE_PROVIDER_SITE_OTHER): Payer: BC Managed Care – PPO

## 2021-12-15 DIAGNOSIS — Z9889 Other specified postprocedural states: Secondary | ICD-10-CM

## 2021-12-15 NOTE — Progress Notes (Signed)
Subjective:   Patient ID: Tina Williams, female   DOB: 61 y.o.   MRN: [id number]   HPI Patient presents stating doing very well with surgery very pleased stitches intact plantarly   ROS      Objective:  Physical Exam  Neurovascular status intact negative Bevelyn Buckles' sign noted wound edges well coapted stitches intact plantar first MPJ doing well excellent range of motion mild swelling     Assessment:  Doing well post osteotomy post plantar fibroma resection left     Plan:  Stitches removed plantar left incision well coapted reapplied sterile dressing ankle compression stocking dispensed surgical shoe dispensed continue immobilization 2 more weeks then gradual return to soft shoe gear reappoint 4 weeks final visit  X-rays indicate osteotomies healing well fixation in place joint congruence

## 2021-12-30 ENCOUNTER — Other Ambulatory Visit: Payer: Self-pay | Admitting: Family Medicine

## 2022-01-05 ENCOUNTER — Other Ambulatory Visit: Payer: Self-pay | Admitting: Podiatry

## 2022-01-05 MED ORDER — OXYCODONE-ACETAMINOPHEN 10-325 MG PO TABS: 1.0000 | ORAL_TABLET | Freq: Three times a day (TID) | ORAL | 0 refills | Status: AC | PRN

## 2022-01-05 NOTE — Telephone Encounter (Signed)
Sent prescription in.

## 2022-01-12 ENCOUNTER — Encounter: Payer: Self-pay | Admitting: Podiatry

## 2022-01-12 ENCOUNTER — Ambulatory Visit (INDEPENDENT_AMBULATORY_CARE_PROVIDER_SITE_OTHER): Payer: BC Managed Care – PPO

## 2022-01-12 ENCOUNTER — Ambulatory Visit (INDEPENDENT_AMBULATORY_CARE_PROVIDER_SITE_OTHER): Payer: BC Managed Care – PPO | Admitting: Podiatry

## 2022-01-12 DIAGNOSIS — Z9889 Other specified postprocedural states: Secondary | ICD-10-CM | POA: Diagnosis not present

## 2022-01-12 MED ORDER — DICLOFENAC SODIUM 75 MG PO TBEC: 75.0000 mg | DELAYED_RELEASE_TABLET | Freq: Two times a day (BID) | ORAL | 2 refills | Status: DC

## 2022-01-12 NOTE — Progress Notes (Addendum)
Subjective:   Patient ID: Tina Williams, female   DOB: 61 y.o.   MRN: [id number]   HPI Patient presents stating that overall she is doing well but she gets some pain in the big toe joint that she wanted checked and at times it can be bothersome but overall she is doing well in the plantar foot is doing great.  Patient states that she still having trouble getting in her shoe and is not ready for work return yet due to the discomfort and swelling   ROS      Objective:  Physical Exam  Neurovascular status intact negative Bevelyn Buckles' sign noted 6 weeks after having foot surgery left with moderate swelling around the first MPJ but patient has been very active and trying to be on the foot is much as possible     Assessment:  Overall doing well post surgery left with normal amount of swelling for 6 weeks postop.  Still having discomfort and inability to wear shoe gear as I would hope     Plan:  H&P x-rays reviewed and recommended the continuation of activity levels range of motion and I did prescribe anti-inflammatory to continue to work on condition.  Patient will be seen back to recheck.  I do think over hopefully the next month she will be able to return to normal shoe gear but at this point the swelling is not allowing her to do it to the degree that we would consider ready to return to normal activity  X-rays indicate osteotomies healing well joint congruence fixation in place

## 2022-01-17 ENCOUNTER — Telehealth: Payer: Self-pay

## 2022-01-17 NOTE — Telephone Encounter (Signed)
oxyCODONE-acetaminophen (PERCOCET) 10-325 MG tablet  Prior Authorization was started on 01/17/22

## 2022-01-20 NOTE — Telephone Encounter (Signed)
This looks old. I'm assuming she doesn't need pain medicine at this time

## 2022-01-27 ENCOUNTER — Telehealth: Payer: Self-pay | Admitting: Podiatry

## 2022-01-27 NOTE — Telephone Encounter (Signed)
Good Morning, Tina Williams called and said her disability was denied because your notes are not detailed enough as to why she is still out of work. On your notes you said shes doing well, so they want to know, if shes doing well, why is she not able to work. If you could please amend her notes and I will send back over to her disability and hopefully get it approved.

## 2022-01-28 NOTE — Telephone Encounter (Signed)
I ammended the note

## 2022-02-02 ENCOUNTER — Other Ambulatory Visit: Payer: Self-pay

## 2022-02-03 ENCOUNTER — Ambulatory Visit: Payer: BC Managed Care – PPO | Admitting: Cardiology

## 2022-02-03 ENCOUNTER — Encounter: Payer: Self-pay | Admitting: Cardiology

## 2022-02-03 VITALS — BP 114/68 | HR 70 | Ht 61.0 in | Wt 153.1 lb

## 2022-02-03 DIAGNOSIS — Z79899 Other long term (current) drug therapy: Secondary | ICD-10-CM

## 2022-02-03 DIAGNOSIS — I491 Atrial premature depolarization: Secondary | ICD-10-CM | POA: Diagnosis not present

## 2022-02-03 NOTE — Progress Notes (Signed)
Cardiology Office Note:    Date:  02/03/2022   ID:  Tina Williams, DOB 05-Jul-1960, MRN [id number]  PCP:  Midge Minium, MD  Cardiologist:  Shirlee More, MD    Referring MD: Midge Minium, MD    ASSESSMENT:    1. APC (atrial premature contractions)   2. High risk medication use    PLAN:    In order of problems listed above:  She continues to do well she has had nice suppression resolution of symptomatic APCs tolerates very low-dose flecainide without side effect or toxicity and will continue the same. I reinforced with her the need to avoid over-the-counter proarrhythmic drugs Continue her dietary alterations that have markedly improved the quality of her life   Next appointment: 9 months   Medication Adjustments/Labs and Tests Ordered: Current medicines are reviewed at length with the patient today.  Concerns regarding medicines are outlined above.  Orders Placed This Encounter  Procedures   EKG 12-Lead   No orders of the defined types were placed in this encounter.   Chief Complaint  Patient presents with   Follow-up    Symptomatic APCs on flecainide    History of Present Illness:    Tina Williams is a 61 y.o. female with a hx of atrial arrhythmia with frequent and symptomatic APCs suppressed with flecainide normal coronary arteriography ejection fraction 16% and a cardiac CTA in August 2021 with a coronary calcium score of 0 and normal coronary arteries last seen 03/22/2021.  Compliance with diet, lifestyle and medications: yes  She is pleased with the quality of her life she has been seeing a naturopath has altered her diet to improve the quality of her life. She tolerates flecainide without side effects and has had no palpitation or side effect. She has had no cardiovascular symptoms of edema shortness of breath orthopnea chest pain palpitation or syncope Past Medical History:  Diagnosis Date   Allergic rhinoconjunctivitis 09/27/2017   Allergy     ANA positive 11/28/2017   ASTHMA 10/09/2008   HFA 75% p coaching  09/27/10     Early menopause    FOOT PAIN, BILATERAL 10/28/2009   Qualifier: Diagnosis of  By: Birdie Riddle MD, Lurena Nida medical examination 03/23/2011   H/O oralPollinosis 03/04/2015   Heart murmur    Irregular heart beat per pt and on medication   Hx of adenomatous polyp of colon 01/11/2016   Hyperlipidemia    Mild persistent asthma without complication 27/25/3664   Moderate recurrent major depression ([place]) 01/06/2014   MYALGIA 11/24/2009   Qualifier: Diagnosis of  By: Birdie Riddle MD, Katherine     Non-allergic rhinitis    Osteoarthritis    Osteoporosis 07/2018   T score -2.9   Pain in left ankle and joints of left foot 05/07/2018   Personal history of colonic adenoma  11/26/2009   Polyarthralgia 10/25/2012   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   alendronate (FOSAMAX) 70 MG tablet TAKE 1 TABLET BY MOUTH EVERY 7 DAYS WITH FULL GLASS OF WATER ON EMPTY STOMACH   B Complex Vitamins (VITAMIN B COMPLEX PO) Take 1 tablet by mouth daily.   CALCIUM PO Take 1 tablet by mouth daily.   Cholecalciferol (VITAMIN D3 PO) Take 1 tablet by mouth daily.   DULoxetine (CYMBALTA) 30 MG capsule TAKE 1 CAPSULE BY MOUTH EVERY DAY   flecainide (TAMBOCOR) 50 MG tablet Take 1 tablet (50 mg total) by mouth 2 (two) times daily.   montelukast (SINGULAIR) 10 MG tablet TAKE 1 TABLET BY MOUTH EVERY DAY   Multiple  Vitamin (MULTI-VITAMIN PO) Take 1 tablet by mouth daily.   NON FORMULARY Special salt with water.   Probiotic Product (PROBIOTIC PO) Take 1 tablet by mouth daily.     Allergies:   Latex, Amoxicillin, Clarithromycin, Naproxen sodium, Penicillins, and Sulfa antibiotics   Social History   Socioeconomic History   Marital status: Married    Spouse name: Not on file   Number of children: 3   Years of education: Not on file   Highest education level: Not on file  Occupational History   Occupation: Teacher's Aid  Tobacco Use   Smoking status: Former    Packs/day: 0.50    Years: 18.00    Total pack years: 9.00    Types: Cigarettes    Quit date: 06/28/1999    Years since quitting: 22.6   Smokeless tobacco: Never  Vaping Use   Vaping Use: Never used  Substance and Sexual Activity   Alcohol use: Yes    Alcohol/week: 7.0 standard drinks of alcohol    Types: 7 Glasses of wine per week    Comment: weekly   Drug use: Never   Sexual activity: Yes    Birth control/protection: Post-menopausal    Comment: 1st intercourse 61 yo-Fewer than 5 partners  Other Topics Concern   Not on file  Social History Narrative   Not on file   Social Determinants of Health   Financial Resource Strain: Not on file  Food Insecurity: Not on file  Transportation Needs: Not on file  Physical Activity: Not on file  Stress: Not on file  Social Connections: Not on file     Family History: The patient's family history includes Colon cancer (age of onset: 58) in her sister; Colon polyps in her brother, brother, sister, and sister; Diabetes in her brother, mother, and sister; Emphysema in her father; Epilepsy in her sister; Healthy in her son, son, and son; Heart Problems in her son; Hypertension in her brother and mother; Lung cancer in her sister. There is no history of Esophageal cancer, Rectal cancer, or Stomach cancer. ROS:   Please see the history of present illness.    All other systems reviewed and are  negative.  EKGs/Labs/Other Studies Reviewed:    The following studies were reviewed today:  EKG:  EKG ordered today and personally reviewed.  The ekg ordered today demonstrates sinus rhythm normal no findings of Group 1 C antiarrhythmic drug toxicity   Recent Labs: 10/20/2021: ALT 12; BUN 17; Creatinine, Ser 0.59; Hemoglobin 13.1; Platelets 415.0; Potassium 4.8; Sodium 142; TSH 2.17  Recent Lipid Panel    Component Value Date/Time   CHOL 210 (H) 10/20/2021 1026   TRIG 55.0 10/20/2021 1026   HDL 74.00 10/20/2021 1026   CHOLHDL 3 10/20/2021 1026   VLDL 11.0 10/20/2021 1026   LDLCALC 125 (H) 10/20/2021 1026  LDLDIRECT 161.6 03/23/2011 1331    Physical Exam:    VS:  BP 114/68   Pulse 70   Ht '5\' 1"'$  (1.549 m)   Wt 153 lb 1.3 oz (69.4 kg)   SpO2 98%   BMI 28.92 kg/m     Wt Readings from Last 3 Encounters:  02/03/22 153 lb 1.3 oz (69.4 kg)  10/20/21 146 lb 6.4 oz (66.4 kg)  09/29/21 148 lb 12.8 oz (67.5 kg)     GEN:  Well nourished, well developed in no acute distress HEENT: Normal NECK: No JVD; No carotid bruits LYMPHATICS: No lymphadenopathy CARDIAC: RRR, no murmurs, rubs, gallops RESPIRATORY:  Clear to auscultation without rales, wheezing or rhonchi  ABDOMEN: Soft, non-tender, non-distended MUSCULOSKELETAL:  No edema; No deformity  SKIN: Warm and dry NEUROLOGIC:  Alert and oriented x 3 PSYCHIATRIC:  Normal affect    Signed, Shirlee More, MD  02/03/2022 12:10 PM    [facility]

## 2022-02-03 NOTE — Patient Instructions (Signed)

## 2022-02-23 ENCOUNTER — Ambulatory Visit (INDEPENDENT_AMBULATORY_CARE_PROVIDER_SITE_OTHER): Payer: BC Managed Care – PPO

## 2022-02-23 ENCOUNTER — Encounter: Payer: Self-pay | Admitting: Podiatry

## 2022-02-23 ENCOUNTER — Ambulatory Visit (INDEPENDENT_AMBULATORY_CARE_PROVIDER_SITE_OTHER): Payer: BC Managed Care – PPO | Admitting: Podiatry

## 2022-02-23 DIAGNOSIS — Z9889 Other specified postprocedural states: Secondary | ICD-10-CM

## 2022-02-23 NOTE — Progress Notes (Signed)
Subjective:   Patient ID: Tina Williams, female   DOB: 61 y.o.   MRN: [id number]   HPI Patient states doing well with the left foot very pleased with surgery and still having mild swelling but continuing to improve   ROS      Objective:  Physical Exam  Neurovascular status intact negative Bevelyn Buckles' sign noted wound edges left healed well range of motion good no crepitus of the joint mild swelling around the first MPJ good healing plantar fibroma site     Assessment:  Well post osteotomy post plantar fibroma excision     Plan:  Advised on continuation of range of motion exercises gradual increase in shoe gear usage and patient will be seen back for [place] to recheck  X-rays indicate osteotomies healing well fixation in place joint congruence

## 2022-04-14 ENCOUNTER — Ambulatory Visit (INDEPENDENT_AMBULATORY_CARE_PROVIDER_SITE_OTHER): Payer: BC Managed Care – PPO | Admitting: Family Medicine

## 2022-04-14 ENCOUNTER — Encounter: Payer: Self-pay | Admitting: Family Medicine

## 2022-04-14 VITALS — BP 118/80 | HR 73 | Temp 97.2°F | Resp 16 | Ht 61.0 in | Wt 158.1 lb

## 2022-04-14 DIAGNOSIS — H9313 Tinnitus, bilateral: Secondary | ICD-10-CM

## 2022-04-14 DIAGNOSIS — H9193 Unspecified hearing loss, bilateral: Secondary | ICD-10-CM | POA: Diagnosis not present

## 2022-04-14 NOTE — Progress Notes (Signed)
   Subjective:    Patient ID: Tina Williams, female    DOB: 22-Apr-1961, 61 y.o.   MRN: [id number]  HPI Hearing loss- pt had a yearly hearing test at work and failed.  There was a big change from last year to this year.  She then went to employee health and failed that test.  They recommended she follow up here.  She reports hearing loss is greater in R than L.  Pt reports ringing in her ears.  Saw ENT years ago.  Taking Zyrtec and singulair daily   Review of Systems For ROS see HPI     Objective:   Physical Exam Vitals reviewed.  Constitutional:      General: She is not in acute distress.    Appearance: Normal appearance. She is not ill-appearing.  HENT:     Head: Normocephalic and atraumatic.     Right Ear: Tympanic membrane, ear canal and external ear normal.     Left Ear: Tympanic membrane, ear canal and external ear normal.  Skin:    General: Skin is warm and dry.  Neurological:     General: No focal deficit present.     Mental Status: She is alert and oriented to person, place, and time.  Psychiatric:        Mood and Affect: Mood normal.        Behavior: Behavior normal.        Thought Content: Thought content normal.           Assessment & Plan:  Hearing loss/tinnitus- new.  Bilateral.  Pt reports hearing loss is worse in R but present bilaterally.  According to her employer, there has been a marked change in her yearly hearing screens from last year to this year.  Will refer to ENT first given that she is having tinnitus.  If all checks out fine, will then proceed to Audiology.  Pt expressed understanding and is in agreement w/ plan.

## 2022-04-14 NOTE — Patient Instructions (Signed)
Follow up as needed or as scheduled We'll call you to schedule your ENT appt to better assess hearing loss and ringing in the ears Call with any questions or concerns Stay Safe!  Stay Healthy! Happy Fall!

## 2022-04-20 ENCOUNTER — Ambulatory Visit (INDEPENDENT_AMBULATORY_CARE_PROVIDER_SITE_OTHER): Payer: BC Managed Care – PPO

## 2022-04-20 ENCOUNTER — Ambulatory Visit (INDEPENDENT_AMBULATORY_CARE_PROVIDER_SITE_OTHER): Payer: BC Managed Care – PPO | Admitting: Podiatry

## 2022-04-20 ENCOUNTER — Encounter: Payer: Self-pay | Admitting: Podiatry

## 2022-04-20 DIAGNOSIS — Z9889 Other specified postprocedural states: Secondary | ICD-10-CM

## 2022-04-20 DIAGNOSIS — M7752 Other enthesopathy of left foot: Secondary | ICD-10-CM

## 2022-04-20 MED ORDER — MELOXICAM 15 MG PO TABS: 15.0000 mg | ORAL_TABLET | Freq: Every day | ORAL | 2 refills | Status: DC

## 2022-04-20 NOTE — Progress Notes (Signed)
Subjective:   Patient ID: Tina Williams, female   DOB: 61 y.o.   MRN: [id number]   HPI Patient states she is still getting some swelling to the big toe joint just wanted to check it states it gets sore at times   ROS      Objective:  Physical Exam  Neurovascular status intact negative Bevelyn Buckles' sign noted no restriction of motion first MPJ no crepitus mild soreness     Assessment:  Inflammatory capsulitis around the first MPJ with patient having had surgery which is still most likely not completely healed     Plan:  H&P reviewed x-rays and the fact that complete healing probably has not occurred but she does have inflammation as precautionary measure I did place her on Mobic and she does have trouble with some anti-inflammatories so she does not tolerate it well she will not take it.  Patient will be seen back if symptoms indicate I do think this should get completely better in the next few months  X-rays indicate that the joint looks good congruence fixation in place no indication secondary motion

## 2022-05-20 DIAGNOSIS — R0981 Nasal congestion: Secondary | ICD-10-CM | POA: Diagnosis not present

## 2022-05-20 DIAGNOSIS — R059 Cough, unspecified: Secondary | ICD-10-CM | POA: Diagnosis not present

## 2022-05-20 DIAGNOSIS — J069 Acute upper respiratory infection, unspecified: Secondary | ICD-10-CM | POA: Diagnosis not present

## 2022-05-20 DIAGNOSIS — J45909 Unspecified asthma, uncomplicated: Secondary | ICD-10-CM | POA: Diagnosis not present

## 2022-05-30 DIAGNOSIS — H903 Sensorineural hearing loss, bilateral: Secondary | ICD-10-CM | POA: Diagnosis not present

## 2022-06-02 DIAGNOSIS — H903 Sensorineural hearing loss, bilateral: Secondary | ICD-10-CM

## 2022-06-02 DIAGNOSIS — J310 Chronic rhinitis: Secondary | ICD-10-CM | POA: Diagnosis not present

## 2022-06-02 DIAGNOSIS — H9313 Tinnitus, bilateral: Secondary | ICD-10-CM | POA: Diagnosis not present

## 2022-06-02 HISTORY — DX: Sensorineural hearing loss, bilateral: H90.3

## 2022-06-05 ENCOUNTER — Other Ambulatory Visit: Payer: Self-pay | Admitting: Cardiology

## 2022-06-06 NOTE — Telephone Encounter (Signed)
Rx refill sent to pharmacy. 

## 2022-07-02 DIAGNOSIS — J019 Acute sinusitis, unspecified: Secondary | ICD-10-CM | POA: Diagnosis not present

## 2022-07-28 DIAGNOSIS — H903 Sensorineural hearing loss, bilateral: Secondary | ICD-10-CM | POA: Diagnosis not present

## 2022-07-28 DIAGNOSIS — J320 Chronic maxillary sinusitis: Secondary | ICD-10-CM | POA: Diagnosis not present

## 2022-07-28 DIAGNOSIS — H9313 Tinnitus, bilateral: Secondary | ICD-10-CM | POA: Diagnosis not present

## 2022-08-14 ENCOUNTER — Other Ambulatory Visit: Payer: Self-pay | Admitting: Allergy and Immunology

## 2022-08-15 DIAGNOSIS — J3489 Other specified disorders of nose and nasal sinuses: Secondary | ICD-10-CM | POA: Diagnosis not present

## 2022-08-15 DIAGNOSIS — J320 Chronic maxillary sinusitis: Secondary | ICD-10-CM | POA: Diagnosis not present

## 2022-09-26 ENCOUNTER — Other Ambulatory Visit: Payer: Self-pay | Admitting: Family Medicine

## 2022-09-29 ENCOUNTER — Encounter: Payer: Self-pay | Admitting: Allergy and Immunology

## 2022-09-29 ENCOUNTER — Ambulatory Visit: Payer: BC Managed Care – PPO | Admitting: Allergy and Immunology

## 2022-09-29 VITALS — BP 124/88 | HR 74 | Temp 98.0°F | Resp 18

## 2022-09-29 DIAGNOSIS — J4530 Mild persistent asthma, uncomplicated: Secondary | ICD-10-CM

## 2022-09-29 DIAGNOSIS — J3089 Other allergic rhinitis: Secondary | ICD-10-CM

## 2022-09-29 DIAGNOSIS — T781XXD Other adverse food reactions, not elsewhere classified, subsequent encounter: Secondary | ICD-10-CM

## 2022-09-29 MED ORDER — AIRSUPRA 90-80 MCG/ACT IN AERO: 2.0000 | INHALATION_SPRAY | RESPIRATORY_TRACT | 1 refills | Status: DC | PRN

## 2022-09-29 MED ORDER — AUVI-Q 0.3 MG/0.3ML IJ SOAJ: INTRAMUSCULAR | 3 refills | Status: AC

## 2022-09-29 MED ORDER — MONTELUKAST SODIUM 10 MG PO TABS: 10.0000 mg | ORAL_TABLET | Freq: Every day | ORAL | 3 refills | Status: DC

## 2022-09-29 NOTE — Progress Notes (Signed)
[place]   Follow-up Note  Referring Provider: Sheliah Hatch, MD Primary Provider: Sheliah Hatch, MD Date of Office Visit: 09/29/2022  Subjective:   Tina Williams (DOB: 1961-06-11) is a 62 y.o. female who [facility] in re-evaluation of the following:  HPI: Tina Williams returns to this clinic in evaluation of asthma, allergic rhinitis, oral allergy syndrome.  I last saw her in this clinic 29 September 2021.  She contacted a viral respiratory tract infection in November 2023 that gave rise to prolonged "sinusitis" requiring 2 courses of doxycycline, prednisone, and subsequently clindamycin administered by her ENT doctor for which she developed hives with the use of clindamycin.  She believes that her asthma has been under very good control except for that viral infection and she rarely uses a short acting bronchodilator although occasionally during the early spring she must use this agent a few times a week.  She can exert herself without any problem.  Her nose has been doing pretty well while using montelukast and Zyrtec.  She is very careful about consuming any type of fresh fruits and vegetables especially during the pollination seasons.  Allergies as of 09/29/2022       Reactions   Latex Other (See Comments)   Throat swelling, chest tightness   Amoxicillin Hives   Clarithromycin    REACTION: hives   Naproxen Sodium    REACTION: HIVES   Penicillins    REACTION: hives   Sulfa Antibiotics Hives        Medication List    albuterol 108 (90 Base) MCG/ACT inhaler Commonly known as: ProAir HFA Inhale 2 puffs into the lungs every 4 (four) hours as needed for wheezing or shortness of breath.   alendronate 70 MG tablet Commonly known as: FOSAMAX TAKE 1 TABLET BY MOUTH EVERY 7 DAYS WITH FULL GLASS OF WATER ON EMPTY STOMACH   CALCIUM PO Take 1 tablet by mouth daily.   DULoxetine 30  MG capsule Commonly known as: CYMBALTA TAKE 1 CAPSULE BY MOUTH EVERY DAY   flecainide 50 MG tablet Commonly known as: TAMBOCOR TAKE 1 TABLET BY MOUTH TWICE A DAY   meloxicam 15 MG tablet Commonly known as: MOBIC Take 1 tablet (15 mg total) by mouth daily.   montelukast 10 MG tablet Commonly known as: SINGULAIR TAKE 1 TABLET BY MOUTH EVERY DAY   MULTI-VITAMIN PO Take 1 tablet by mouth daily.   NON FORMULARY Special salt with water.   PROBIOTIC PO Take 1 tablet by mouth daily.   VITAMIN B COMPLEX PO Take 1 tablet by mouth daily.   VITAMIN D3 PO Take 1 tablet by mouth daily.     Past Medical History:  Diagnosis Date   Allergic rhinoconjunctivitis 09/27/2017   Allergy    ANA positive 11/28/2017   ASTHMA 10/09/2008   HFA 75% p coaching  09/27/10     Early menopause    FOOT PAIN, BILATERAL 10/28/2009   Qualifier: Diagnosis of  By: Beverely Low MD, [place] Dura medical examination 03/23/2011   H/O oralPollinosis 03/04/2015   Heart murmur    Irregular heart beat per pt and on medication   Hx of adenomatous polyp of colon 01/11/2016   Hyperlipidemia    Mild persistent asthma without complication 09/27/2017   Moderate recurrent major depression 01/06/2014   MYALGIA 11/24/2009   Qualifier: Diagnosis of  By: Beverely Low MD, Natalia Leatherwood  Non-allergic rhinitis    Osteoarthritis    Osteoporosis 07/2018   T score -2.9   Pain in left ankle and joints of left foot 05/07/2018   Personal history of colonic adenoma  11/26/2009   Plantar fibromatosis    left foot   Polyarthralgia 10/25/2012   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   HENT: Negative.    Eyes: Negative.   Respiratory: Negative.    Cardiovascular: Negative.   Gastrointestinal: Negative.   Genitourinary: Negative.   Musculoskeletal: Negative.   Skin: Negative.   Neurological: Negative.   Endo/Heme/Allergies: Negative.   Psychiatric/Behavioral: Negative.       Objective:   Vitals:   09/29/22 1148  BP: 124/88  Pulse: 74  Resp: 18  Temp: 98 F (36.7 C)  SpO2: 99%          Physical Exam Constitutional:      Appearance: She is not diaphoretic.  HENT:     Head: Normocephalic.     Right Ear: Tympanic membrane, ear canal and external ear normal.     Left Ear: Tympanic membrane, ear canal and external ear normal.     Nose: Nose normal. No mucosal edema or rhinorrhea.     Mouth/Throat:     Pharynx: Uvula midline. No oropharyngeal exudate.  Eyes:     Conjunctiva/sclera: Conjunctivae normal.  Neck:     Thyroid: No thyromegaly.     Trachea: Trachea normal. No tracheal tenderness or tracheal deviation.  Cardiovascular:     Rate and Rhythm: Normal rate and regular rhythm.     Heart sounds: Normal heart sounds, S1 normal and S2 normal. No murmur heard. Pulmonary:     Effort: No respiratory distress.     Breath sounds: Normal breath sounds. No stridor. No wheezing or rales.  Lymphadenopathy:     Head:     Right side of head: No tonsillar adenopathy.     Left side of head: No tonsillar adenopathy.     Cervical: No cervical adenopathy.  Skin:    Findings: No erythema or rash.     Nails: There is no clubbing.  Neurological:     Mental Status: She is alert.     Diagnostics:    Spirometry was performed and demonstrated an FEV1 of 1.98 at 91 % of predicted.   Assessment and Plan:   1. Asthma, well controlled, mild persistent   2. Perennial allergic  rhinitis   3. Oral allergy syndrome, subsequent encounter      1. Continue Montelukast 10mg  one tablet one time per day  2. Continue zyrtec 10 mg - 1 tablet 1 time per day  3. Continue Flonase - 1-2 sprays each nostril 1 time per day  4. Continue Epi-Pen if needed  5. START AirSupra - 2 inhalations every 4-6 hours if needed (Coupon)  6. Return in 12 months or earlier if problem  Other than  1 viral infection that gave rise to a respiratory tract flare Tina Williams has done very well on her current plan of using a leukotriene modifier nasal steroid to address her atopic respiratory disease.  She will continue on this plan and she has several agents to be utilized should they be required as noted above.  I will see her back in this clinic in 1 year or earlier if there is a problem.  Laurette SchimkeEric Avayah Raffety, MD Allergy / Immunology  [facility]

## 2022-09-29 NOTE — Patient Instructions (Addendum)
  1. Continue Montelukast 10mg  one tablet one time per day  2. Continue zyrtec 10 mg - 1 tablet 1 time per day  3. Continue Flonase - 1-2 sprays each nostril 1 time per day  4. Continue Epi-Pen if needed  5. START AirSupra - 2 inhalations every 4-6 hours if needed (Coupon)  6. Return in 12 months or earlier if problem

## 2022-10-03 ENCOUNTER — Encounter: Payer: Self-pay | Admitting: Allergy and Immunology

## 2022-10-10 ENCOUNTER — Encounter: Payer: Self-pay | Admitting: Allergy and Immunology

## 2022-10-10 ENCOUNTER — Ambulatory Visit (INDEPENDENT_AMBULATORY_CARE_PROVIDER_SITE_OTHER): Payer: BC Managed Care – PPO | Admitting: Allergy and Immunology

## 2022-10-10 VITALS — BP 124/72 | HR 82 | Resp 16

## 2022-10-10 DIAGNOSIS — T781XXD Other adverse food reactions, not elsewhere classified, subsequent encounter: Secondary | ICD-10-CM

## 2022-10-10 DIAGNOSIS — J3089 Other allergic rhinitis: Secondary | ICD-10-CM | POA: Diagnosis not present

## 2022-10-10 DIAGNOSIS — J4531 Mild persistent asthma with (acute) exacerbation: Secondary | ICD-10-CM

## 2022-10-10 MED ORDER — METHYLPREDNISOLONE ACETATE 80 MG/ML IJ SUSP
80.0000 mg | Freq: Once | INTRAMUSCULAR | Status: AC
  Administered 2022-10-10: 80 mg via INTRAMUSCULAR

## 2022-10-10 NOTE — Progress Notes (Unsigned)
[place] - [place] - [place] - [place]   Follow-up Note  Referring Provider: Sheliah Hatch, MD Primary Provider: Sheliah Hatch, MD Date of Office Visit: 10/10/2022  Subjective:   Tina Williams (DOB: 26-Aug-1960) is a 62 y.o. female who [facility]22 in re-evaluation of the following:  HPI: Tina Williams returns to this clinic in evaluation of asthma, allergic rhinitis, oral allergy syndrome.  Her last visit to this clinic was 29 September 2022 at which point in time she was doing relatively well.  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  She been coughing a lot and having some sternal chest pain and some upper back pain.  She has not had any fever or ugly nasal discharge or ugly sputum production or associated systemic or constitutional symptoms.    Allergies as of 10/10/2022       Reactions   Latex Other (See Comments)   Throat swelling, chest tightness   Amoxicillin Hives   Clarithromycin    REACTION: hives   Naproxen Sodium    REACTION: HIVES   Penicillins    REACTION: hives   Sulfa Antibiotics Hives        Medication List    Airsupra 90-80 MCG/ACT Aero Generic drug: Albuterol-Budesonide Inhale 2 puffs into the lungs as needed (every four to six hours for cough, wheeze, shortness of breath.  Rinse, gargle, and spit after use).   albuterol 108 (90 Base) MCG/ACT inhaler Commonly known as: ProAir HFA Inhale 2 puffs into the lungs every 4 (four) hours as needed for wheezing or shortness of breath.   alendronate 70 MG tablet Commonly known as: FOSAMAX TAKE 1 TABLET BY MOUTH EVERY 7 DAYS WITH FULL GLASS OF WATER ON EMPTY STOMACH   Auvi-Q 0.3 mg/0.3 mL Soaj injection Generic drug: EPINEPHrine Use as directed for life-threatening allergic reaction.    CALCIUM PO Take 1 tablet by mouth daily.   DULoxetine 30 MG capsule Commonly known as: CYMBALTA TAKE 1 CAPSULE BY MOUTH EVERY DAY   flecainide 50 MG tablet Commonly known as: TAMBOCOR TAKE 1 TABLET BY MOUTH TWICE A DAY   fluticasone 50 MCG/ACT nasal spray Commonly known as: FLONASE Instill 2 puffs each nostril every night.   montelukast 10 MG tablet Commonly known as: SINGULAIR Take 1 tablet (10 mg total) by mouth daily.   NON FORMULARY Special salt with water.   PROBIOTIC PO Take 1 tablet by mouth daily.   VITAMIN B COMPLEX PO Take 1 tablet by mouth daily.   VITAMIN D3 PO Take 1 tablet by mouth daily.    Past Medical History:  Diagnosis Date   Allergic rhinoconjunctivitis 09/27/2017   Allergy    ANA positive 11/28/2017   ASTHMA 10/09/2008   HFA 75% p coaching  09/27/10     Early menopause    FOOT PAIN, BILATERAL 10/28/2009   Qualifier: Diagnosis of  By: Beverely Low MD, [place] Dura medical examination 03/23/2011   H/O oralPollinosis 03/04/2015   Heart murmur    Irregular heart beat per pt and on medication   Hx of adenomatous polyp of colon 01/11/2016   Hyperlipidemia    Mild persistent asthma without complication 09/27/2017   Moderate recurrent major depression 01/06/2014   MYALGIA 11/24/2009   Qualifier: Diagnosis of  By: Beverely Low MD,  Katherine     Non-allergic rhinitis    Osteoarthritis    Osteoporosis 07/2018   T score -2.9   Pain in left ankle and joints of left foot 05/07/2018   Personal history of colonic adenoma  11/26/2009   Plantar fibromatosis    left foot   Polyarthralgia 10/25/2012   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   HENT: Negative.    Eyes: Negative.   Respiratory: Negative.    Cardiovascular: Negative.   Gastrointestinal: Negative.   Genitourinary: Negative.   Musculoskeletal: Negative.   Skin: Negative.   Neurological: Negative.   Endo/Heme/Allergies: Negative.   Psychiatric/Behavioral: Negative.       Objective:   Vitals:   10/10/22 1709  BP: 124/72  Pulse: 82  Resp: 16  SpO2: 97%          Physical Exam Constitutional:      Appearance: She is not diaphoretic.  HENT:     Head: Normocephalic.     Right Ear: Tympanic membrane, ear canal and external ear normal.     Left Ear: Tympanic membrane, ear canal and external ear normal.     Nose: Nose normal. No mucosal edema or rhinorrhea.     Mouth/Throat:     Pharynx: Uvula midline. No oropharyngeal exudate.  Eyes:     Conjunctiva/sclera: Conjunctivae normal.  Neck:     Thyroid: No thyromegaly.     Trachea: Trachea normal. No tracheal tenderness or tracheal deviation.  Cardiovascular:     Rate and Rhythm: Normal rate and regular rhythm.     Heart sounds: Normal heart sounds, S1 normal and S2 normal. No murmur heard. Pulmonary:     Effort: No respiratory distress.     Breath sounds: Normal breath sounds. No stridor. No wheezing or rales.  Lymphadenopathy:     Head:     Right side of head: No tonsillar adenopathy.     Left side of head: No tonsillar adenopathy.     Cervical: No cervical adenopathy.  Skin:    Findings: No erythema or rash.     Nails: There is no clubbing.  Neurological:     Mental Status: She is alert.     Diagnostics: Spirometry was performed.  Her FEV1 was 2.08 which was 94% of predicted.  Assessment and Plan:   1. Asthma, not well controlled, mild persistent, with acute exacerbation    2. Perennial allergic rhinitis   3. Oral allergy syndrome, subsequent encounter    1. Continue Montelukast 10mg  one tablet one time per day  2. Continue zyrtec 10 mg - 1 tablet 1 time per day  3. Continue Flonase - 1-2 sprays each nostril 1 time per day  4. Continue Epi-Pen if needed  5. Continue AirSupra - 2 inhalations every 4-6 hours if needed (Coupon)  6. For this recent event: Depomedrol 80 mg IM delivered in clinic today  7. Return  in 12 months or earlier if problem  Tina Williams developed an exposure at work that gave rise to inflammation of her airway and it should be easily handled with the therapy noted above which includes the use of a systemic steroid while she maintains treatment with anti-inflammatory agents for her airway.  Assuming she does well I will see her back in this clinic in 1 year or earlier if there is a problem.   Laurette Schimke, MD Allergy / Immunology [facility]

## 2022-10-10 NOTE — Patient Instructions (Addendum)
  1. Continue Montelukast 10mg  one tablet one time per day  2. Continue zyrtec 10 mg - 1 tablet 1 time per day  3. Continue Flonase - 1-2 sprays each nostril 1 time per day  4. Continue Epi-Pen if needed  5. Continue AirSupra - 2 inhalations every 4-6 hours if needed (Coupon)  6. For this recent event: Depomedrol 80 mg IM delivered in clinic today  7. Return in 12 months or earlier if problem

## 2022-10-11 ENCOUNTER — Encounter: Payer: Self-pay | Admitting: Allergy and Immunology

## 2022-10-14 ENCOUNTER — Other Ambulatory Visit: Payer: Self-pay | Admitting: Family Medicine

## 2022-12-03 ENCOUNTER — Other Ambulatory Visit: Payer: Self-pay | Admitting: Cardiology

## 2022-12-22 ENCOUNTER — Encounter: Payer: Self-pay | Admitting: Family Medicine

## 2022-12-30 ENCOUNTER — Other Ambulatory Visit: Payer: Self-pay | Admitting: Cardiology

## 2023-01-22 ENCOUNTER — Other Ambulatory Visit: Payer: Self-pay | Admitting: Cardiology

## 2023-01-30 ENCOUNTER — Ambulatory Visit (INDEPENDENT_AMBULATORY_CARE_PROVIDER_SITE_OTHER): Payer: 59 | Admitting: Family Medicine

## 2023-01-30 ENCOUNTER — Encounter: Payer: Self-pay | Admitting: Family Medicine

## 2023-01-30 VITALS — BP 118/68 | HR 75 | Temp 97.9°F | Resp 17 | Ht 61.0 in | Wt 163.1 lb

## 2023-01-30 DIAGNOSIS — E669 Obesity, unspecified: Secondary | ICD-10-CM | POA: Diagnosis not present

## 2023-01-30 DIAGNOSIS — Z1231 Encounter for screening mammogram for malignant neoplasm of breast: Secondary | ICD-10-CM | POA: Diagnosis not present

## 2023-01-30 DIAGNOSIS — E559 Vitamin D deficiency, unspecified: Secondary | ICD-10-CM

## 2023-01-30 DIAGNOSIS — Z0000 Encounter for general adult medical examination without abnormal findings: Secondary | ICD-10-CM

## 2023-01-30 LAB — CBC WITH DIFFERENTIAL/PLATELET
Basophils Absolute: 0 10*3/uL (ref 0.0–0.1)
Basophils Relative: 0.7 % (ref 0.0–3.0)
Eosinophils Absolute: 0.2 10*3/uL (ref 0.0–0.7)
Eosinophils Relative: 4 % (ref 0.0–5.0)
HCT: 42 % (ref 36.0–46.0)
Hemoglobin: 13.6 g/dL (ref 12.0–15.0)
Lymphocytes Relative: 33.6 % (ref 12.0–46.0)
Lymphs Abs: 2 10*3/uL (ref 0.7–4.0)
MCHC: 32.4 g/dL (ref 30.0–36.0)
MCV: 88.3 fl (ref 78.0–100.0)
Monocytes Absolute: 0.5 10*3/uL (ref 0.1–1.0)
Monocytes Relative: 7.8 % (ref 3.0–12.0)
Neutro Abs: 3.3 10*3/uL (ref 1.4–7.7)
Neutrophils Relative %: 53.9 % (ref 43.0–77.0)
Platelets: 435 10*3/uL — ABNORMAL HIGH (ref 150.0–400.0)
RBC: 4.76 Mil/uL (ref 3.87–5.11)
RDW: 15.4 % (ref 11.5–15.5)
WBC: 6.1 10*3/uL (ref 4.0–10.5)

## 2023-01-30 LAB — LIPID PANEL
Cholesterol: 301 mg/dL — ABNORMAL HIGH (ref 0–200)
HDL: 79.6 mg/dL (ref >39.00)
LDL Cholesterol: 203 mg/dL — ABNORMAL HIGH (ref 0–99)
NonHDL: 221.68
Total CHOL/HDL Ratio: 4
Triglycerides: 95 mg/dL (ref 0.0–149.0)
VLDL: 19 mg/dL (ref 0.0–40.0)

## 2023-01-30 LAB — BASIC METABOLIC PANEL
BUN: 13 mg/dL (ref 6–23)
CO2: 28 mEq/L (ref 19–32)
Calcium: 9.2 mg/dL (ref 8.4–10.5)
Chloride: 103 mEq/L (ref 96–112)
Creatinine, Ser: 0.67 mg/dL (ref 0.40–1.20)
GFR: 93.85 mL/min (ref >60.00)
Glucose, Bld: 86 mg/dL (ref 70–99)
Potassium: 4.3 mEq/L (ref 3.5–5.1)
Sodium: 138 mEq/L (ref 135–145)

## 2023-01-30 LAB — VITAMIN D 25 HYDROXY (VIT D DEFICIENCY, FRACTURES): VITD: 62.56 ng/mL (ref 30.00–100.00)

## 2023-01-30 LAB — HEPATIC FUNCTION PANEL
ALT: 21 U/L (ref 0–35)
AST: 18 U/L (ref 0–37)
Albumin: 4.6 g/dL (ref 3.5–5.2)
Alkaline Phosphatase: 58 U/L (ref 39–117)
Bilirubin, Direct: 0.1 mg/dL (ref 0.0–0.3)
Total Bilirubin: 0.7 mg/dL (ref 0.2–1.2)
Total Protein: 7.3 g/dL (ref 6.0–8.3)

## 2023-01-30 LAB — TSH: TSH: 1.6 u[IU]/mL (ref 0.35–5.50)

## 2023-01-30 NOTE — Assessment & Plan Note (Signed)
Pt's PE WNL.  UTD on pap, colonoscopy, Tdap.  Due for mammo- ordered.  Check labs.  Encouraged low carb diet and regular exercise.

## 2023-01-30 NOTE — Progress Notes (Signed)
   Subjective:    Patient ID: Tina Williams, female    DOB: 07/12/1960, 62 y.o.   MRN: [id number]  HPI CPE- due for mammo.  UTD on pap, colonoscopy, Tdap  Patient Care Team    Relationship Specialty Notifications Start End  Sheliah Hatch, MD PCP - General Family Medicine  10/20/21   Iva Boop, MD Consulting Physician Gastroenterology  10/23/15   Jessica Priest, MD Consulting Physician Allergy and Immunology  10/28/16   Theresia Majors, MD (Inactive)  Obstetrics and Gynecology  10/20/21     Health Maintenance  Topic Date Due   MAMMOGRAM  11/02/2021   INFLUENZA VACCINE  01/26/2023   PAP SMEAR-Modifier  08/02/2023   DTaP/Tdap/Td (2 - Td or Tdap) 10/22/2025   Colonoscopy  09/01/2026   Hepatitis C Screening  Completed   HIV Screening  Completed   HPV VACCINES  Aged Out   COVID-19 Vaccine  Discontinued   Zoster Vaccines- Shingrix  Discontinued      Review of Systems Patient reports no vision/ hearing changes, adenopathy,fever,  persistant/recurrent hoarseness , swallowing issues, chest pain, palpitations, edema, persistant/recurrent cough, hemoptysis, dyspnea (rest/exertional/paroxysmal nocturnal), gastrointestinal bleeding (melena, rectal bleeding), abdominal pain, significant heartburn, bowel changes, GU symptoms (dysuria, hematuria, incontinence), Gyn symptoms (abnormal  bleeding, pain),  syncope, focal weakness, memory loss, numbness & tingling, skin/hair/nail changes, abnormal bruising or bleeding, anxiety, or depression.  + 5 lb weight gain     Objective:   Physical Exam General Appearance:    Alert, cooperative, no distress, appears stated age, obese  Head:    Normocephalic, without obvious abnormality, atraumatic  Eyes:    PERRL, conjunctiva/corneas clear, EOM's intact both eyes  Ears:    Normal TM's and external ear canals, both ears  Nose:   Nares normal, septum midline, mucosa normal, no drainage    or sinus tenderness  Throat:   Lips, mucosa, and tongue normal;  teeth and gums normal  Neck:   Supple, symmetrical, trachea midline, no adenopathy;    Thyroid: no enlargement/tenderness/nodules  Back:     Symmetric, no curvature, ROM normal, no CVA tenderness  Lungs:     Clear to auscultation bilaterally, respirations unlabored  Chest Wall:    No tenderness or deformity   Heart:    Regular rate and rhythm, S1 and S2 normal, no murmur, rub   or gallop  Breast Exam:    Deferred to mammo  Abdomen:     Soft, non-tender, bowel sounds active all four quadrants,    no masses, no organomegaly  Genitalia:    Deferred to GYN  Rectal:    Extremities:   Extremities normal, atraumatic, no cyanosis or edema  Pulses:   2+ and symmetric all extremities  Skin:   Skin color, texture, turgor normal, no rashes or lesions  Lymph nodes:   Cervical, supraclavicular, and axillary nodes normal  Neurologic:   CNII-XII intact, normal strength, sensation and reflexes    throughout          Assessment & Plan:

## 2023-01-30 NOTE — Assessment & Plan Note (Signed)
Pt has gained 5 lbs since last visit.  Encouraged healthy diet and regular exercise.  Check labs to risk stratify.  Will follow.

## 2023-01-30 NOTE — Patient Instructions (Signed)
Follow up in 1 year or as needed We'll notify you of your lab results and make any changes if needed Continue to work on healthy diet and regular exercise- you can do it! They should call you to schedule your mammogram Call with any questions or concerns Stay Safe!  Stay Healthy! CONGRATS on retirement!!!

## 2023-01-30 NOTE — Assessment & Plan Note (Signed)
Check labs and replete prn. 

## 2023-01-31 ENCOUNTER — Other Ambulatory Visit: Payer: Self-pay

## 2023-01-31 ENCOUNTER — Telehealth: Payer: Self-pay

## 2023-01-31 DIAGNOSIS — E785 Hyperlipidemia, unspecified: Secondary | ICD-10-CM

## 2023-01-31 MED ORDER — ROSUVASTATIN CALCIUM 20 MG PO TABS: 20.0000 mg | ORAL_TABLET | Freq: Every day | ORAL | 3 refills | Status: DC

## 2023-01-31 NOTE — Telephone Encounter (Signed)
-----   Message from Neena Rhymes sent at 01/31/2023  7:40 AM EDT ----- Your total cholesterol and LDL (bad cholesterol) have both jumped by nearly 100 points.  Based on this, we need to start Crestor 20mg  daily (#30, 3 refills) while working on healthy diet and regular exercise.  We will need to do a lab visit in 6 weeks to recheck liver functions and make sure the medication is being metabolized appropriately (LFTs, dx hyperlipidemia)  Remainder of labs look great!

## 2023-01-31 NOTE — Telephone Encounter (Signed)
Pt is aware of lab results . Crestor 20 mg has been sent in and liver function order for 6 weeks has been placed . Lab only visit has been made

## 2023-02-06 ENCOUNTER — Ambulatory Visit (HOSPITAL_BASED_OUTPATIENT_CLINIC_OR_DEPARTMENT_OTHER)
Admission: RE | Admit: 2023-02-06 | Discharge: 2023-02-06 | Disposition: A | Discharge status: Home / Self Care | Payer: 59 | Source: Ambulatory Visit | Attending: Family Medicine | Admitting: Family Medicine

## 2023-02-06 DIAGNOSIS — Z1231 Encounter for screening mammogram for malignant neoplasm of breast: Secondary | ICD-10-CM | POA: Insufficient documentation

## 2023-02-13 ENCOUNTER — Encounter: Payer: Self-pay | Admitting: Family Medicine

## 2023-02-21 ENCOUNTER — Ambulatory Visit: Payer: BC Managed Care – PPO | Admitting: Cardiology

## 2023-02-25 ENCOUNTER — Other Ambulatory Visit: Payer: Self-pay | Admitting: Cardiology

## 2023-03-13 ENCOUNTER — Other Ambulatory Visit (INDEPENDENT_AMBULATORY_CARE_PROVIDER_SITE_OTHER): Payer: 59

## 2023-03-13 DIAGNOSIS — E785 Hyperlipidemia, unspecified: Secondary | ICD-10-CM

## 2023-03-13 LAB — HEPATIC FUNCTION PANEL
ALT: 19 U/L (ref 0–35)
AST: 21 U/L (ref 0–37)
Albumin: 4.2 g/dL (ref 3.5–5.2)
Alkaline Phosphatase: 57 U/L (ref 39–117)
Bilirubin, Direct: 0.1 mg/dL (ref 0.0–0.3)
Total Bilirubin: 0.6 mg/dL (ref 0.2–1.2)
Total Protein: 7 g/dL (ref 6.0–8.3)

## 2023-03-14 ENCOUNTER — Telehealth: Payer: Self-pay

## 2023-03-14 NOTE — Telephone Encounter (Signed)
-----   Message from Neena Rhymes sent at 03/14/2023  7:20 AM EDT ----- Liver functions are normal.  No changes at this time

## 2023-03-14 NOTE — Telephone Encounter (Signed)
Pt has viewed via My Chart.

## 2023-04-17 ENCOUNTER — Other Ambulatory Visit: Payer: Self-pay

## 2023-04-17 DIAGNOSIS — M722 Plantar fascial fibromatosis: Secondary | ICD-10-CM | POA: Insufficient documentation

## 2023-04-18 NOTE — Progress Notes (Unsigned)
Cardiology Office Note:    Date:  04/19/2023   ID:  Tina Williams, DOB 1961/03/10, MRN [id number]  PCP:  Tina Hatch, MD  Cardiologist:  Tina Herrlich, MD    Referring MD: Tina Hatch, MD    ASSESSMENT:    1. APC (atrial premature contractions)   2. High risk medication use    PLAN:    In order of problems listed above:  She continues to do well with Williams-dose flecainide no recurrent tachyarrhythmias we will continue the same her EKG has no findings to suggest toxicity Asked her to use her smart watch on a regular basis to self monitor for recurrence of atrial arrhythmia Her LDL is severely elevated greater than 200 she has been intolerant of statins in the past tells me she has discussed this with her PCP and he does not want accept lipid-lowering therapy at this time   Next appointment: 1 year follow-up for atrial arrhythmia   Medication Adjustments/Labs and Tests Ordered: Current medicines are reviewed at length with the patient today.  Concerns regarding medicines are outlined above.  Orders Placed This Encounter  Procedures   EKG 12-Lead   No orders of the defined types were placed in this encounter.    History of Present Illness:    Tina Williams is a 62 y.o. female with a hx of atrial arrhythmia with frequent and symptomatic APCs suppressed with flecainide normal coronary arteriography and a coronary calcium score of 0 last seen 080/03/2022. Compliance with diet, lifestyle and medications: Yes  From a cardiology perspective is done well although her LDL is severely elevated coronary calcium score was 0 and does not have coronary atherosclerosis she is declined lipid-lowering treatment She checks her heart rhythm with the mobile Kardia occasionally uses a smart watch and has had no breakthrough episodes symptomatically are documented with her device No edema shortness of breath orthopnea chest pain palpitation or syncope Past Medical History:   Diagnosis Date   Allergic rhinoconjunctivitis 09/27/2017   Allergy    ANA positive 11/28/2017   ASTHMA 10/09/2008   HFA 75% p coaching  09/27/10     Replacing diagnoses that were inactivated after the 09/26/22 regulatory import     Early menopause    FOOT PAIN, BILATERAL 10/28/2009   Qualifier: Diagnosis of  By: Tina Low MD, Tina Williams medical examination 03/23/2011   H/O oralPollinosis 03/04/2015   Heart murmur    Irregular heart beat per pt and on medication   History of colonic polyps 11/26/2009   11/26/09 8 mm adenoma  IMO SNOMED Dx Update Oct 2024     Hx of adenomatous polyp of colon 01/11/2016   Hyperlipidemia    Mild persistent asthma without complication 09/27/2017   Moderate recurrent major depression (HCC) 01/06/2014   MYALGIA 11/24/2009   Qualifier: Diagnosis of  By: Tina Low MD, Tina Williams     Non-allergic rhinitis    Obesity (BMI 30-39.9) 07/09/2008   Qualifier: Diagnosis of  By: Tina Low MD, Tina Williams       Osteoarthritis    Osteoporosis 07/2018   T score -2.9   Pain in left ankle and joints of left foot 05/07/2018   Plantar fibromatosis    left foot   Polyarthralgia 10/25/2012   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  Rinse, gargle, and spit after use).   alendronate (FOSAMAX) 70 MG tablet TAKE 1 TABLET BY MOUTH EVERY 7 DAYS WITH FULL GLASS OF WATER ON EMPTY STOMACH (Patient taking differently: Take 70 mg by mouth once a week.)   AUVI-Q 0.3 MG/0.3ML SOAJ injection Use as directed for life-threatening allergic reaction. (Patient taking differently: Inject 0.3 mg into the muscle as needed for anaphylaxis. Use as directed for life-threatening allergic reaction.)   B Complex  Vitamins (VITAMIN B COMPLEX PO) Take 1 tablet by mouth daily.   CALCIUM PO Take 1 tablet by mouth daily.   Cholecalciferol (VITAMIN D3 PO) Take 1 tablet by mouth daily.   DULoxetine (CYMBALTA) 30 MG capsule TAKE 1 CAPSULE BY MOUTH EVERY DAY (Patient taking differently: Take 30 mg by mouth daily.)   flecainide (TAMBOCOR) 50 MG tablet TAKE 1 TABLET BY MOUTH TWICE A DAY   fluticasone (FLONASE) 50 MCG/ACT nasal spray Place 2 sprays into both nostrils daily.   montelukast (SINGULAIR) 10 MG tablet Take 1 tablet (10 mg total) by mouth daily.   Probiotic Product (PROBIOTIC PO) Take 1 tablet by mouth daily.      EKGs/Labs/Other Studies Reviewed:    The following studies were reviewed today:  Cardiac Studies & Procedures     STRESS TESTS  MYOCARDIAL PERFUSION IMAGING 01/19/2018  Narrative  Nuclear stress EF: 60%.  Blood pressure demonstrated a normal response to exercise.  There was no ST segment deviation noted during stress.  No T wave inversion was noted during stress.  This is a Williams risk study.  Visually there appears to be no reversible ischemia (although the computer had selected the basal to mid septum as abnormal by perfusion images).  LVEF 60% with normal wall motion. This is a Williams risk study.   ECHOCARDIOGRAM  ECHOCARDIOGRAM COMPLETE 12/20/2017  Narrative *[facility]* [address] [place], [place] [address] [phone]  ------------------------------------------------------------------- Transthoracic Echocardiography  Patient:    Tina Williams MR #:       [id number] Study Date: 12/20/2017 Gender:     F Age:        61 Height:     154 cm Weight:     78.9 kg BSA:        1.87 m^2 Pt. Status: Room:  ATTENDING    Tina Herrlich, MD ORDERING     Tina Herrlich, MD REFERRING    Tina Herrlich, MD PERFORMING   Tina Williams SONOGRAPHER  Tina Williams, RDCS  cc:  ------------------------------------------------------------------- LV EF: 60% -    65%  ------------------------------------------------------------------- History:   PMH:   Dyspnea.  Risk factors:  Hypertension. Diabetes mellitus.  ------------------------------------------------------------------- Study Conclusions  - Left ventricle: The cavity size was normal. Systolic function was normal. The estimated ejection fraction was in the range of 60% to 65%. Wall motion was normal; there were no regional wall motion abnormalities.  Impressions:  - 1. Left ventricle systolic function is preserved visually estimated ejection fraction is 60 to 65%. 2. An area of echodensity is visualized distal to the aortic valve. It appears related to the ascending aortic wall. This could be artifactual. Clinical correlation suggested. If indicated a transesophageal echocardiography may be appropriate.  ------------------------------------------------------------------- Study data:  No prior study was available for comparison.  Study status:  Routine.  Procedure:  Transthoracic echocardiography. Image quality was adequate.  Transthoracic echocardiography.  M-mode, complete 2D, spectral Doppler, and color Doppler.  Birthdate:  Patient birthdate: Jan 27, 1961.  Age:  Patient is 62 yr old.  Sex:  Gender: female.    BMI: 33.3 kg/m^2.  Blood pressure:     131/79  Patient status:  Outpatient.  Study date: Study date: 12/20/2017. Study time: 11:39 AM.  Location:  Echo laboratory.  -------------------------------------------------------------------  ------------------------------------------------------------------- Left ventricle:  The cavity size was normal. Systolic function was normal. The estimated ejection fraction was in the range of 60% to 65%. Wall motion was normal; there were no regional wall motion abnormalities.  ------------------------------------------------------------------- Aortic valve:   Trileaflet; normal thickness leaflets. Mobility was not restricted.   Doppler:  Transvalvular velocity was within the normal range. There was no stenosis. There was no regurgitation.  ------------------------------------------------------------------- Aorta:  Aortic root: The aortic root was normal in size.  ------------------------------------------------------------------- Mitral valve:   Structurally normal valve.   Mobility was not restricted.  Doppler:  Transvalvular velocity was within the normal range. There was no evidence for stenosis. There was no regurgitation.    Peak gradient (D): 3 mm Hg.  ------------------------------------------------------------------- Left atrium:  The atrium was normal in size.  ------------------------------------------------------------------- Right ventricle:  The cavity size was normal. Wall thickness was normal. Systolic function was normal.  ------------------------------------------------------------------- Pulmonic valve:    Doppler:  Transvalvular velocity was within the normal range. There was no evidence for stenosis.  ------------------------------------------------------------------- Tricuspid valve:   Structurally normal valve.    Doppler: Transvalvular velocity was within the normal range. There was no regurgitation.  ------------------------------------------------------------------- Pulmonary artery:   The main pulmonary artery was normal-sized. Systolic pressure was within the normal range.  ------------------------------------------------------------------- Right atrium:  The atrium was normal in size.  ------------------------------------------------------------------- Pericardium:  There was no pericardial effusion.  ------------------------------------------------------------------- Systemic veins: Inferior vena cava: The vessel was normal in size.  ------------------------------------------------------------------- Measurements  Left ventricle                        Value         Reference LV ID, ED, PLAX chordal               45.5  mm     43 - 52 LV ID, ES, PLAX chordal               27.5  mm     23 - 38 LV fx shortening, PLAX chordal        40    %      >=29 LV PW thickness, ED                   11    mm     ---------- IVS/LV PW ratio, ED                   1.05         <=1.3 Stroke volume, 2D                     70    ml     ---------- Stroke volume/bsa, 2D                 37    ml/m^2 ---------- LV e&', lateral                        9.68  cm/s   ---------- LV E/e&', lateral  8.67         ---------- LV e&', medial                         7.62  cm/s   ---------- LV E/e&', medial                       11.01        ---------- LV e&', average                        8.65  cm/s   ---------- LV E/e&', average                      9.7          ---------- Longitudinal strain, TDI              20    %      ----------  Ventricular septum                    Value        Reference IVS thickness, ED                     11.6  mm     ----------  LVOT                                  Value        Reference LVOT ID, S                            20    mm     ---------- LVOT area                             3.14  cm^2   ---------- LVOT peak velocity, S                 99.8  cm/s   ---------- LVOT mean velocity, S                 65.2  cm/s   ---------- LVOT VTI, S                           22.2  cm     ----------  Aorta                                 Value        Reference Aortic root ID, ED                    30    mm     ---------- Ascending aorta ID, A-P, S            28    mm     ---------- Descending aorta ID, proximal         20    mm     ----------  Left atrium                           Value  Reference LA ID, A-P, ES                        35    mm     ---------- LA ID/bsa, A-P                        1.87  cm/m^2 <=2.2 LA volume, S                          35.8  ml     ---------- LA volume/bsa, S                      19.1  ml/m^2  ---------- LA volume, ES, 1-p A4C                32.8  ml     ---------- LA volume/bsa, ES, 1-p A4C            17.5  ml/m^2 ---------- LA volume, ES, 1-p A2C                38.1  ml     ---------- LA volume/bsa, ES, 1-p A2C            20.3  ml/m^2 ----------  Mitral valve                          Value        Reference Mitral E-wave peak velocity           83.9  cm/s   ---------- Mitral A-wave peak velocity           86.8  cm/s   ---------- Mitral deceleration time       (H)    236   ms     150 - 230 Mitral peak gradient, D               3     mm Hg  ---------- Mitral E/A ratio, peak                1            ----------  Pulmonary arteries                    Value        Reference PA pressure, S, DP                    13    mm Hg  <=30  Tricuspid valve                       Value        Reference Tricuspid regurg peak velocity        158   cm/s   ---------- Tricuspid peak RV-RA gradient         10    mm Hg  ----------  Right atrium                          Value        Reference RA ID, S-I, ES, A4C                   43    mm     34 - 49 RA area, ES, A4C  11.2  cm^2   8.3 - 19.5 RA volume, ES, A/L                    24    ml     ---------- RA volume/bsa, ES, A/L                12.8  ml/m^2 ----------  Systemic veins                        Value        Reference Estimated CVP                         3     mm Hg  ----------  Right ventricle                       Value        Reference TAPSE                                 28.6  mm     ---------- RV pressure, S, DP                    13    mm Hg  <=30 RV s&', lateral, S                     12    cm/s   ----------  Legend: (L)  and  (H)  mark values outside specified reference range.  ------------------------------------------------------------------- Prepared and Electronically Authenticated by  Belva Crome, MD 2019-06-26T14:29:37    MONITORS  LONG TERM MONITOR (3-14 DAYS) 12/20/2017  Narrative Date  of test:                 12/20/2017 to 01/03/2018 Duration of test:           14 days Indication:                    Palpitation abnormal EKG Ordering physician:       Dr. Dulce Sellar Interpreting physician:   Dr. Dulce Sellar  Baseline rhythm: Sinus rhythm  Minimum heart rate 63 average heart rate 93 maximal heart rate 148 bpm  Atrial arrhythmia: 2.4% SVE burden no episodes of atrial fibrillation flutter or SVT  Ventricular arrhythmia: Less than 1% ventricular ectopic burden there are total of 11 PVCs 1 couplet and one 6 beat run of PVCs at a rate of 171 bpm  Conduction abnormality: None no episodes of sinus node or AV block  Symptoms: 3 symptomatic events palpitation associated with frequent APCs   Conclusion: Symptomatic frequent APCs and one episode of 6 beat run of PVCs   CT SCANS  CT CORONARY MORPH W/CTA COR W/SCORE 02/19/2020  Addendum 02/19/2020  2:00 PM ADDENDUM REPORT: 02/19/2020 13:58  EXAM: Cardiac/Coronary  CT  TECHNIQUE: The patient was scanned on a [employer].  FINDINGS: A 120 kV prospective scan was triggered in the descending thoracic aorta at 111 HU's. Axial non-contrast 3 mm slices were carried out through the heart. The data set was analyzed on a dedicated work station and scored using the Agatson method. Gantry rotation speed was 250 msecs and collimation was .6 mm. No beta blockade and 0.8 mg of sl NTG was given. The 3D data set was reconstructed in 5%  intervals of the 67-82 % of the R-R cycle. Diastolic phases were analyzed on a dedicated work station using MPR, MIP and VRT modes. The patient received 80 cc of contrast.  Aorta: Normal size. Scattered calcifications of the ascending thoracic aorta. No dissection.  Aortic Valve:  Trileaflet.  No calcifications.  Coronary Arteries:  Normal coronary origin. Left dominance.  RCA is a small non-dominant artery that gives rise to a RPLA branch. There is no plaque.  Left main is avery short artery  that gives rise to LAD and LCX arteries. There is no plaque.  LAD is a large vessel that has no plaque.  LCX is a dominant artery that gives rise to one large OM1 branch and the PDA. There is no plaque.  Other findings:  Normal pulmonary vein drainage into the left atrium.  Normal let atrial appendage without a thrombus.  Normal size of the pulmonary artery.  IMPRESSION: 1. Coronary calcium score of 0. This was 0 percentile for age and sex matched control.  2. Normal coronary origin with right dominance.  3. No evidence of CAD.  CAD RADS 0.  4. Consider non-atherosclerotic causes of chest pain.  Armanda Magic   Electronically Signed By: Armanda Magic On: 02/19/2020 13:58  Narrative EXAM: OVER-READ INTERPRETATION  CT CHEST  The following report is an over-read performed by radiologist Dr. Trudie Reed of [facility] on 02/19/2020. This over-read does not include interpretation of cardiac or coronary anatomy or pathology. The coronary calcium score/coronary CTA interpretation by the cardiologist is attached.  COMPARISON:  None.  FINDINGS: Aortic atherosclerosis. Within the visualized portions of the thorax there are no suspicious appearing pulmonary nodules or masses, there is no acute consolidative airspace disease, no pleural effusions, no pneumothorax and no lymphadenopathy. Visualized portions of the upper abdomen are unremarkable. There are no aggressive appearing lytic or blastic lesions noted in the visualized portions of the skeleton.  IMPRESSION: 1.  Aortic Atherosclerosis (ICD10-I70.0).  Electronically Signed: By: Trudie Reed M.D. On: 02/19/2020 13:04          EKG Interpretation Date/Time:  Wednesday April 19 2023 09:05:22 EDT Ventricular Rate:  77 PR Interval:  124 QRS Duration:  84 QT Interval:  394 QTC Calculation: 445 R Axis:   17  Text Interpretation: Normal sinus rhythm Possible Left atrial enlargement  Borderline ECG No previous ECGs available Confirmed by Tina Williams ([id number]) on 04/19/2023 9:09:51 AM    Recent Labs: 01/30/2023: BUN 13; Creatinine, Ser 0.67; Hemoglobin 13.6; Platelets 435.0; Potassium 4.3; Sodium 138; TSH 1.60 03/13/2023: ALT 19  Recent Lipid Panel    Component Value Date/Time   CHOL 301 (H) 01/30/2023 1308   TRIG 95.0 01/30/2023 1308   HDL 79.60 01/30/2023 1308   CHOLHDL 4 01/30/2023 1308   VLDL 19.0 01/30/2023 1308   LDLCALC 203 (H) 01/30/2023 1308   LDLDIRECT 161.6 03/23/2011 1331    Physical Exam:    VS:  BP 122/70 (BP Location: Left Arm, Patient Position: Sitting)   Pulse 77   Ht 5\' 1"  (1.549 m)   Wt 166 lb (75.3 kg)   SpO2 93%   BMI 31.37 kg/m     Wt Readings from Last 3 Encounters:  04/19/23 166 lb (75.3 kg)  01/30/23 163 lb 2 oz (74 kg)  04/14/22 158 lb 2 oz (71.7 kg)     GEN:  Well nourished, well developed in no acute distress HEENT: Normal NECK: No JVD; No carotid bruits LYMPHATICS: No lymphadenopathy CARDIAC: RRR,  no murmurs, rubs, gallops RESPIRATORY:  Clear to auscultation without rales, wheezing or rhonchi  ABDOMEN: Soft, non-tender, non-distended MUSCULOSKELETAL:  No edema; No deformity  SKIN: Warm and dry NEUROLOGIC:  Alert and oriented x 3 PSYCHIATRIC:  Normal affect    Signed, Tina Herrlich, MD  04/19/2023 9:23 AM    [facility]

## 2023-04-19 ENCOUNTER — Encounter: Payer: Self-pay | Admitting: Cardiology

## 2023-04-19 ENCOUNTER — Ambulatory Visit: Payer: 59 | Attending: Cardiology | Admitting: Cardiology

## 2023-04-19 VITALS — BP 122/70 | HR 77 | Ht 61.0 in | Wt 166.0 lb

## 2023-04-19 DIAGNOSIS — I491 Atrial premature depolarization: Secondary | ICD-10-CM | POA: Diagnosis not present

## 2023-04-19 DIAGNOSIS — Z79899 Other long term (current) drug therapy: Secondary | ICD-10-CM

## 2023-04-19 MED ORDER — FLECAINIDE ACETATE 50 MG PO TABS: 50.0000 mg | ORAL_TABLET | Freq: Two times a day (BID) | ORAL | 3 refills | Status: DC

## 2023-04-19 NOTE — Addendum Note (Signed)
Addended by: Roxanne Mins I on: 04/19/2023 09:40 AM   Modules accepted: Orders

## 2023-04-19 NOTE — Patient Instructions (Addendum)
Medication Instructions:  Your physician recommends that you continue on your current medications as directed. Please refer to the Current Medication list given to you today.  *If you need a refill on your cardiac medications before your next appointment, please call your pharmacy*   Lab Work: None If you have labs (blood work) drawn today and your tests are completely normal, you will receive your results only by: [place] (if you have MyChart) OR A paper copy in the mail If you have any lab test that is abnormal or we need to change your treatment, we will call you to review the results.   Testing/Procedures: None   Follow-Up: At [facility], you and your health needs are our priority.  As part of our continuing mission to provide you with exceptional heart care, we have created designated Provider Care Teams.  These Care Teams include your primary Cardiologist (physician) and Advanced Practice Providers (APPs -  Physician Assistants and Nurse Practitioners) who all work together to provide you with the care you need, when you need it.  We recommend signing up for the patient portal called "MyChart".  Sign up information is provided on this After Visit Summary.  MyChart is used to connect with patients for Virtual Visits (Telemedicine).  Patients are able to view lab/test results, encounter notes, upcoming appointments, etc.  Non-urgent messages can be sent to your provider as well.   To learn more about what you can do with MyChart, go to [email or web address].    Your next appointment:   1 year(s)  Provider:   Shirlee More, MD    Other Instructions None  1. Avoid all over-the-counter antihistamines except Claritin/Loratadine and Zyrtec/Cetrizine. 2. Avoid all combination including cold sinus allergies flu decongestant and sleep medications 3. You can use Robitussin DM Mucinex and Mucinex DM for cough. 4. can use Tylenol aspirin ibuprofen and naproxen but no  combinations such as sleep or sinus.

## 2023-06-25 ENCOUNTER — Other Ambulatory Visit: Payer: Self-pay | Admitting: Family Medicine

## 2023-06-26 NOTE — Telephone Encounter (Signed)
Chart supports rx. Last OV: 01/30/2023 Next OV: 01/31/2024

## 2023-06-30 ENCOUNTER — Other Ambulatory Visit: Payer: Self-pay | Admitting: Allergy and Immunology

## 2023-09-28 ENCOUNTER — Ambulatory Visit: Payer: BC Managed Care – PPO | Admitting: Allergy and Immunology

## 2023-10-19 ENCOUNTER — Ambulatory Visit: Admitting: Allergy and Immunology

## 2023-10-19 ENCOUNTER — Encounter: Payer: Self-pay | Admitting: Allergy and Immunology

## 2023-10-19 VITALS — BP 142/88 | HR 72 | Resp 14 | Ht 59.8 in | Wt 170.2 lb

## 2023-10-19 DIAGNOSIS — J4531 Mild persistent asthma with (acute) exacerbation: Secondary | ICD-10-CM | POA: Diagnosis not present

## 2023-10-19 DIAGNOSIS — T781XXD Other adverse food reactions, not elsewhere classified, subsequent encounter: Secondary | ICD-10-CM | POA: Diagnosis not present

## 2023-10-19 DIAGNOSIS — J3089 Other allergic rhinitis: Secondary | ICD-10-CM | POA: Diagnosis not present

## 2023-10-19 MED ORDER — MONTELUKAST SODIUM 10 MG PO TABS: 10.0000 mg | ORAL_TABLET | Freq: Every day | ORAL | 3 refills | Status: AC

## 2023-10-19 MED ORDER — ALBUTEROL SULFATE (2.5 MG/3ML) 0.083% IN NEBU: INHALATION_SOLUTION | RESPIRATORY_TRACT | 1 refills | Status: AC

## 2023-10-19 MED ORDER — QVAR REDIHALER 40 MCG/ACT IN AERB: INHALATION_SPRAY | RESPIRATORY_TRACT | 1 refills | Status: DC

## 2023-10-19 MED ORDER — AIRSUPRA 90-80 MCG/ACT IN AERO: 2.0000 | INHALATION_SPRAY | RESPIRATORY_TRACT | 1 refills | Status: AC | PRN

## 2023-10-19 NOTE — Progress Notes (Signed)
 [place] - [place] - [place] - [place] - [place]   Follow-up Note  Referring Provider: Jess Morita, MD Primary Provider: Jess Morita, MD Date of Office Visit: 10/19/2023  Subjective:   Tina Williams (DOB: [birth year]) is a 63 y.o. female who [facility] in re-evaluation of the following:  HPI: [name] returns to this clinic in evaluation of asthma, allergic rhinitis, oral allergy syndrome.  I last saw her in this clinic 10 October 2022.  She has really done well with her asthma since her last visit other than the fact that she contracted influenza in February 2025 which gave rise to some asthma issues and she ended up in urgent care center and took some prednisone .  Apparently she did not receive Tamiflu with that episode.  And she has noticed over the course of the past 2 weeks that she has developed a little bit of cough with some "junk" in her chest but she took some Mucinex  and most of that has resolved.  However, it does look as though whenever she stops her Mucinex  she may get a little bit of cough.  She has been using her Airsupra  about 1 time per day for the past 2 weeks.  She has had very little problems with her upper airways.  She is very careful about remaining away from foods that give rise to oral itching in the context of her oral allergy syndrome.  She slowly tapered off her Zyrtec  this winter and she noticed that when she discontinued this agent she was itchy for a little while but that has since resolved.  She is leery about using Zyrtec  on a long-term basis because of that dependency that she developed and the pruritic withdrawal issues she developed when coming off her Zyrtec .  Allergies as of 10/19/2023       Reactions   Latex Other (See Comments)   Throat swelling, chest tightness   Amoxicillin Hives   Clarithromycin    REACTION: hives   Naproxen Sodium    REACTION: HIVES   Penicillins     REACTION: hives   Sulfa Antibiotics Hives        Medication List    Airsupra  90-80 MCG/ACT Aero Generic drug: Albuterol -Budesonide  INHALE 2 PUFFS INTO THE LUNGS AS NEEDED (EVERY FOUR TO SIX HOURS FOR COUGH, WHEEZE, SHORTNESS OF BREATH. RINSE, GARGLE, AND SPIT AFTER USE).   albuterol  108 (90 Base) MCG/ACT inhaler Commonly known as: VENTOLIN  HFA Inhale 2 puffs into the lungs 4 (four) times daily.   alendronate  70 MG tablet Commonly known as: FOSAMAX  TAKE 1 TABLET BY MOUTH EVERY 7 DAYS WITH FULL GLASS OF WATER ON EMPTY STOMACH   Auvi-Q  0.3 MG/0.3ML Soaj injection Generic drug: EPINEPHrine  Use as directed for life-threatening allergic reaction.   CALCIUM  PO Take 1 tablet by mouth daily.   cetirizine  10 MG tablet Commonly known as: ZYRTEC  Take 10 mg by mouth daily as needed.   DULoxetine  30 MG capsule Commonly known as: CYMBALTA  TAKE 1 CAPSULE BY MOUTH EVERY DAY   flecainide  50 MG tablet Commonly known as: TAMBOCOR  Take 1 tablet (50 mg total) by mouth 2 (two) times daily.   fluticasone  50 MCG/ACT nasal spray Commonly known as: FLONASE  Place 2 sprays into both nostrils daily.   montelukast  10 MG tablet Commonly known as: SINGULAIR  Take 1 tablet (10 mg total) by mouth daily.   NUTRITIONAL SUPPLEMENT PO Take by mouth daily. All Season supplement for allergies  PROBIOTIC PO Take 1 tablet by mouth daily.   VITAMIN B COMPLEX PO Take 1 tablet by mouth daily.   VITAMIN D3 PO Take 1 tablet by mouth daily.    Past Medical History:  Diagnosis Date   Allergic rhinoconjunctivitis 09/27/2017   Allergy    ANA positive 11/28/2017   ASTHMA 10/09/2008   HFA 75% p coaching  09/27/10     Replacing diagnoses that were inactivated after the 09/26/22 regulatory import     Early menopause    FOOT PAIN, BILATERAL 10/28/2009   Qualifier: Diagnosis of  By: Paulla Bossier MD, Rondall Codding medical examination 03/23/2011   H/O oralPollinosis 03/04/2015   Heart murmur    Irregular  heart beat per pt and on medication   History of colonic polyps 11/26/2009   11/26/09 8 mm adenoma  IMO SNOMED Dx Update Oct 2024     Hx of adenomatous polyp of colon 01/11/2016   Hyperlipidemia    Mild persistent asthma without complication 09/27/2017   Moderate recurrent major depression (HCC) 01/06/2014   MYALGIA 11/24/2009   Qualifier: Diagnosis of  By: Paulla Bossier MD, Dana Duncan     Non-allergic rhinitis    Obesity (BMI 30-39.9) 07/09/2008   Qualifier: Diagnosis of  By: Paulla Bossier MD, Katherine       Osteoarthritis    Osteoporosis 07/2018   T score -2.9   Pain in left ankle and joints of left foot 05/07/2018   Plantar fibromatosis    left foot   Polyarthralgia 10/25/2012   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   HENT: Negative.    Eyes: Negative.   Respiratory: Negative.    Cardiovascular: Negative.   Gastrointestinal: Negative.   Genitourinary: Negative.   Musculoskeletal: Negative.   Skin: Negative.   Neurological: Negative.   Endo/Heme/Allergies: Negative.   Psychiatric/Behavioral: Negative.       Objective:   Vitals:   10/19/23 1048  BP: (!) 142/88  Pulse: 72  Resp: 14  SpO2: 97%   Height: 4' 11.8" (151.9 cm)  Weight: 170 lb 3.2 oz (77.2 kg)   Physical Exam Constitutional:      Appearance: She is not diaphoretic.  HENT:     Head: Normocephalic.     Right Ear: Tympanic membrane, ear canal and external ear normal.     Left Ear:  Tympanic membrane, ear canal and external ear normal.     Nose: Nose normal. No mucosal edema or rhinorrhea.     Mouth/Throat:     Pharynx: Uvula midline. No oropharyngeal exudate.  Eyes:     Conjunctiva/sclera: Conjunctivae normal.  Neck:     Thyroid : No thyromegaly.     Trachea: Trachea normal. No tracheal tenderness or tracheal deviation.  Cardiovascular:     Rate and Rhythm: Normal rate and regular rhythm.     Heart sounds: Normal heart sounds, S1 normal and S2 normal. No murmur  heard. Pulmonary:     Effort: No respiratory distress.     Breath sounds: Normal breath sounds. No stridor. No wheezing or rales.  Lymphadenopathy:     Head:     Right side of head: No tonsillar adenopathy.     Left side of head: No tonsillar adenopathy.     Cervical: No cervical adenopathy.  Skin:    Findings: No erythema or rash.     Nails: There is no clubbing.  Neurological:     Mental Status: She is alert.     Diagnostics: Spirometry was performed and demonstrated an FEV1 of 1.85 at 89 % of predicted.  Her previous FEV1 was 2.08  Assessment and Plan:   1. Asthma, not well controlled, mild persistent, with acute exacerbation   2. Perennial allergic rhinitis   3. Oral allergy syndrome, subsequent encounter     1. Continue Montelukast  10mg  one tablet one time per day  2. Continue Flonase  - 1-2 sprays each nostril 1 time per day  3. Continue the following if needed:  A. Epi-Pen  B. AirSupra  - 2 inhalations every 4-6 hours  C. Albuterol  nebulization every 4-6 hours D. Zyrtec  10 mg - 1/2 - 1  tablet 1 time per day  4. For this recent event:    A. Prednisone  10 mg - 1 tablet 1 time per day for 5 days only  B. Start Qvar  40  - 2 inhalations 2 times per day for 4 weeks  5. Return in 12 months or earlier if problem  6. Influenza = Tamiflu. Covid = molnupiravir  [name] appears to have a slight exacerbation of her asthma and we will treat her with the therapy noted above and hopefully this  will clear up her springtime asthma activity and she can go back to using just montelukast  and Flonase  to address her atopic respiratory disease.  She will keep in contact with me noting her response to this approach.  If she does well I will see her back in this clinic in 1 year or earlier if there is a problem.  Schuyler Custard, MD Allergy / Immunology [facility]

## 2023-10-19 NOTE — Patient Instructions (Addendum)
  1. Continue Montelukast  10mg  one tablet one time per day  2. Continue Flonase  - 1-2 sprays each nostril 1 time per day  3. Continue the following if needed:  A. Epi-Pen  B. AirSupra  - 2 inhalations every 4-6 hours  C. Albuterol  nebulization every 4-6 hours D. Zyrtec  10 mg - 1/2 - 1  tablet 1 time per day  4. For this recent event:    A. Prednisone  10 mg - 1 tablet 1 time per day for 5 days only  B. Start Qvar  40  - 2 inhalations 2 times per day for 4 weeks  5. Return in 12 months or earlier if problem  6. Influenza = Tamiflu. Covid = molnupiravir

## 2023-10-21 ENCOUNTER — Other Ambulatory Visit: Payer: Self-pay | Admitting: Family Medicine

## 2023-10-23 ENCOUNTER — Encounter: Payer: Self-pay | Admitting: Allergy and Immunology

## 2024-01-31 ENCOUNTER — Encounter: Payer: Self-pay | Admitting: Family Medicine

## 2024-01-31 ENCOUNTER — Ambulatory Visit: Payer: 59 | Admitting: Family Medicine

## 2024-01-31 VITALS — BP 122/78 | HR 66 | Temp 98.3°F | Ht 59.8 in | Wt 169.5 lb

## 2024-01-31 DIAGNOSIS — Z0000 Encounter for general adult medical examination without abnormal findings: Secondary | ICD-10-CM

## 2024-01-31 DIAGNOSIS — Z124 Encounter for screening for malignant neoplasm of cervix: Secondary | ICD-10-CM | POA: Diagnosis not present

## 2024-01-31 DIAGNOSIS — E559 Vitamin D deficiency, unspecified: Secondary | ICD-10-CM

## 2024-01-31 DIAGNOSIS — E669 Obesity, unspecified: Secondary | ICD-10-CM

## 2024-01-31 LAB — BASIC METABOLIC PANEL WITH GFR
BUN: 14 mg/dL (ref 6–23)
CO2: 29 meq/L (ref 19–32)
Calcium: 9.2 mg/dL (ref 8.4–10.5)
Chloride: 103 meq/L (ref 96–112)
Creatinine, Ser: 0.62 mg/dL (ref 0.40–1.20)
GFR: 94.95 mL/min (ref >60.00)
Glucose, Bld: 99 mg/dL (ref 70–99)
Potassium: 4.1 meq/L (ref 3.5–5.1)
Sodium: 138 meq/L (ref 135–145)

## 2024-01-31 LAB — CBC WITH DIFFERENTIAL/PLATELET
Basophils Absolute: 0 K/uL (ref 0.0–0.1)
Basophils Relative: 0.8 % (ref 0.0–3.0)
Eosinophils Absolute: 0.3 K/uL (ref 0.0–0.7)
Eosinophils Relative: 4.7 % (ref 0.0–5.0)
HCT: 41.7 % (ref 36.0–46.0)
Hemoglobin: 13.4 g/dL (ref 12.0–15.0)
Lymphocytes Relative: 26.4 % (ref 12.0–46.0)
Lymphs Abs: 1.6 K/uL (ref 0.7–4.0)
MCHC: 32.2 g/dL (ref 30.0–36.0)
MCV: 86.2 fl (ref 78.0–100.0)
Monocytes Absolute: 0.5 K/uL (ref 0.1–1.0)
Monocytes Relative: 8.7 % (ref 3.0–12.0)
Neutro Abs: 3.6 K/uL (ref 1.4–7.7)
Neutrophils Relative %: 59.4 % (ref 43.0–77.0)
Platelets: 442 K/uL — ABNORMAL HIGH (ref 150.0–400.0)
RBC: 4.84 Mil/uL (ref 3.87–5.11)
RDW: 15.6 % — ABNORMAL HIGH (ref 11.5–15.5)
WBC: 6.1 K/uL (ref 4.0–10.5)

## 2024-01-31 LAB — LIPID PANEL
Cholesterol: 253 mg/dL — ABNORMAL HIGH (ref 0–200)
HDL: 70.7 mg/dL (ref >39.00)
LDL Cholesterol: 164 mg/dL — ABNORMAL HIGH (ref 0–99)
NonHDL: 182.29
Total CHOL/HDL Ratio: 4
Triglycerides: 89 mg/dL (ref 0.0–149.0)
VLDL: 17.8 mg/dL (ref 0.0–40.0)

## 2024-01-31 LAB — HEPATIC FUNCTION PANEL
ALT: 24 U/L (ref 0–35)
AST: 21 U/L (ref 0–37)
Albumin: 4.5 g/dL (ref 3.5–5.2)
Alkaline Phosphatase: 61 U/L (ref 39–117)
Bilirubin, Direct: 0.1 mg/dL (ref 0.0–0.3)
Total Bilirubin: 0.7 mg/dL (ref 0.2–1.2)
Total Protein: 7.4 g/dL (ref 6.0–8.3)

## 2024-01-31 LAB — TSH: TSH: 1.56 u[IU]/mL (ref 0.35–5.50)

## 2024-01-31 LAB — HEMOGLOBIN A1C: Hgb A1c MFr Bld: 5.5 % (ref 4.6–6.5)

## 2024-01-31 LAB — VITAMIN D 25 HYDROXY (VIT D DEFICIENCY, FRACTURES): VITD: 61.33 ng/mL (ref 30.00–100.00)

## 2024-01-31 NOTE — Assessment & Plan Note (Signed)
 Pt's PE WNL w/ exception of BMI.  UTD on pap, mammo, colonoscopy.  Due for repeat DEXA.  Prefers to do this w/ new GYN.  Check labs.  Anticipatory guidance provided.

## 2024-01-31 NOTE — Progress Notes (Signed)
   Subjective:    Patient ID: Tina Williams, female    DOB: [birth year], 63 y.o.   MRN: [id number]  HPI CPE- due for pap.  UTD on mammo, colonoscopy, Tdap.  Due for repeat DEXA.  Patient Care Team    Relationship Specialty Notifications Start End  Mahlon Comer BRAVO, MD PCP - General Family Medicine  10/20/21   Avram Lupita BRAVO, MD Consulting Physician Gastroenterology  10/23/15   Maurilio Camellia PARAS, MD Consulting Physician Allergy and Immunology  10/28/16   Arnaldo Purchase, MD (Inactive)  Obstetrics and Gynecology  10/20/21     Health Maintenance  Topic Date Due   Pneumococcal Vaccine: 19-49 Years (1 of 2 - PCV) Never done   Pneumococcal Vaccine: 50+ Years (1 of 2 - PCV) Never done   Cervical Cancer Screening (HPV/Pap Cotest)  08/02/2023   INFLUENZA VACCINE  01/26/2024   MAMMOGRAM  02/06/2024   DTaP/Tdap/Td (2 - Td or Tdap) 10/22/2025   Colonoscopy  09/01/2026   Hepatitis C Screening  Completed   HIV Screening  Completed   Hepatitis B Vaccines  Aged Out   HPV VACCINES  Aged Out   Meningococcal B Vaccine  Aged Out   COVID-19 Vaccine  Discontinued   Zoster Vaccines- Shingrix  Discontinued      Review of Systems Patient reports no vision/ hearing changes, adenopathy,fever, weight change,  persistant/recurrent hoarseness , swallowing issues, chest pain, palpitations, edema, persistant/recurrent cough, hemoptysis, dyspnea (rest/exertional/paroxysmal nocturnal), gastrointestinal bleeding (melena, rectal bleeding), abdominal pain, significant heartburn, bowel changes, GU symptoms (dysuria, hematuria, incontinence), Gyn symptoms (abnormal  bleeding, pain),  syncope, focal weakness, memory loss, numbness & tingling, skin/hair/nail changes, abnormal bruising or bleeding, anxiety, or depression.     Objective:   Physical Exam General Appearance:    Alert, cooperative, no distress, appears stated age  Head:    Normocephalic, without obvious abnormality, atraumatic  Eyes:    PERRL,  conjunctiva/corneas clear, EOM's intact both eyes  Ears:    Normal TM's and external ear canals, both ears  Nose:   Nares normal, septum midline, mucosa normal, no drainage    or sinus tenderness  Throat:   Lips, mucosa, and tongue normal; teeth and gums normal  Neck:   Supple, symmetrical, trachea midline, no adenopathy;    Thyroid : no enlargement/tenderness/nodules  Back:     Symmetric, no curvature, ROM normal, no CVA tenderness  Lungs:     Clear to auscultation bilaterally, respirations unlabored  Chest Wall:    No tenderness or deformity   Heart:    Regular rate and rhythm, S1 and S2 normal, no murmur, rub   or gallop  Breast Exam:    Deferred to GYN  Abdomen:     Soft, non-tender, bowel sounds active all four quadrants,    no masses, no organomegaly  Genitalia:    Deferred to GYN  Rectal:    Extremities:   Extremities normal, atraumatic, no cyanosis or edema  Pulses:   2+ and symmetric all extremities  Skin:   Skin color, texture, turgor normal, no rashes or lesions  Lymph nodes:   Cervical, supraclavicular, and axillary nodes normal  Neurologic:   CNII-XII intact, normal strength, sensation and reflexes    throughout          Assessment & Plan:

## 2024-01-31 NOTE — Patient Instructions (Signed)
 Follow up in 1 year or as needed We'll notify you of your lab results and make any changes if needed Keep up the good work on healthy diet and regular exercise- you're doing great! We'll call you to schedule your GYN appt Call with any questions or concerns Stay Safe!  Stay Healthy! Enjoy the rest of your summer!!!

## 2024-02-01 ENCOUNTER — Ambulatory Visit: Payer: Self-pay | Admitting: Family Medicine

## 2024-03-07 ENCOUNTER — Encounter: Payer: Self-pay | Admitting: Family Medicine

## 2024-03-07 ENCOUNTER — Other Ambulatory Visit (HOSPITAL_COMMUNITY)
Admission: RE | Admit: 2024-03-07 | Discharge: 2024-03-07 | Disposition: A | Discharge status: Home / Self Care | Source: Ambulatory Visit | Attending: Family Medicine | Admitting: Family Medicine

## 2024-03-07 ENCOUNTER — Ambulatory Visit: Admitting: Family Medicine

## 2024-03-07 VITALS — BP 133/72 | HR 73 | Ht 60.0 in | Wt 171.8 lb

## 2024-03-07 DIAGNOSIS — Z1231 Encounter for screening mammogram for malignant neoplasm of breast: Secondary | ICD-10-CM

## 2024-03-07 DIAGNOSIS — Z01419 Encounter for gynecological examination (general) (routine) without abnormal findings: Secondary | ICD-10-CM | POA: Insufficient documentation

## 2024-03-07 DIAGNOSIS — Z780 Asymptomatic menopausal state: Secondary | ICD-10-CM | POA: Diagnosis not present

## 2024-03-07 DIAGNOSIS — Z124 Encounter for screening for malignant neoplasm of cervix: Secondary | ICD-10-CM | POA: Insufficient documentation

## 2024-03-07 DIAGNOSIS — M810 Age-related osteoporosis without current pathological fracture: Secondary | ICD-10-CM

## 2024-03-07 NOTE — Progress Notes (Signed)
 ANNUAL EXAM Patient name: Tina Williams MRN [id number]  Date of birth: [birth year] Chief Complaint:   No chief complaint on file.  History of Present Illness:   Tina Williams is a 63 y.o. [id number] Caucasian female being seen today for a routine annual exam.  Current complaints: needs updated pap smear. Pt requesting referral for bone density test for history of osteoporosis.  No LMP recorded. Patient is postmenopausal.   The pregnancy intention screening data noted above was reviewed. Potential methods of contraception were discussed. The patient elected to proceed with No data recorded.   Last pap 07/2018. Results were: NILM w/ HRHPV negative. H/O abnormal pap: no Last mammogram: 01/2023. Results were: normal. Family h/o breast cancer: no Last colonoscopy: 2023. Results were: abnormal with polyps found, recommendation to repeat in 5 years. Family h/o colorectal cancer: yes - sister     01/31/2024   12:43 PM 01/30/2023   12:44 PM 04/14/2022   10:18 AM 10/20/2021    9:56 AM 10/19/2020   10:00 AM  Depression screen PHQ 2/9  Decreased Interest 0 0 0 0 0  Down, Depressed, Hopeless 0 0 0 0 0  PHQ - 2 Score 0 0 0 0 0  Altered sleeping 1 0 0 0 0  Tired, decreased energy 1 1 1  0 0  Change in appetite 0 1 0 0 0  Feeling bad or failure about yourself  0 0 0 0 0  Trouble concentrating 0 0 0 0 0  Moving slowly or fidgety/restless 0 0 0 0 0  Suicidal thoughts 0 0 0 0 0  PHQ-9 Score 2 2 1  0 0  Difficult doing work/chores  Not difficult at all Not difficult at all Not difficult at all Not difficult at all        01/31/2024   12:43 PM 01/30/2023   12:44 PM  GAD 7 : Generalized Anxiety Score  Nervous, Anxious, on Edge 0 0  Control/stop worrying 0 0  Worry too much - different things 0 0  Trouble relaxing 0 0  Restless 0 0  Easily annoyed or irritable 0 0  Afraid - awful might happen 0 0  Total GAD 7 Score 0 0  Anxiety Difficulty Not difficult at all Not difficult at all     Review of  Systems:   Pertinent items are noted in HPI Denies any headaches, blurred vision, fatigue, shortness of breath, chest pain, abdominal pain, abnormal vaginal discharge/itching/odor/irritation, problems with periods, bowel movements, urination, or intercourse unless otherwise stated above.  Pertinent History Reviewed:  Reviewed past medical,surgical, social and family history.  Reviewed problem list, medications and allergies.  Physical Assessment:   Vitals:   03/07/24 1040  BP: 133/72  Pulse: 73  Weight: 171 lb 12.8 oz (77.9 kg)  Height: 5' (1.524 m)   Body mass index is 33.55 kg/m.   Physical Examination:  General appearance - well appearing, and in no distress Mental status - alert, oriented to person, place, and time Psych:  She has a normal mood and affect Skin - warm and dry, normal color, no suspicious lesions noted Chest - effort normal, no problems with respiration noted Heart - normal rate and regular rhythm Neck:  midline trachea, no thyromegaly or nodules Breasts - breasts appear normal, no suspicious masses, no skin or nipple changes or  axillary nodes Abdomen - soft, nontender, nondistended, no masses or organomegaly Pelvic - VULVA: normal appearing vulva with no masses, tenderness or lesions   VAGINA: normal appearing  vagina with normal color and discharge, no lesions   CERVIX: normal appearing cervix without discharge or lesions, no CMT Thin prep pap is done with HR HPV cotesting Extremities:  No swelling or varicosities noted  Chaperone present for exam  No results found for this or any previous visit (from the past 24 hours).  Assessment & Plan:  1. Encounter for well woman exam (Primary) - Cytology - PAP( [place])  2. Screening for cervical cancer - Cytology - PAP( [place])  3. Screening mammogram for breast cancer - MM 3D SCREENING MAMMOGRAM BILATERAL BREAST; Future  4. Osteoporosis without current pathological fracture, unspecified  osteoporosis type - DG Bone Density; Future  5. Postmenopausal estrogen deficiency - DG Bone Density; Future  - Will follow up results of pap smear and manage accordingly. - Mammogram ordered - Colon cancer screening is up to date - Routine preventative health maintenance measures emphasized. - Please refer to After Visit Summary for other counseling recommendations.       Orders Placed This Encounter  Procedures   DG Bone Density   MM 3D SCREENING MAMMOGRAM BILATERAL BREAST    Meds: No orders of the defined types were placed in this encounter.   Follow-up: Return in about 1 year (around 03/07/2025) for [place].  Joesph DELENA Sear, PA

## 2024-03-07 NOTE — Progress Notes (Signed)
 Pt presents for AEX  Last PAP 07/2018 Mammogram 01/2023 Pt has colonoscopy every 5 years  Declines STD testing   Pt requesting referral for bone density test

## 2024-03-13 ENCOUNTER — Other Ambulatory Visit: Payer: Self-pay | Admitting: Family Medicine

## 2024-03-13 LAB — CYTOLOGY - PAP
Comment: NEGATIVE
Diagnosis: NEGATIVE
High risk HPV: NEGATIVE

## 2024-03-15 ENCOUNTER — Ambulatory Visit: Payer: Self-pay | Admitting: Family Medicine

## 2024-04-15 ENCOUNTER — Other Ambulatory Visit: Payer: Self-pay | Admitting: Cardiology

## 2024-04-23 ENCOUNTER — Other Ambulatory Visit: Payer: Self-pay

## 2024-04-23 NOTE — Progress Notes (Unsigned)
 Cardiology Office Note:    Date:  04/24/2024   ID:  Tina Williams, DOB 10-25-1960, MRN [id number]  PCP:  Mahlon Comer BRAVO, MD  Cardiologist:  Redell Leiter, MD    Referring MD: Mahlon Comer BRAVO, MD    ASSESSMENT:    1. APC (atrial premature contractions)   2. High risk medication use    PLAN:    In order of problems listed above:  She continues to have a good result from very low-dose flecainide  which is suppressed symptomatic APCs No suspicion of toxicity I do not think she needs to have a treadmill stress test performed She understands we will continue to avoid over-the-counter proarrhythmic drugs She had a coronary calcium  score of 0 in 2021 typically the warranty is in the range of 5 to 7 years if LDL remains severely elevated and recheck a coronary calcium  score next year   Next appointment: I feel comfortable seeing her in 1 year   Medication Adjustments/Labs and Tests Ordered: Current medicines are reviewed at length with the patient today.  Concerns regarding medicines are outlined above.  No orders of the defined types were placed in this encounter.  No orders of the defined types were placed in this encounter.    History of Present Illness:    Tina Williams is a 63 y.o. female with a hx of atrial arrhythmia with frequent and symptomatic APCs suppressed with flecainide  normal coronary arteriography and a coronary calcium  score of 0 last seen 04/19/2023.  Compliance with diet, lifestyle and medications: Yes  She has had a good year little or no palpitations screened herself with a mobile cardiac no extra beats She had no chest pain edema shortness of breath palpitation or syncope Recent labs August cholesterol 253 LDL 164 able to see 5.4 creatinine 0.6 Past Medical History:  Diagnosis Date   Allergic rhinoconjunctivitis 09/27/2017   Allergy    ANA positive 11/28/2017   ASTHMA 10/09/2008   HFA 75% p coaching  09/27/10     Replacing diagnoses that were  inactivated after the 09/26/22 regulatory import     Bilateral sensorineural hearing loss 06/02/2022   Early menopause    FOOT PAIN, BILATERAL 10/28/2009   Qualifier: Diagnosis of  By: Mahlon MD, Comer Broom medical examination 03/23/2011   H/O oralPollinosis 03/04/2015   Heart murmur    Irregular heart beat per pt and on medication   History of colonic polyps 11/26/2009   11/26/09 8 mm adenoma  IMO SNOMED Dx Update Oct 2024     Hx of adenomatous polyp of colon 01/11/2016   Hyperlipidemia    Mild persistent asthma without complication 09/27/2017   Moderate recurrent major depression (HCC) 01/06/2014   Non-allergic rhinitis    Obesity (BMI 30-39.9) 07/09/2008   Qualifier: Diagnosis of  By: Mahlon MD, Katherine       Osteoarthritis    Osteoporosis 07/2018   T score -2.9   Pain in left ankle and joints of left foot 05/07/2018   Plantar fibromatosis    left foot   Polyarthralgia 10/25/2012   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      EKGs/Labs/Other Studies Reviewed:    The following studies were reviewed today:  Cardiac Studies & Procedures   ______________________________________________________________________________________________  STRESS TESTS  MYOCARDIAL PERFUSION IMAGING 01/19/2018  Interpretation Summary  Nuclear stress EF: 60%.  Blood pressure demonstrated a normal response to exercise.  There was no ST segment deviation noted during stress.  No T wave inversion was noted during stress.  This is a low risk study.  Visually there appears to be no reversible ischemia (although the computer had selected the basal to mid septum as abnormal by perfusion images).  LVEF 60% with normal wall motion. This is  a low risk study.   ECHOCARDIOGRAM  ECHOCARDIOGRAM COMPLETE 12/20/2017  Narrative *[facility]* [address] [place], [place] [address] [phone]  ------------------------------------------------------------------- Transthoracic Echocardiography  Patient:    Tina, Williams MR #:       [id number] Study Date: 12/20/2017 Gender:     F Age:        68 Height:     154 cm Weight:     78.9 kg BSA:        1.87 m^2 Pt. Status: Room:  ATTENDING    Redell Leiter, MD ORDERING     Redell Leiter, MD REFERRING    Redell Leiter, MD PERFORMING   [facility] [place] [facility] SONOGRAPHER  Jimmy Reel, RDCS  cc:  ------------------------------------------------------------------- LV EF: 60% -   65%  ------------------------------------------------------------------- History:   PMH:   Dyspnea.  Risk factors:  Hypertension. Diabetes mellitus.  ------------------------------------------------------------------- Study Conclusions  - Left ventricle: The cavity size was normal. Systolic function was normal. The estimated ejection fraction was in the range of 60% to 65%. Wall motion was normal; there were no regional wall motion abnormalities.  Impressions:  - 1. Left ventricle systolic function is preserved visually estimated ejection fraction is 60 to 65%. 2. An area of echodensity is visualized distal to the aortic valve. It appears related to the ascending aortic wall. This could be artifactual. Clinical correlation suggested. If indicated a transesophageal echocardiography may be appropriate.  ------------------------------------------------------------------- Study data:  No prior study was available for comparison.  Study status:  Routine.  Procedure:  Transthoracic echocardiography. Image quality was adequate.          Transthoracic echocardiography.  M-mode, complete 2D, spectral Doppler, and color Doppler.  Birthdate:  Patient birthdate: 03-May-1961.  Age:   Patient is 63 yr old.  Sex:  Gender: female.    BMI: 33.3 kg/m^2.  Blood pressure:     131/79  Patient status:  Outpatient.  Study date: Study date: 12/20/2017. Study time: 11:39 AM.  Location:  Echo laboratory.  -------------------------------------------------------------------  ------------------------------------------------------------------- Left ventricle:  The cavity size was normal. Systolic function was normal. The estimated ejection fraction was in the range of 60% to 65%. Wall motion was normal; there were no regional wall motion abnormalities.  ------------------------------------------------------------------- Aortic valve:   Trileaflet; normal thickness leaflets. Mobility was not restricted.  Doppler:  Transvalvular velocity was within the normal range. There was no stenosis. There was no regurgitation.  ------------------------------------------------------------------- Aorta:  Aortic root: The aortic root was normal in size.  ------------------------------------------------------------------- Mitral valve:   Structurally normal valve.   Mobility was not restricted.  Doppler:  Transvalvular velocity was within the normal range. There was no evidence for stenosis. There was no regurgitation.    Peak gradient (D): 3 mm Hg.  ------------------------------------------------------------------- Left atrium:  The atrium was normal in size.  ------------------------------------------------------------------- Right ventricle:  The cavity size was normal. Wall thickness was normal. Systolic function was normal.  ------------------------------------------------------------------- Pulmonic valve:    Doppler:  Transvalvular velocity was  within the normal range. There was no evidence for stenosis.  ------------------------------------------------------------------- Tricuspid valve:   Structurally normal valve.    Doppler: Transvalvular velocity was within the normal range.  There was no regurgitation.  ------------------------------------------------------------------- Pulmonary artery:   The main pulmonary artery was normal-sized. Systolic pressure was within the normal range.  ------------------------------------------------------------------- Right atrium:  The atrium was normal in size.  ------------------------------------------------------------------- Pericardium:  There was no pericardial effusion.  ------------------------------------------------------------------- Systemic veins: Inferior vena cava: The vessel was normal in size.  ------------------------------------------------------------------- Measurements  Left ventricle                        Value        Reference LV ID, ED, PLAX chordal               45.5  mm     43 - 52 LV ID, ES, PLAX chordal               27.5  mm     23 - 38 LV fx shortening, PLAX chordal        40    %      >=29 LV PW thickness, ED                   11    mm     ---------- IVS/LV PW ratio, ED                   1.05         <=1.3 Stroke volume, 2D                     70    ml     ---------- Stroke volume/bsa, 2D                 37    ml/m^2 ---------- LV e&', lateral                        9.68  cm/s   ---------- LV E/e&', lateral                      8.67         ---------- LV e&', medial                         7.62  cm/s   ---------- LV E/e&', medial                       11.01        ---------- LV e&', average                        8.65  cm/s   ---------- LV E/e&', average                      9.7          ---------- Longitudinal strain, TDI              20    %      ----------  Ventricular septum                    Value        Reference IVS thickness, ED  11.6  mm     ----------  LVOT                                  Value        Reference LVOT ID, S                            20    mm     ---------- LVOT area                             3.14  cm^2   ---------- LVOT peak velocity,  S                 99.8  cm/s   ---------- LVOT mean velocity, S                 65.2  cm/s   ---------- LVOT VTI, S                           22.2  cm     ----------  Aorta                                 Value        Reference Aortic root ID, ED                    30    mm     ---------- Ascending aorta ID, A-P, S            28    mm     ---------- Descending aorta ID, proximal         20    mm     ----------  Left atrium                           Value        Reference LA ID, A-P, ES                        35    mm     ---------- LA ID/bsa, A-P                        1.87  cm/m^2 <=2.2 LA volume, S                          35.8  ml     ---------- LA volume/bsa, S                      19.1  ml/m^2 ---------- LA volume, ES, 1-p A4C                32.8  ml     ---------- LA volume/bsa, ES, 1-p A4C            17.5  ml/m^2 ---------- LA volume, ES, 1-p A2C                38.1  ml     ---------- LA volume/bsa, ES, 1-p A2C  20.3  ml/m^2 ----------  Mitral valve                          Value        Reference Mitral E-wave peak velocity           83.9  cm/s   ---------- Mitral A-wave peak velocity           86.8  cm/s   ---------- Mitral deceleration time       (H)    236   ms     150 - 230 Mitral peak gradient, D               3     mm Hg  ---------- Mitral E/A ratio, peak                1            ----------  Pulmonary arteries                    Value        Reference PA pressure, S, DP                    13    mm Hg  <=30  Tricuspid valve                       Value        Reference Tricuspid regurg peak velocity        158   cm/s   ---------- Tricuspid peak RV-RA gradient         10    mm Hg  ----------  Right atrium                          Value        Reference RA ID, S-I, ES, A4C                   43    mm     34 - 49 RA area, ES, A4C                      11.2  cm^2   8.3 - 19.5 RA volume, ES, A/L                    24    ml     ---------- RA volume/bsa, ES, A/L                 12.8  ml/m^2 ----------  Systemic veins                        Value        Reference Estimated CVP                         3     mm Hg  ----------  Right ventricle                       Value        Reference TAPSE                                 28.6  mm     ----------  RV pressure, S, DP                    13    mm Hg  <=30 RV s&', lateral, S                     12    cm/s   ----------  Legend: (L)  and  (H)  mark values outside specified reference range.  ------------------------------------------------------------------- Prepared and Electronically Authenticated by  Jennifer Crape, MD 2019-06-26T14:29:37    MONITORS  LONG TERM MONITOR (3-14 DAYS) 12/20/2017  Narrative Date of test:                 12/20/2017 to 01/03/2018 Duration of test:           14 days Indication:                    Palpitation abnormal EKG Ordering physician:       Dr. Monetta Interpreting physician:   Dr. Monetta  Baseline rhythm: Sinus rhythm  Minimum heart rate 63 average heart rate 93 maximal heart rate 148 bpm  Atrial arrhythmia: 2.4% SVE burden no episodes of atrial fibrillation flutter or SVT  Ventricular arrhythmia: Less than 1% ventricular ectopic burden there are total of 11 PVCs 1 couplet and one 6 beat run of PVCs at a rate of 171 bpm  Conduction abnormality: None no episodes of sinus node or AV block  Symptoms: 3 symptomatic events palpitation associated with frequent APCs   Conclusion: Symptomatic frequent APCs and one episode of 6 beat run of PVCs   CT SCANS  CT CORONARY MORPH W/CTA COR W/SCORE 02/19/2020  Addendum 02/19/2020  2:00 PM ADDENDUM REPORT: 02/19/2020 13:58  EXAM: Cardiac/Coronary  CT  TECHNIQUE: The patient was scanned on a [employer].  FINDINGS: A 120 kV prospective scan was triggered in the descending thoracic aorta at 111 HU's. Axial non-contrast 3 mm slices were carried out through the heart. The data set was analyzed on a  dedicated work station and scored using the Agatson method. Gantry rotation speed was 250 msecs and collimation was .6 mm. No beta blockade and 0.8 mg of sl NTG was given. The 3D data set was reconstructed in 5% intervals of the 67-82 % of the R-R cycle. Diastolic phases were analyzed on a dedicated work station using MPR, MIP and VRT modes. The patient received 80 cc of contrast.  Aorta: Normal size. Scattered calcifications of the ascending thoracic aorta. No dissection.  Aortic Valve:  Trileaflet.  No calcifications.  Coronary Arteries:  Normal coronary origin. Left dominance.  RCA is a small non-dominant artery that gives rise to a RPLA branch. There is no plaque.  Left main is avery short artery that gives rise to LAD and LCX arteries. There is no plaque.  LAD is a large vessel that has no plaque.  LCX is a dominant artery that gives rise to one large OM1 branch and the PDA. There is no plaque.  Other findings:  Normal pulmonary vein drainage into the left atrium.  Normal let atrial appendage without a thrombus.  Normal size of the pulmonary artery.  IMPRESSION: 1. Coronary calcium  score of 0. This was 0 percentile for age and sex matched control.  2. Normal coronary origin with right dominance.  3. No evidence of CAD.  CAD RADS 0.  4. Consider non-atherosclerotic causes of chest pain.  Traci Turner   Electronically Signed By: Wilbert  Turner On: 02/19/2020 13:58  Narrative EXAM: OVER-READ INTERPRETATION  CT CHEST  The following report is an over-read performed by radiologist Dr. Toribio Aye of [facility] on 02/19/2020. This over-read does not include interpretation of cardiac or coronary anatomy or pathology. The coronary calcium  score/coronary CTA interpretation by the cardiologist is attached.  COMPARISON:  None.  FINDINGS: Aortic atherosclerosis. Within the visualized portions of the thorax there are no suspicious appearing  pulmonary nodules or masses, there is no acute consolidative airspace disease, no pleural effusions, no pneumothorax and no lymphadenopathy. Visualized portions of the upper abdomen are unremarkable. There are no aggressive appearing lytic or blastic lesions noted in the visualized portions of the skeleton.  IMPRESSION: 1.  Aortic Atherosclerosis (ICD10-I70.0).  Electronically Signed: By: Toribio Aye M.D. On: 02/19/2020 13:04     ______________________________________________________________________________________________          Recent Labs: 01/31/2024: ALT 24; BUN 14; Creatinine, Ser 0.62; Hemoglobin 13.4; Platelets 442.0; Potassium 4.1; Sodium 138; TSH 1.56  Recent Lipid Panel    Component Value Date/Time   CHOL 253 (H) 01/31/2024 1305   TRIG 89.0 01/31/2024 1305   HDL 70.70 01/31/2024 1305   CHOLHDL 4 01/31/2024 1305   VLDL 17.8 01/31/2024 1305   LDLCALC 164 (H) 01/31/2024 1305   LDLDIRECT 161.6 03/23/2011 1331    Physical Exam:    VS:  There were no vitals taken for this visit.    Wt Readings from Last 3 Encounters:  03/07/24 171 lb 12.8 oz (77.9 kg)  01/31/24 169 lb 8 oz (76.9 kg)  10/19/23 170 lb 3.2 oz (77.2 kg)     GEN:  Well nourished, well developed in no acute distress HEENT: Normal NECK: No JVD; No carotid bruits LYMPHATICS: No lymphadenopathy CARDIAC: RRR, no murmurs, rubs, gallops RESPIRATORY:  Clear to auscultation without rales, wheezing or rhonchi  ABDOMEN: Soft, non-tender, non-distended MUSCULOSKELETAL:  No edema; No deformity  SKIN: Warm and dry NEUROLOGIC:  Alert and oriented x 3 PSYCHIATRIC:  Normal affect    Signed, Redell Leiter, MD  04/24/2024 8:43 AM    [facility]

## 2024-04-24 ENCOUNTER — Encounter: Payer: Self-pay | Admitting: Cardiology

## 2024-04-24 ENCOUNTER — Ambulatory Visit: Attending: Cardiology | Admitting: Cardiology

## 2024-04-24 VITALS — BP 120/72 | HR 76 | Ht 60.0 in | Wt 173.6 lb

## 2024-04-24 DIAGNOSIS — Z79899 Other long term (current) drug therapy: Secondary | ICD-10-CM

## 2024-04-24 DIAGNOSIS — I491 Atrial premature depolarization: Secondary | ICD-10-CM

## 2024-04-24 NOTE — Patient Instructions (Signed)

## 2024-07-09 ENCOUNTER — Other Ambulatory Visit: Payer: Self-pay | Admitting: Cardiology

## 2024-10-16 ENCOUNTER — Ambulatory Visit: Admitting: Allergy and Immunology

## 2025-01-31 ENCOUNTER — Encounter: Admitting: Family Medicine
